# Patient Record
Sex: Female | Born: 1935 | Race: White | Hispanic: No | Smoking: Former smoker
Health system: Southern US, Community
[De-identification: ages and names within clinical notes are randomized; demographics above are authoritative.]

## PROBLEM LIST (undated history)

## (undated) DIAGNOSIS — M810 Age-related osteoporosis without current pathological fracture: Secondary | ICD-10-CM

## (undated) DIAGNOSIS — I10 Essential (primary) hypertension: Secondary | ICD-10-CM

## (undated) DIAGNOSIS — E785 Hyperlipidemia, unspecified: Secondary | ICD-10-CM

## (undated) DIAGNOSIS — F0390 Unspecified dementia without behavioral disturbance: Secondary | ICD-10-CM

## (undated) DIAGNOSIS — W19XXXA Unspecified fall, initial encounter: Secondary | ICD-10-CM

## (undated) DIAGNOSIS — H3530 Unspecified macular degeneration: Secondary | ICD-10-CM

---

## 1997-03-29 ENCOUNTER — Ambulatory Visit (HOSPITAL_COMMUNITY): Admission: RE | Admit: 1997-03-29 | Discharge: 1997-03-29 | Payer: Self-pay | Admitting: *Deleted

## 1997-08-25 ENCOUNTER — Ambulatory Visit (HOSPITAL_COMMUNITY): Admission: RE | Admit: 1997-08-25 | Discharge: 1997-08-25 | Payer: Self-pay | Admitting: Obstetrics & Gynecology

## 1998-08-24 ENCOUNTER — Ambulatory Visit (HOSPITAL_COMMUNITY): Admission: RE | Admit: 1998-08-24 | Discharge: 1998-08-24 | Payer: Self-pay

## 1998-08-24 ENCOUNTER — Ambulatory Visit (HOSPITAL_COMMUNITY): Admission: RE | Admit: 1998-08-24 | Discharge: 1998-08-24 | Payer: Self-pay | Admitting: Obstetrics & Gynecology

## 1999-08-30 ENCOUNTER — Ambulatory Visit (HOSPITAL_COMMUNITY): Admission: RE | Admit: 1999-08-30 | Discharge: 1999-08-30 | Payer: Self-pay | Admitting: Obstetrics & Gynecology

## 2000-09-01 ENCOUNTER — Ambulatory Visit (HOSPITAL_COMMUNITY): Admission: RE | Admit: 2000-09-01 | Discharge: 2000-09-01 | Payer: Self-pay | Admitting: Internal Medicine

## 2000-09-01 ENCOUNTER — Encounter: Payer: Self-pay | Admitting: Internal Medicine

## 2000-09-18 ENCOUNTER — Encounter: Admission: RE | Admit: 2000-09-18 | Discharge: 2000-09-18 | Payer: Self-pay | Admitting: Internal Medicine

## 2000-09-18 ENCOUNTER — Encounter: Payer: Self-pay | Admitting: Internal Medicine

## 2000-09-18 ENCOUNTER — Ambulatory Visit (HOSPITAL_COMMUNITY): Admission: RE | Admit: 2000-09-18 | Discharge: 2000-09-18 | Payer: Self-pay | Admitting: *Deleted

## 2001-10-01 ENCOUNTER — Ambulatory Visit (HOSPITAL_COMMUNITY): Admission: RE | Admit: 2001-10-01 | Discharge: 2001-10-01 | Payer: Self-pay

## 2002-10-07 ENCOUNTER — Ambulatory Visit (HOSPITAL_COMMUNITY): Admission: RE | Admit: 2002-10-07 | Discharge: 2002-10-07 | Payer: Self-pay | Admitting: *Deleted

## 2003-11-27 ENCOUNTER — Ambulatory Visit: Payer: Self-pay | Admitting: Internal Medicine

## 2003-12-08 ENCOUNTER — Ambulatory Visit: Payer: Self-pay

## 2004-05-07 ENCOUNTER — Ambulatory Visit: Payer: Self-pay | Admitting: Internal Medicine

## 2004-12-06 ENCOUNTER — Ambulatory Visit: Payer: Self-pay

## 2005-01-03 ENCOUNTER — Ambulatory Visit: Payer: Self-pay | Admitting: Internal Medicine

## 2005-11-26 ENCOUNTER — Ambulatory Visit: Payer: Self-pay | Admitting: Internal Medicine

## 2006-01-15 ENCOUNTER — Ambulatory Visit: Payer: Self-pay | Admitting: Internal Medicine

## 2006-01-26 ENCOUNTER — Ambulatory Visit: Payer: Self-pay | Admitting: Internal Medicine

## 2006-08-31 ENCOUNTER — Ambulatory Visit: Payer: Self-pay | Admitting: Internal Medicine

## 2007-06-03 ENCOUNTER — Ambulatory Visit: Payer: Self-pay | Admitting: Internal Medicine

## 2008-08-04 ENCOUNTER — Ambulatory Visit: Payer: Self-pay | Admitting: Internal Medicine

## 2010-03-23 ENCOUNTER — Inpatient Hospital Stay: Payer: Self-pay | Admitting: Orthopedic Surgery

## 2014-01-24 ENCOUNTER — Emergency Department: Payer: Self-pay | Admitting: Emergency Medicine

## 2014-01-24 LAB — BASIC METABOLIC PANEL
Anion Gap: 7 (ref 7–16)
BUN: 16 mg/dL (ref 7–18)
Calcium, Total: 7.3 mg/dL — ABNORMAL LOW (ref 8.5–10.1)
Chloride: 111 mmol/L — ABNORMAL HIGH (ref 98–107)
Co2: 25 mmol/L (ref 21–32)
Creatinine: 0.52 mg/dL — ABNORMAL LOW (ref 0.60–1.30)
EGFR (African American): >60
EGFR (Non-African Amer.): >60
Glucose: 117 mg/dL — ABNORMAL HIGH (ref 65–99)
Osmolality: 287 (ref 275–301)
Potassium: 4.5 mmol/L (ref 3.5–5.1)
Sodium: 143 mmol/L (ref 136–145)

## 2014-01-24 LAB — URINALYSIS, COMPLETE
Bilirubin,UR: NEGATIVE
Blood: NEGATIVE
Glucose,UR: NEGATIVE mg/dL (ref 0–75)
Ketone: NEGATIVE
Nitrite: POSITIVE
Ph: 7 (ref 4.5–8.0)
Protein: NEGATIVE
RBC,UR: 1 /HPF (ref 0–5)
Specific Gravity: 1.011 (ref 1.003–1.030)
Squamous Epithelial: <1
WBC UR: 21 /HPF (ref 0–5)

## 2014-01-24 LAB — CBC WITH DIFFERENTIAL/PLATELET
Basophil #: 0 10*3/uL (ref 0.0–0.1)
Basophil %: 0.4 %
Eosinophil #: 0.1 10*3/uL (ref 0.0–0.7)
Eosinophil %: 0.5 %
HCT: 41.5 % (ref 35.0–47.0)
HGB: 13.5 g/dL (ref 12.0–16.0)
Lymphocyte #: 1 10*3/uL (ref 1.0–3.6)
Lymphocyte %: 7.4 %
MCH: 31.2 pg (ref 26.0–34.0)
MCHC: 32.5 g/dL (ref 32.0–36.0)
MCV: 96 fL (ref 80–100)
Monocyte #: 0.7 x10 3/mm (ref 0.2–0.9)
Monocyte %: 5 %
Neutrophil #: 11.7 10*3/uL — ABNORMAL HIGH (ref 1.4–6.5)
Neutrophil %: 86.7 %
Platelet: 245 10*3/uL (ref 150–440)
RBC: 4.33 10*6/uL (ref 3.80–5.20)
RDW: 13.5 % (ref 11.5–14.5)
WBC: 13.5 10*3/uL — ABNORMAL HIGH (ref 3.6–11.0)

## 2014-01-24 LAB — TROPONIN I
Troponin-I: 0.03 ng/mL
Troponin-I: 0.03 ng/mL

## 2014-01-26 LAB — URINE CULTURE

## 2014-02-17 DIAGNOSIS — G933 Postviral fatigue syndrome: Secondary | ICD-10-CM | POA: Diagnosis not present

## 2014-02-18 DIAGNOSIS — G933 Postviral fatigue syndrome: Secondary | ICD-10-CM | POA: Diagnosis not present

## 2014-02-19 DIAGNOSIS — G933 Postviral fatigue syndrome: Secondary | ICD-10-CM | POA: Diagnosis not present

## 2014-02-20 DIAGNOSIS — G933 Postviral fatigue syndrome: Secondary | ICD-10-CM | POA: Diagnosis not present

## 2014-02-21 DIAGNOSIS — G933 Postviral fatigue syndrome: Secondary | ICD-10-CM | POA: Diagnosis not present

## 2014-02-22 DIAGNOSIS — G933 Postviral fatigue syndrome: Secondary | ICD-10-CM | POA: Diagnosis not present

## 2014-02-23 DIAGNOSIS — G933 Postviral fatigue syndrome: Secondary | ICD-10-CM | POA: Diagnosis not present

## 2014-02-24 DIAGNOSIS — S52531D Colles' fracture of right radius, subsequent encounter for closed fracture with routine healing: Secondary | ICD-10-CM | POA: Diagnosis not present

## 2014-02-24 DIAGNOSIS — G933 Postviral fatigue syndrome: Secondary | ICD-10-CM | POA: Diagnosis not present

## 2014-02-25 DIAGNOSIS — G933 Postviral fatigue syndrome: Secondary | ICD-10-CM | POA: Diagnosis not present

## 2014-02-26 DIAGNOSIS — G933 Postviral fatigue syndrome: Secondary | ICD-10-CM | POA: Diagnosis not present

## 2014-02-27 DIAGNOSIS — G933 Postviral fatigue syndrome: Secondary | ICD-10-CM | POA: Diagnosis not present

## 2014-02-28 DIAGNOSIS — G933 Postviral fatigue syndrome: Secondary | ICD-10-CM | POA: Diagnosis not present

## 2014-03-01 DIAGNOSIS — G933 Postviral fatigue syndrome: Secondary | ICD-10-CM | POA: Diagnosis not present

## 2014-03-02 DIAGNOSIS — G933 Postviral fatigue syndrome: Secondary | ICD-10-CM | POA: Diagnosis not present

## 2014-03-03 DIAGNOSIS — G933 Postviral fatigue syndrome: Secondary | ICD-10-CM | POA: Diagnosis not present

## 2014-03-04 DIAGNOSIS — G933 Postviral fatigue syndrome: Secondary | ICD-10-CM | POA: Diagnosis not present

## 2014-03-05 DIAGNOSIS — G933 Postviral fatigue syndrome: Secondary | ICD-10-CM | POA: Diagnosis not present

## 2014-03-06 DIAGNOSIS — G933 Postviral fatigue syndrome: Secondary | ICD-10-CM | POA: Diagnosis not present

## 2014-03-07 DIAGNOSIS — G933 Postviral fatigue syndrome: Secondary | ICD-10-CM | POA: Diagnosis not present

## 2014-03-08 DIAGNOSIS — G933 Postviral fatigue syndrome: Secondary | ICD-10-CM | POA: Diagnosis not present

## 2014-03-09 DIAGNOSIS — G933 Postviral fatigue syndrome: Secondary | ICD-10-CM | POA: Diagnosis not present

## 2014-03-10 DIAGNOSIS — G933 Postviral fatigue syndrome: Secondary | ICD-10-CM | POA: Diagnosis not present

## 2014-03-11 DIAGNOSIS — G933 Postviral fatigue syndrome: Secondary | ICD-10-CM | POA: Diagnosis not present

## 2014-03-12 DIAGNOSIS — G933 Postviral fatigue syndrome: Secondary | ICD-10-CM | POA: Diagnosis not present

## 2014-03-13 ENCOUNTER — Emergency Department: Payer: Self-pay | Admitting: Emergency Medicine

## 2014-03-13 DIAGNOSIS — I10 Essential (primary) hypertension: Secondary | ICD-10-CM | POA: Diagnosis not present

## 2014-03-13 DIAGNOSIS — G933 Postviral fatigue syndrome: Secondary | ICD-10-CM | POA: Diagnosis not present

## 2014-03-13 DIAGNOSIS — S2241XA Multiple fractures of ribs, right side, initial encounter for closed fracture: Secondary | ICD-10-CM | POA: Diagnosis not present

## 2014-03-14 DIAGNOSIS — G933 Postviral fatigue syndrome: Secondary | ICD-10-CM | POA: Diagnosis not present

## 2014-03-15 DIAGNOSIS — G933 Postviral fatigue syndrome: Secondary | ICD-10-CM | POA: Diagnosis not present

## 2014-03-16 DIAGNOSIS — G933 Postviral fatigue syndrome: Secondary | ICD-10-CM | POA: Diagnosis not present

## 2014-03-17 DIAGNOSIS — G933 Postviral fatigue syndrome: Secondary | ICD-10-CM | POA: Diagnosis not present

## 2014-03-18 DIAGNOSIS — G933 Postviral fatigue syndrome: Secondary | ICD-10-CM | POA: Diagnosis not present

## 2014-03-19 DIAGNOSIS — G933 Postviral fatigue syndrome: Secondary | ICD-10-CM | POA: Diagnosis not present

## 2014-03-20 DIAGNOSIS — G933 Postviral fatigue syndrome: Secondary | ICD-10-CM | POA: Diagnosis not present

## 2014-03-21 DIAGNOSIS — G933 Postviral fatigue syndrome: Secondary | ICD-10-CM | POA: Diagnosis not present

## 2014-03-22 DIAGNOSIS — G933 Postviral fatigue syndrome: Secondary | ICD-10-CM | POA: Diagnosis not present

## 2014-03-23 DIAGNOSIS — G933 Postviral fatigue syndrome: Secondary | ICD-10-CM | POA: Diagnosis not present

## 2014-03-24 DIAGNOSIS — G933 Postviral fatigue syndrome: Secondary | ICD-10-CM | POA: Diagnosis not present

## 2014-03-25 DIAGNOSIS — G933 Postviral fatigue syndrome: Secondary | ICD-10-CM | POA: Diagnosis not present

## 2014-03-26 DIAGNOSIS — G933 Postviral fatigue syndrome: Secondary | ICD-10-CM | POA: Diagnosis not present

## 2014-03-27 DIAGNOSIS — G933 Postviral fatigue syndrome: Secondary | ICD-10-CM | POA: Diagnosis not present

## 2014-03-28 DIAGNOSIS — G933 Postviral fatigue syndrome: Secondary | ICD-10-CM | POA: Diagnosis not present

## 2014-03-29 DIAGNOSIS — G933 Postviral fatigue syndrome: Secondary | ICD-10-CM | POA: Diagnosis not present

## 2014-03-30 DIAGNOSIS — G933 Postviral fatigue syndrome: Secondary | ICD-10-CM | POA: Diagnosis not present

## 2014-03-31 DIAGNOSIS — G933 Postviral fatigue syndrome: Secondary | ICD-10-CM | POA: Diagnosis not present

## 2014-04-01 DIAGNOSIS — G933 Postviral fatigue syndrome: Secondary | ICD-10-CM | POA: Diagnosis not present

## 2014-04-02 DIAGNOSIS — G933 Postviral fatigue syndrome: Secondary | ICD-10-CM | POA: Diagnosis not present

## 2014-04-03 DIAGNOSIS — G933 Postviral fatigue syndrome: Secondary | ICD-10-CM | POA: Diagnosis not present

## 2014-04-04 DIAGNOSIS — G933 Postviral fatigue syndrome: Secondary | ICD-10-CM | POA: Diagnosis not present

## 2014-04-05 DIAGNOSIS — G933 Postviral fatigue syndrome: Secondary | ICD-10-CM | POA: Diagnosis not present

## 2014-04-06 DIAGNOSIS — G933 Postviral fatigue syndrome: Secondary | ICD-10-CM | POA: Diagnosis not present

## 2014-04-07 DIAGNOSIS — G933 Postviral fatigue syndrome: Secondary | ICD-10-CM | POA: Diagnosis not present

## 2014-04-08 DIAGNOSIS — G933 Postviral fatigue syndrome: Secondary | ICD-10-CM | POA: Diagnosis not present

## 2014-04-09 DIAGNOSIS — G933 Postviral fatigue syndrome: Secondary | ICD-10-CM | POA: Diagnosis not present

## 2014-04-10 DIAGNOSIS — G933 Postviral fatigue syndrome: Secondary | ICD-10-CM | POA: Diagnosis not present

## 2014-04-11 DIAGNOSIS — G933 Postviral fatigue syndrome: Secondary | ICD-10-CM | POA: Diagnosis not present

## 2014-04-12 DIAGNOSIS — G933 Postviral fatigue syndrome: Secondary | ICD-10-CM | POA: Diagnosis not present

## 2014-04-13 DIAGNOSIS — G933 Postviral fatigue syndrome: Secondary | ICD-10-CM | POA: Diagnosis not present

## 2014-04-14 DIAGNOSIS — S52531D Colles' fracture of right radius, subsequent encounter for closed fracture with routine healing: Secondary | ICD-10-CM | POA: Diagnosis not present

## 2014-04-14 DIAGNOSIS — G933 Postviral fatigue syndrome: Secondary | ICD-10-CM | POA: Diagnosis not present

## 2014-04-15 DIAGNOSIS — G933 Postviral fatigue syndrome: Secondary | ICD-10-CM | POA: Diagnosis not present

## 2014-04-16 DIAGNOSIS — G933 Postviral fatigue syndrome: Secondary | ICD-10-CM | POA: Diagnosis not present

## 2014-04-17 DIAGNOSIS — G933 Postviral fatigue syndrome: Secondary | ICD-10-CM | POA: Diagnosis not present

## 2014-04-18 DIAGNOSIS — G933 Postviral fatigue syndrome: Secondary | ICD-10-CM | POA: Diagnosis not present

## 2014-04-19 DIAGNOSIS — G933 Postviral fatigue syndrome: Secondary | ICD-10-CM | POA: Diagnosis not present

## 2014-04-20 DIAGNOSIS — E039 Hypothyroidism, unspecified: Secondary | ICD-10-CM | POA: Diagnosis not present

## 2014-04-20 DIAGNOSIS — I10 Essential (primary) hypertension: Secondary | ICD-10-CM | POA: Diagnosis not present

## 2014-04-20 DIAGNOSIS — F0390 Unspecified dementia without behavioral disturbance: Secondary | ICD-10-CM | POA: Diagnosis not present

## 2014-04-20 DIAGNOSIS — E782 Mixed hyperlipidemia: Secondary | ICD-10-CM | POA: Diagnosis not present

## 2014-04-20 DIAGNOSIS — M810 Age-related osteoporosis without current pathological fracture: Secondary | ICD-10-CM | POA: Diagnosis not present

## 2014-04-20 DIAGNOSIS — G933 Postviral fatigue syndrome: Secondary | ICD-10-CM | POA: Diagnosis not present

## 2014-04-21 DIAGNOSIS — G933 Postviral fatigue syndrome: Secondary | ICD-10-CM | POA: Diagnosis not present

## 2014-04-22 DIAGNOSIS — G933 Postviral fatigue syndrome: Secondary | ICD-10-CM | POA: Diagnosis not present

## 2014-04-23 DIAGNOSIS — G933 Postviral fatigue syndrome: Secondary | ICD-10-CM | POA: Diagnosis not present

## 2014-04-24 DIAGNOSIS — G933 Postviral fatigue syndrome: Secondary | ICD-10-CM | POA: Diagnosis not present

## 2014-04-25 DIAGNOSIS — G933 Postviral fatigue syndrome: Secondary | ICD-10-CM | POA: Diagnosis not present

## 2014-04-26 DIAGNOSIS — G933 Postviral fatigue syndrome: Secondary | ICD-10-CM | POA: Diagnosis not present

## 2014-04-27 DIAGNOSIS — G933 Postviral fatigue syndrome: Secondary | ICD-10-CM | POA: Diagnosis not present

## 2014-04-27 DIAGNOSIS — H3530 Unspecified macular degeneration: Secondary | ICD-10-CM | POA: Diagnosis not present

## 2014-04-27 DIAGNOSIS — I10 Essential (primary) hypertension: Secondary | ICD-10-CM | POA: Diagnosis not present

## 2014-04-27 DIAGNOSIS — F0390 Unspecified dementia without behavioral disturbance: Secondary | ICD-10-CM | POA: Diagnosis not present

## 2014-04-27 DIAGNOSIS — E039 Hypothyroidism, unspecified: Secondary | ICD-10-CM | POA: Diagnosis not present

## 2014-04-28 DIAGNOSIS — G933 Postviral fatigue syndrome: Secondary | ICD-10-CM | POA: Diagnosis not present

## 2014-04-29 DIAGNOSIS — G933 Postviral fatigue syndrome: Secondary | ICD-10-CM | POA: Diagnosis not present

## 2014-04-30 DIAGNOSIS — G933 Postviral fatigue syndrome: Secondary | ICD-10-CM | POA: Diagnosis not present

## 2014-05-01 DIAGNOSIS — G933 Postviral fatigue syndrome: Secondary | ICD-10-CM | POA: Diagnosis not present

## 2014-05-02 DIAGNOSIS — G933 Postviral fatigue syndrome: Secondary | ICD-10-CM | POA: Diagnosis not present

## 2014-05-03 DIAGNOSIS — G933 Postviral fatigue syndrome: Secondary | ICD-10-CM | POA: Diagnosis not present

## 2014-05-04 DIAGNOSIS — G933 Postviral fatigue syndrome: Secondary | ICD-10-CM | POA: Diagnosis not present

## 2014-05-05 DIAGNOSIS — F0390 Unspecified dementia without behavioral disturbance: Secondary | ICD-10-CM | POA: Diagnosis not present

## 2014-05-05 DIAGNOSIS — G933 Postviral fatigue syndrome: Secondary | ICD-10-CM | POA: Diagnosis not present

## 2014-05-05 DIAGNOSIS — R32 Unspecified urinary incontinence: Secondary | ICD-10-CM | POA: Diagnosis not present

## 2014-05-06 DIAGNOSIS — G933 Postviral fatigue syndrome: Secondary | ICD-10-CM | POA: Diagnosis not present

## 2014-05-07 DIAGNOSIS — G933 Postviral fatigue syndrome: Secondary | ICD-10-CM | POA: Diagnosis not present

## 2014-05-08 DIAGNOSIS — G933 Postviral fatigue syndrome: Secondary | ICD-10-CM | POA: Diagnosis not present

## 2014-05-09 DIAGNOSIS — G933 Postviral fatigue syndrome: Secondary | ICD-10-CM | POA: Diagnosis not present

## 2014-05-10 DIAGNOSIS — G933 Postviral fatigue syndrome: Secondary | ICD-10-CM | POA: Diagnosis not present

## 2014-05-11 DIAGNOSIS — G933 Postviral fatigue syndrome: Secondary | ICD-10-CM | POA: Diagnosis not present

## 2014-05-12 DIAGNOSIS — G933 Postviral fatigue syndrome: Secondary | ICD-10-CM | POA: Diagnosis not present

## 2014-05-13 DIAGNOSIS — G933 Postviral fatigue syndrome: Secondary | ICD-10-CM | POA: Diagnosis not present

## 2014-05-14 DIAGNOSIS — G933 Postviral fatigue syndrome: Secondary | ICD-10-CM | POA: Diagnosis not present

## 2014-05-15 DIAGNOSIS — G933 Postviral fatigue syndrome: Secondary | ICD-10-CM | POA: Diagnosis not present

## 2014-05-16 DIAGNOSIS — G933 Postviral fatigue syndrome: Secondary | ICD-10-CM | POA: Diagnosis not present

## 2014-05-17 DIAGNOSIS — G933 Postviral fatigue syndrome: Secondary | ICD-10-CM | POA: Diagnosis not present

## 2014-05-18 DIAGNOSIS — G933 Postviral fatigue syndrome: Secondary | ICD-10-CM | POA: Diagnosis not present

## 2014-05-19 DIAGNOSIS — G933 Postviral fatigue syndrome: Secondary | ICD-10-CM | POA: Diagnosis not present

## 2014-05-20 DIAGNOSIS — G933 Postviral fatigue syndrome: Secondary | ICD-10-CM | POA: Diagnosis not present

## 2014-05-21 DIAGNOSIS — G933 Postviral fatigue syndrome: Secondary | ICD-10-CM | POA: Diagnosis not present

## 2014-05-22 DIAGNOSIS — G933 Postviral fatigue syndrome: Secondary | ICD-10-CM | POA: Diagnosis not present

## 2014-05-23 DIAGNOSIS — G933 Postviral fatigue syndrome: Secondary | ICD-10-CM | POA: Diagnosis not present

## 2014-05-24 DIAGNOSIS — G933 Postviral fatigue syndrome: Secondary | ICD-10-CM | POA: Diagnosis not present

## 2014-05-25 DIAGNOSIS — G933 Postviral fatigue syndrome: Secondary | ICD-10-CM | POA: Diagnosis not present

## 2014-05-26 DIAGNOSIS — G933 Postviral fatigue syndrome: Secondary | ICD-10-CM | POA: Diagnosis not present

## 2014-05-27 DIAGNOSIS — G933 Postviral fatigue syndrome: Secondary | ICD-10-CM | POA: Diagnosis not present

## 2014-05-28 DIAGNOSIS — G933 Postviral fatigue syndrome: Secondary | ICD-10-CM | POA: Diagnosis not present

## 2014-05-29 DIAGNOSIS — G933 Postviral fatigue syndrome: Secondary | ICD-10-CM | POA: Diagnosis not present

## 2014-05-30 DIAGNOSIS — G933 Postviral fatigue syndrome: Secondary | ICD-10-CM | POA: Diagnosis not present

## 2014-05-31 DIAGNOSIS — G933 Postviral fatigue syndrome: Secondary | ICD-10-CM | POA: Diagnosis not present

## 2014-06-01 DIAGNOSIS — G933 Postviral fatigue syndrome: Secondary | ICD-10-CM | POA: Diagnosis not present

## 2014-06-02 DIAGNOSIS — G933 Postviral fatigue syndrome: Secondary | ICD-10-CM | POA: Diagnosis not present

## 2014-06-03 DIAGNOSIS — G933 Postviral fatigue syndrome: Secondary | ICD-10-CM | POA: Diagnosis not present

## 2014-06-04 DIAGNOSIS — G933 Postviral fatigue syndrome: Secondary | ICD-10-CM | POA: Diagnosis not present

## 2014-06-05 DIAGNOSIS — G933 Postviral fatigue syndrome: Secondary | ICD-10-CM | POA: Diagnosis not present

## 2014-06-06 DIAGNOSIS — G933 Postviral fatigue syndrome: Secondary | ICD-10-CM | POA: Diagnosis not present

## 2014-06-07 DIAGNOSIS — G933 Postviral fatigue syndrome: Secondary | ICD-10-CM | POA: Diagnosis not present

## 2014-06-08 DIAGNOSIS — G933 Postviral fatigue syndrome: Secondary | ICD-10-CM | POA: Diagnosis not present

## 2014-06-09 DIAGNOSIS — G933 Postviral fatigue syndrome: Secondary | ICD-10-CM | POA: Diagnosis not present

## 2014-06-10 DIAGNOSIS — G933 Postviral fatigue syndrome: Secondary | ICD-10-CM | POA: Diagnosis not present

## 2014-06-11 DIAGNOSIS — G933 Postviral fatigue syndrome: Secondary | ICD-10-CM | POA: Diagnosis not present

## 2014-06-12 DIAGNOSIS — G933 Postviral fatigue syndrome: Secondary | ICD-10-CM | POA: Diagnosis not present

## 2014-06-12 DIAGNOSIS — N390 Urinary tract infection, site not specified: Secondary | ICD-10-CM | POA: Diagnosis not present

## 2014-06-12 DIAGNOSIS — R410 Disorientation, unspecified: Secondary | ICD-10-CM | POA: Diagnosis not present

## 2014-06-13 DIAGNOSIS — G933 Postviral fatigue syndrome: Secondary | ICD-10-CM | POA: Diagnosis not present

## 2014-06-14 DIAGNOSIS — G933 Postviral fatigue syndrome: Secondary | ICD-10-CM | POA: Diagnosis not present

## 2014-06-15 DIAGNOSIS — G933 Postviral fatigue syndrome: Secondary | ICD-10-CM | POA: Diagnosis not present

## 2014-06-16 DIAGNOSIS — G933 Postviral fatigue syndrome: Secondary | ICD-10-CM | POA: Diagnosis not present

## 2014-06-17 DIAGNOSIS — G933 Postviral fatigue syndrome: Secondary | ICD-10-CM | POA: Diagnosis not present

## 2014-06-18 DIAGNOSIS — G933 Postviral fatigue syndrome: Secondary | ICD-10-CM | POA: Diagnosis not present

## 2014-06-19 DIAGNOSIS — G933 Postviral fatigue syndrome: Secondary | ICD-10-CM | POA: Diagnosis not present

## 2014-06-20 DIAGNOSIS — G933 Postviral fatigue syndrome: Secondary | ICD-10-CM | POA: Diagnosis not present

## 2014-06-21 DIAGNOSIS — G933 Postviral fatigue syndrome: Secondary | ICD-10-CM | POA: Diagnosis not present

## 2014-06-22 DIAGNOSIS — G933 Postviral fatigue syndrome: Secondary | ICD-10-CM | POA: Diagnosis not present

## 2014-06-23 DIAGNOSIS — G933 Postviral fatigue syndrome: Secondary | ICD-10-CM | POA: Diagnosis not present

## 2014-06-24 DIAGNOSIS — G933 Postviral fatigue syndrome: Secondary | ICD-10-CM | POA: Diagnosis not present

## 2014-06-25 DIAGNOSIS — G933 Postviral fatigue syndrome: Secondary | ICD-10-CM | POA: Diagnosis not present

## 2014-06-26 DIAGNOSIS — G933 Postviral fatigue syndrome: Secondary | ICD-10-CM | POA: Diagnosis not present

## 2014-06-27 DIAGNOSIS — G933 Postviral fatigue syndrome: Secondary | ICD-10-CM | POA: Diagnosis not present

## 2014-06-28 DIAGNOSIS — G933 Postviral fatigue syndrome: Secondary | ICD-10-CM | POA: Diagnosis not present

## 2014-06-29 DIAGNOSIS — G933 Postviral fatigue syndrome: Secondary | ICD-10-CM | POA: Diagnosis not present

## 2014-06-30 DIAGNOSIS — G933 Postviral fatigue syndrome: Secondary | ICD-10-CM | POA: Diagnosis not present

## 2014-06-30 DIAGNOSIS — H3531 Nonexudative age-related macular degeneration: Secondary | ICD-10-CM | POA: Diagnosis not present

## 2014-07-01 DIAGNOSIS — G933 Postviral fatigue syndrome: Secondary | ICD-10-CM | POA: Diagnosis not present

## 2014-07-02 DIAGNOSIS — G933 Postviral fatigue syndrome: Secondary | ICD-10-CM | POA: Diagnosis not present

## 2014-07-03 DIAGNOSIS — G933 Postviral fatigue syndrome: Secondary | ICD-10-CM | POA: Diagnosis not present

## 2014-07-04 DIAGNOSIS — G933 Postviral fatigue syndrome: Secondary | ICD-10-CM | POA: Diagnosis not present

## 2014-07-05 DIAGNOSIS — G933 Postviral fatigue syndrome: Secondary | ICD-10-CM | POA: Diagnosis not present

## 2014-07-06 DIAGNOSIS — G933 Postviral fatigue syndrome: Secondary | ICD-10-CM | POA: Diagnosis not present

## 2014-07-07 DIAGNOSIS — G933 Postviral fatigue syndrome: Secondary | ICD-10-CM | POA: Diagnosis not present

## 2014-07-08 DIAGNOSIS — G933 Postviral fatigue syndrome: Secondary | ICD-10-CM | POA: Diagnosis not present

## 2014-07-09 DIAGNOSIS — G933 Postviral fatigue syndrome: Secondary | ICD-10-CM | POA: Diagnosis not present

## 2014-07-10 DIAGNOSIS — G933 Postviral fatigue syndrome: Secondary | ICD-10-CM | POA: Diagnosis not present

## 2014-07-11 DIAGNOSIS — G933 Postviral fatigue syndrome: Secondary | ICD-10-CM | POA: Diagnosis not present

## 2014-07-12 DIAGNOSIS — G933 Postviral fatigue syndrome: Secondary | ICD-10-CM | POA: Diagnosis not present

## 2014-07-13 DIAGNOSIS — G933 Postviral fatigue syndrome: Secondary | ICD-10-CM | POA: Diagnosis not present

## 2014-07-14 DIAGNOSIS — G933 Postviral fatigue syndrome: Secondary | ICD-10-CM | POA: Diagnosis not present

## 2014-07-15 DIAGNOSIS — G933 Postviral fatigue syndrome: Secondary | ICD-10-CM | POA: Diagnosis not present

## 2014-07-16 DIAGNOSIS — G933 Postviral fatigue syndrome: Secondary | ICD-10-CM | POA: Diagnosis not present

## 2014-07-17 DIAGNOSIS — G933 Postviral fatigue syndrome: Secondary | ICD-10-CM | POA: Diagnosis not present

## 2014-07-18 DIAGNOSIS — G933 Postviral fatigue syndrome: Secondary | ICD-10-CM | POA: Diagnosis not present

## 2014-07-19 DIAGNOSIS — G933 Postviral fatigue syndrome: Secondary | ICD-10-CM | POA: Diagnosis not present

## 2014-07-20 DIAGNOSIS — G933 Postviral fatigue syndrome: Secondary | ICD-10-CM | POA: Diagnosis not present

## 2014-07-21 DIAGNOSIS — G933 Postviral fatigue syndrome: Secondary | ICD-10-CM | POA: Diagnosis not present

## 2014-07-22 DIAGNOSIS — G933 Postviral fatigue syndrome: Secondary | ICD-10-CM | POA: Diagnosis not present

## 2014-07-23 DIAGNOSIS — G933 Postviral fatigue syndrome: Secondary | ICD-10-CM | POA: Diagnosis not present

## 2014-07-24 DIAGNOSIS — G933 Postviral fatigue syndrome: Secondary | ICD-10-CM | POA: Diagnosis not present

## 2014-07-25 DIAGNOSIS — G933 Postviral fatigue syndrome: Secondary | ICD-10-CM | POA: Diagnosis not present

## 2014-07-26 DIAGNOSIS — G933 Postviral fatigue syndrome: Secondary | ICD-10-CM | POA: Diagnosis not present

## 2014-07-27 DIAGNOSIS — G933 Postviral fatigue syndrome: Secondary | ICD-10-CM | POA: Diagnosis not present

## 2014-07-28 DIAGNOSIS — G933 Postviral fatigue syndrome: Secondary | ICD-10-CM | POA: Diagnosis not present

## 2014-07-29 DIAGNOSIS — G933 Postviral fatigue syndrome: Secondary | ICD-10-CM | POA: Diagnosis not present

## 2014-07-30 DIAGNOSIS — G933 Postviral fatigue syndrome: Secondary | ICD-10-CM | POA: Diagnosis not present

## 2014-07-31 DIAGNOSIS — G933 Postviral fatigue syndrome: Secondary | ICD-10-CM | POA: Diagnosis not present

## 2014-08-01 DIAGNOSIS — G933 Postviral fatigue syndrome: Secondary | ICD-10-CM | POA: Diagnosis not present

## 2014-08-02 DIAGNOSIS — G933 Postviral fatigue syndrome: Secondary | ICD-10-CM | POA: Diagnosis not present

## 2014-08-02 DIAGNOSIS — Z209 Contact with and (suspected) exposure to unspecified communicable disease: Secondary | ICD-10-CM | POA: Diagnosis not present

## 2014-08-03 DIAGNOSIS — G933 Postviral fatigue syndrome: Secondary | ICD-10-CM | POA: Diagnosis not present

## 2014-08-04 DIAGNOSIS — G933 Postviral fatigue syndrome: Secondary | ICD-10-CM | POA: Diagnosis not present

## 2014-08-05 DIAGNOSIS — G933 Postviral fatigue syndrome: Secondary | ICD-10-CM | POA: Diagnosis not present

## 2014-08-06 DIAGNOSIS — G933 Postviral fatigue syndrome: Secondary | ICD-10-CM | POA: Diagnosis not present

## 2014-08-07 DIAGNOSIS — G933 Postviral fatigue syndrome: Secondary | ICD-10-CM | POA: Diagnosis not present

## 2014-08-08 DIAGNOSIS — G933 Postviral fatigue syndrome: Secondary | ICD-10-CM | POA: Diagnosis not present

## 2014-08-08 DIAGNOSIS — R32 Unspecified urinary incontinence: Secondary | ICD-10-CM | POA: Diagnosis not present

## 2014-08-08 DIAGNOSIS — F0390 Unspecified dementia without behavioral disturbance: Secondary | ICD-10-CM | POA: Diagnosis not present

## 2014-08-09 DIAGNOSIS — G933 Postviral fatigue syndrome: Secondary | ICD-10-CM | POA: Diagnosis not present

## 2014-08-10 DIAGNOSIS — G933 Postviral fatigue syndrome: Secondary | ICD-10-CM | POA: Diagnosis not present

## 2014-08-11 DIAGNOSIS — G933 Postviral fatigue syndrome: Secondary | ICD-10-CM | POA: Diagnosis not present

## 2014-08-12 DIAGNOSIS — G933 Postviral fatigue syndrome: Secondary | ICD-10-CM | POA: Diagnosis not present

## 2014-08-13 DIAGNOSIS — G933 Postviral fatigue syndrome: Secondary | ICD-10-CM | POA: Diagnosis not present

## 2014-08-14 DIAGNOSIS — G933 Postviral fatigue syndrome: Secondary | ICD-10-CM | POA: Diagnosis not present

## 2014-08-15 DIAGNOSIS — G933 Postviral fatigue syndrome: Secondary | ICD-10-CM | POA: Diagnosis not present

## 2014-08-16 DIAGNOSIS — G933 Postviral fatigue syndrome: Secondary | ICD-10-CM | POA: Diagnosis not present

## 2014-08-17 DIAGNOSIS — G933 Postviral fatigue syndrome: Secondary | ICD-10-CM | POA: Diagnosis not present

## 2014-08-18 DIAGNOSIS — G933 Postviral fatigue syndrome: Secondary | ICD-10-CM | POA: Diagnosis not present

## 2014-08-19 DIAGNOSIS — G933 Postviral fatigue syndrome: Secondary | ICD-10-CM | POA: Diagnosis not present

## 2014-08-20 DIAGNOSIS — G933 Postviral fatigue syndrome: Secondary | ICD-10-CM | POA: Diagnosis not present

## 2014-08-21 DIAGNOSIS — G933 Postviral fatigue syndrome: Secondary | ICD-10-CM | POA: Diagnosis not present

## 2014-08-22 DIAGNOSIS — G933 Postviral fatigue syndrome: Secondary | ICD-10-CM | POA: Diagnosis not present

## 2014-08-23 DIAGNOSIS — G933 Postviral fatigue syndrome: Secondary | ICD-10-CM | POA: Diagnosis not present

## 2014-08-24 DIAGNOSIS — G933 Postviral fatigue syndrome: Secondary | ICD-10-CM | POA: Diagnosis not present

## 2014-08-25 DIAGNOSIS — G933 Postviral fatigue syndrome: Secondary | ICD-10-CM | POA: Diagnosis not present

## 2014-08-26 DIAGNOSIS — G933 Postviral fatigue syndrome: Secondary | ICD-10-CM | POA: Diagnosis not present

## 2014-08-27 DIAGNOSIS — G933 Postviral fatigue syndrome: Secondary | ICD-10-CM | POA: Diagnosis not present

## 2014-08-28 DIAGNOSIS — G933 Postviral fatigue syndrome: Secondary | ICD-10-CM | POA: Diagnosis not present

## 2014-08-29 DIAGNOSIS — G933 Postviral fatigue syndrome: Secondary | ICD-10-CM | POA: Diagnosis not present

## 2014-08-29 DIAGNOSIS — F0390 Unspecified dementia without behavioral disturbance: Secondary | ICD-10-CM | POA: Diagnosis not present

## 2014-08-29 DIAGNOSIS — E782 Mixed hyperlipidemia: Secondary | ICD-10-CM | POA: Diagnosis not present

## 2014-08-29 DIAGNOSIS — R05 Cough: Secondary | ICD-10-CM | POA: Diagnosis not present

## 2014-08-29 DIAGNOSIS — R0689 Other abnormalities of breathing: Secondary | ICD-10-CM | POA: Diagnosis not present

## 2014-08-29 DIAGNOSIS — I10 Essential (primary) hypertension: Secondary | ICD-10-CM | POA: Diagnosis not present

## 2014-08-29 DIAGNOSIS — D649 Anemia, unspecified: Secondary | ICD-10-CM | POA: Diagnosis not present

## 2014-08-30 DIAGNOSIS — G933 Postviral fatigue syndrome: Secondary | ICD-10-CM | POA: Diagnosis not present

## 2014-08-31 DIAGNOSIS — G933 Postviral fatigue syndrome: Secondary | ICD-10-CM | POA: Diagnosis not present

## 2014-09-01 DIAGNOSIS — G933 Postviral fatigue syndrome: Secondary | ICD-10-CM | POA: Diagnosis not present

## 2014-09-02 DIAGNOSIS — G933 Postviral fatigue syndrome: Secondary | ICD-10-CM | POA: Diagnosis not present

## 2014-09-03 DIAGNOSIS — G933 Postviral fatigue syndrome: Secondary | ICD-10-CM | POA: Diagnosis not present

## 2014-09-04 DIAGNOSIS — G933 Postviral fatigue syndrome: Secondary | ICD-10-CM | POA: Diagnosis not present

## 2014-09-05 DIAGNOSIS — G933 Postviral fatigue syndrome: Secondary | ICD-10-CM | POA: Diagnosis not present

## 2014-09-06 DIAGNOSIS — G933 Postviral fatigue syndrome: Secondary | ICD-10-CM | POA: Diagnosis not present

## 2014-09-07 DIAGNOSIS — G933 Postviral fatigue syndrome: Secondary | ICD-10-CM | POA: Diagnosis not present

## 2014-09-08 DIAGNOSIS — F0390 Unspecified dementia without behavioral disturbance: Secondary | ICD-10-CM | POA: Diagnosis not present

## 2014-09-08 DIAGNOSIS — R32 Unspecified urinary incontinence: Secondary | ICD-10-CM | POA: Diagnosis not present

## 2014-09-08 DIAGNOSIS — G933 Postviral fatigue syndrome: Secondary | ICD-10-CM | POA: Diagnosis not present

## 2014-09-09 DIAGNOSIS — G933 Postviral fatigue syndrome: Secondary | ICD-10-CM | POA: Diagnosis not present

## 2014-09-10 DIAGNOSIS — G933 Postviral fatigue syndrome: Secondary | ICD-10-CM | POA: Diagnosis not present

## 2014-09-11 DIAGNOSIS — G933 Postviral fatigue syndrome: Secondary | ICD-10-CM | POA: Diagnosis not present

## 2014-09-12 DIAGNOSIS — G933 Postviral fatigue syndrome: Secondary | ICD-10-CM | POA: Diagnosis not present

## 2014-09-13 DIAGNOSIS — G933 Postviral fatigue syndrome: Secondary | ICD-10-CM | POA: Diagnosis not present

## 2014-09-14 DIAGNOSIS — G933 Postviral fatigue syndrome: Secondary | ICD-10-CM | POA: Diagnosis not present

## 2014-09-15 DIAGNOSIS — G933 Postviral fatigue syndrome: Secondary | ICD-10-CM | POA: Diagnosis not present

## 2014-09-16 DIAGNOSIS — G933 Postviral fatigue syndrome: Secondary | ICD-10-CM | POA: Diagnosis not present

## 2014-09-17 DIAGNOSIS — G933 Postviral fatigue syndrome: Secondary | ICD-10-CM | POA: Diagnosis not present

## 2014-09-18 DIAGNOSIS — G933 Postviral fatigue syndrome: Secondary | ICD-10-CM | POA: Diagnosis not present

## 2014-09-19 DIAGNOSIS — G933 Postviral fatigue syndrome: Secondary | ICD-10-CM | POA: Diagnosis not present

## 2014-09-20 DIAGNOSIS — G933 Postviral fatigue syndrome: Secondary | ICD-10-CM | POA: Diagnosis not present

## 2014-09-21 DIAGNOSIS — G933 Postviral fatigue syndrome: Secondary | ICD-10-CM | POA: Diagnosis not present

## 2014-09-22 ENCOUNTER — Emergency Department: Payer: Commercial Managed Care - HMO

## 2014-09-22 ENCOUNTER — Emergency Department
Admission: EM | Admit: 2014-09-22 | Discharge: 2014-09-22 | Disposition: A | Discharge status: Home / Self Care | Payer: Commercial Managed Care - HMO | Attending: Emergency Medicine | Admitting: Emergency Medicine

## 2014-09-22 ENCOUNTER — Encounter: Payer: Self-pay | Admitting: Emergency Medicine

## 2014-09-22 DIAGNOSIS — Y998 Other external cause status: Secondary | ICD-10-CM | POA: Diagnosis not present

## 2014-09-22 DIAGNOSIS — Y9389 Activity, other specified: Secondary | ICD-10-CM | POA: Diagnosis not present

## 2014-09-22 DIAGNOSIS — S79912A Unspecified injury of left hip, initial encounter: Secondary | ICD-10-CM | POA: Insufficient documentation

## 2014-09-22 DIAGNOSIS — W1839XA Other fall on same level, initial encounter: Secondary | ICD-10-CM | POA: Diagnosis not present

## 2014-09-22 DIAGNOSIS — R102 Pelvic and perineal pain: Secondary | ICD-10-CM | POA: Diagnosis not present

## 2014-09-22 DIAGNOSIS — Y92192 Bathroom in other specified residential institution as the place of occurrence of the external cause: Secondary | ICD-10-CM | POA: Insufficient documentation

## 2014-09-22 DIAGNOSIS — M545 Low back pain: Secondary | ICD-10-CM | POA: Diagnosis not present

## 2014-09-22 DIAGNOSIS — M533 Sacrococcygeal disorders, not elsewhere classified: Secondary | ICD-10-CM | POA: Diagnosis not present

## 2014-09-22 DIAGNOSIS — W19XXXA Unspecified fall, initial encounter: Secondary | ICD-10-CM

## 2014-09-22 DIAGNOSIS — S3992XA Unspecified injury of lower back, initial encounter: Secondary | ICD-10-CM | POA: Diagnosis not present

## 2014-09-22 DIAGNOSIS — G933 Postviral fatigue syndrome: Secondary | ICD-10-CM | POA: Diagnosis not present

## 2014-09-22 HISTORY — DX: Age-related osteoporosis without current pathological fracture: M81.0

## 2014-09-22 HISTORY — DX: Unspecified fall, initial encounter: W19.XXXA

## 2014-09-22 HISTORY — DX: Hyperlipidemia, unspecified: E78.5

## 2014-09-22 HISTORY — DX: Unspecified dementia, unspecified severity, without behavioral disturbance, psychotic disturbance, mood disturbance, and anxiety: F03.90

## 2014-09-22 HISTORY — DX: Unspecified macular degeneration: H35.30

## 2014-09-22 HISTORY — DX: Essential (primary) hypertension: I10

## 2014-09-22 MED ORDER — TRAMADOL HCL 50 MG PO TABS: 50.0000 mg | ORAL_TABLET | Freq: Four times a day (QID) | ORAL | Status: AC | PRN

## 2014-09-22 NOTE — ED Provider Notes (Addendum)
[facility] Emergency Department Provider Note     Time seen: ----------------------------------------- 10:46 AM on 09/22/2014 -----------------------------------------    I have reviewed the triage vital signs and the nursing notes.   HISTORY  Chief Complaint Fall and Pelvic Pain    HPI Sarah [name] is a 81 y.o. female who presents ER for buttock pain. Patient states she fell on Wednesday where she was up in the night trying to get to the bathroom. She states after use the bathroom she fell just before she reached her bed, did not give week and fall, was a mechanical fall. Complains of pain in her sacrum and left hip posteriorly. Patient states it hurts when she sits on her tailbone. Denies any other complaints or injuries from the fall.   No past medical history on file.  There are no active problems to display for this patient.   No past surgical history on file.  Allergies Review of patient's allergies indicates not on file.  Social History History  Substance Use Topics  . Smoking status: Not on file  . Smokeless tobacco: Not on file  . Alcohol Use: Not on file    Review of Systems Constitutional: Negative for fever. Eyes: Negative for visual changes. ENT: Negative for sore throat. Cardiovascular: Negative for chest pain. Respiratory: Negative for shortness of breath. Gastrointestinal: Negative for abdominal pain, vomiting and diarrhea. Genitourinary: Negative for dysuria. Musculoskeletal: Positive for sacral and posterior left hip pain Skin: Negative for rash. Neurological: Negative for headaches, focal weakness or numbness.  10-point ROS otherwise negative.  ____________________________________________   PHYSICAL EXAM:  VITAL SIGNS: ED Triage Vitals  Enc Vitals Group     BP 09/22/14 1045 168/67 mmHg     Pulse Rate 09/22/14 1045 61     Resp 09/22/14 1045 18     Temp 09/22/14 1045 97.6 F (36.4 C)     Temp Source  09/22/14 1045 Oral     SpO2 09/22/14 1045 100 %     Weight 09/22/14 1045 155 lb (70.308 kg)     Height 09/22/14 1045 '5\' 5"'$  (1.651 m)     Head Cir --      Peak Flow --      Pain Score --      Pain Loc --      Pain Edu? --      Excl. in [place]? --     Constitutional: Alert and oriented. Well appearing and in no distress. Eyes: Conjunctivae are normal. PERRL. Normal extraocular movements. ENT   Head: Normocephalic and atraumatic.   Nose: No congestion/rhinnorhea.   Mouth/Throat: Mucous membranes are moist.   Neck: No stridor. Cardiovascular: Normal rate, regular rhythm. Normal and symmetric distal pulses are present in all extremities. No murmurs, rubs, or gallops. Respiratory: Normal respiratory effort without tachypnea nor retractions. Breath sounds are clear and equal bilaterally. No wheezes/rales/rhonchi. Gastrointestinal: Soft and nontender. No distention. No abdominal bruits. Left buttock tenderness, left sacral tenderness. Musculoskeletal: Nontender with normal range of motion in all extremities. No joint effusions.  No lower extremity tenderness nor edema. Neurologic:  Normal speech and language. No gross focal neurologic deficits are appreciated. Speech is normal. No gait instability. Skin:  Skin is warm, dry and intact. No rash noted. Psychiatric: Mood and affect are normal. Speech and behavior are normal. Patient exhibits appropriate insight and judgment.  ____________________________________________  ED COURSE:  Pertinent labs & imaging results that were available during my care of the patient were reviewed by me  and considered in my medical decision making (see chart for details). Patient will need pelvis x-rays and reevaluation. ____________________________________________   RADIOLOGY Images were viewed by me  Left hip and pelvis FINDINGS: Changes of old internal fixation within the left femur. Mild degenerative changes in the hips bilaterally which are  symmetric. SI joints are symmetric and unremarkable. No acute fracture, subluxation or dislocation.  IMPRESSION: No acute bony abnormality. ____________________________________________  FINAL ASSESSMENT AND PLAN  Fall, sacral pain  Plan: Patient with labs and imaging as dictated above. Patient with a bruised sacrum and possibly coccyx as well. X-rays were unremarkable. Patient able to ambulate, will discharge with pain medication as needed   Earleen Newport, MD   Earleen Newport, MD 09/22/14 [place], MD 09/22/14 [phone]

## 2014-09-22 NOTE — ED Notes (Signed)
Patient transported to X-ray 

## 2014-09-22 NOTE — ED Notes (Signed)
MD at bedside. 

## 2014-09-22 NOTE — ED Notes (Signed)
Patient picked up by facility representative rather than EMS transport.

## 2014-09-22 NOTE — Discharge Instructions (Signed)
Fall Prevention and Home Safety Falls cause injuries and can affect all age groups. It is possible to use preventive measures to significantly decrease the likelihood of falls. There are many simple measures which can make your home safer and prevent falls. OUTDOORS  Repair cracks and edges of walkways and driveways.  Remove high doorway thresholds.  Trim shrubbery on the main path into your home.  Have good outside lighting.  Clear walkways of tools, rocks, debris, and clutter.  Check that handrails are not broken and are securely fastened. Both sides of steps should have handrails.  Have leaves, snow, and ice cleared regularly.  Use sand or salt on walkways during winter months.  In the garage, clean up grease or oil spills. BATHROOM  Install night lights.  Install grab bars by the toilet and in the tub and shower.  Use non-skid mats or decals in the tub or shower.  Place a plastic non-slip stool in the shower to sit on, if needed.  Keep floors dry and clean up all water on the floor immediately.  Remove soap buildup in the tub or shower on a regular basis.  Secure bath mats with non-slip, double-sided rug tape.  Remove throw rugs and tripping hazards from the floors. BEDROOMS  Install night lights.  Make sure a bedside light is easy to reach.  Do not use oversized bedding.  Keep a telephone by your bedside.  Have a firm chair with side arms to use for getting dressed.  Remove throw rugs and tripping hazards from the floor. KITCHEN  Keep handles on pots and pans turned toward the center of the stove. Use back burners when possible.  Clean up spills quickly and allow time for drying.  Avoid walking on wet floors.  Avoid hot utensils and knives.  Position shelves so they are not too high or low.  Place commonly used objects within easy reach.  If necessary, use a sturdy step stool with a grab bar when reaching.  Keep electrical cables out of the  way.  Do not use floor polish or wax that makes floors slippery. If you must use wax, use non-skid floor wax.  Remove throw rugs and tripping hazards from the floor. STAIRWAYS  Never leave objects on stairs.  Place handrails on both sides of stairways and use them. Fix any loose handrails. Make sure handrails on both sides of the stairways are as long as the stairs.  Check carpeting to make sure it is firmly attached along stairs. Make repairs to worn or loose carpet promptly.  Avoid placing throw rugs at the top or bottom of stairways, or properly secure the rug with carpet tape to prevent slippage. Get rid of throw rugs, if possible.  Have an electrician put in a light switch at the top and bottom of the stairs. OTHER FALL PREVENTION TIPS  Wear low-heel or rubber-soled shoes that are supportive and fit well. Wear closed toe shoes.  When using a stepladder, make sure it is fully opened and both spreaders are firmly locked. Do not climb a closed stepladder.  Add color or contrast paint or tape to grab bars and handrails in your home. Place contrasting color strips on first and last steps.  Learn and use mobility aids as needed. Install an electrical emergency response system.  Turn on lights to avoid dark areas. Replace light bulbs that burn out immediately. Get light switches that glow.  Arrange furniture to create clear pathways. Keep furniture in the same place.  Firmly attach carpet with non-skid or double-sided tape.  Eliminate uneven floor surfaces.  Select a carpet pattern that does not visually hide the edge of steps.  Be aware of all pets. OTHER HOME SAFETY TIPS  Set the water temperature for 120 F (48.8 C).  Keep emergency numbers on or near the telephone.  Keep smoke detectors on every level of the home and near sleeping areas. Document Released: 01/24/2002 Document Revised: 08/05/2011 Document Reviewed: 04/25/2011 [facility] Patient Information 2015  [place], [place]. This information is not intended to replace advice given to you by your health care provider. Make sure you discuss any questions you have with your health care provider.  Tailbone Injury The tailbone (coccyx) is the small bone at the lower end of the spine. A tailbone injury may involve stretched ligaments, bruising, or a broken bone (fracture). Women are more vulnerable to this injury due to having a wider pelvis. CAUSES  This type of injury typically occurs from falling and landing on the tailbone. Repeated strain or friction from actions such as rowing and bicycling may also injure the area. The tailbone can be injured during childbirth. Infections or tumors may also press on the tailbone and cause pain. Sometimes, the cause of injury is unknown. SYMPTOMS   Bruising.  Pain when sitting.  Painful bowel movements.  In women, pain during intercourse. DIAGNOSIS  Your caregiver can diagnose a tailbone injury based on your symptoms and a physical exam. X-rays may be taken if a fracture is suspected. Your caregiver may also use an MRI scan imaging test to evaluate your symptoms. TREATMENT  Your caregiver may prescribe medicines to help relieve your pain. Most tailbone injuries heal on their own in 4 to 6 weeks. However, if the injury is caused by an infection or tumor, the recovery period may vary. PREVENTION  Wear appropriate padding and sports gear when bicycling and rowing. This can help prevent an injury from repeated strain or friction. HOME CARE INSTRUCTIONS   Put ice on the injured area.  Put ice in a plastic bag.  Place a towel between your skin and the bag.  Leave the ice on for 15-20 minutes, every hour while awake for the first 1 to 2 days.  Sit on a large, rubber or inflated ring or cushion to ease your pain. Lean forward when sitting to help decrease discomfort.  Avoid sitting for long periods of time.  Increase your activity as the pain allows.  Only  take over-the-counter or prescription medicines for pain, discomfort, or fever as directed by your caregiver.  You may use stool softeners if it is painful to have a bowel movement, or as directed by your caregiver.  Eat a diet with plenty of fiber to help prevent constipation.  Keep all follow-up appointments as directed by your caregiver. SEEK MEDICAL CARE IF:   Your pain becomes worse.  Your bowel movements cause a great deal of discomfort.  You are unable to have a bowel movement.  You have a fever. MAKE SURE YOU:  Understand these instructions.  Will watch your condition.  Will get help right away if you are not doing well or get worse. Document Released: 02/01/2000 Document Revised: 04/28/2011 Document Reviewed: 08/29/2010 [facility] Patient Information 2015 [place], [place]. This information is not intended to replace advice given to you by your health care provider. Make sure you discuss any questions you have with your health care provider.

## 2014-09-22 NOTE — ED Notes (Signed)
Pt returned from xray

## 2014-09-22 NOTE — ED Notes (Signed)
Pt comes into the ED via EMS c/o a fall WEdnesday where she fell straight on her tail bone.  Patient expresses pelvic pain and nausea when she attempts to sit up on her tail bone.  VS stable 144/82, 60 HR.  H/o macular degeneration, HTN, hyperlipidemia, osteoporosis, and dementia

## 2014-09-22 NOTE — ED Notes (Signed)
Facility representative verbalized understanding of discharge instructions given.

## 2014-09-23 DIAGNOSIS — G933 Postviral fatigue syndrome: Secondary | ICD-10-CM | POA: Diagnosis not present

## 2014-09-24 DIAGNOSIS — G933 Postviral fatigue syndrome: Secondary | ICD-10-CM | POA: Diagnosis not present

## 2014-09-25 DIAGNOSIS — G933 Postviral fatigue syndrome: Secondary | ICD-10-CM | POA: Diagnosis not present

## 2014-09-26 DIAGNOSIS — G933 Postviral fatigue syndrome: Secondary | ICD-10-CM | POA: Diagnosis not present

## 2014-09-27 DIAGNOSIS — G933 Postviral fatigue syndrome: Secondary | ICD-10-CM | POA: Diagnosis not present

## 2014-09-28 DIAGNOSIS — G933 Postviral fatigue syndrome: Secondary | ICD-10-CM | POA: Diagnosis not present

## 2014-09-29 DIAGNOSIS — G933 Postviral fatigue syndrome: Secondary | ICD-10-CM | POA: Diagnosis not present

## 2014-09-30 DIAGNOSIS — G933 Postviral fatigue syndrome: Secondary | ICD-10-CM | POA: Diagnosis not present

## 2014-10-01 DIAGNOSIS — G933 Postviral fatigue syndrome: Secondary | ICD-10-CM | POA: Diagnosis not present

## 2014-10-02 DIAGNOSIS — G933 Postviral fatigue syndrome: Secondary | ICD-10-CM | POA: Diagnosis not present

## 2014-10-03 DIAGNOSIS — G933 Postviral fatigue syndrome: Secondary | ICD-10-CM | POA: Diagnosis not present

## 2014-10-04 DIAGNOSIS — G933 Postviral fatigue syndrome: Secondary | ICD-10-CM | POA: Diagnosis not present

## 2014-10-05 DIAGNOSIS — G933 Postviral fatigue syndrome: Secondary | ICD-10-CM | POA: Diagnosis not present

## 2014-10-06 DIAGNOSIS — M810 Age-related osteoporosis without current pathological fracture: Secondary | ICD-10-CM | POA: Diagnosis not present

## 2014-10-06 DIAGNOSIS — M6281 Muscle weakness (generalized): Secondary | ICD-10-CM | POA: Diagnosis not present

## 2014-10-06 DIAGNOSIS — Z9181 History of falling: Secondary | ICD-10-CM | POA: Diagnosis not present

## 2014-10-06 DIAGNOSIS — Z87891 Personal history of nicotine dependence: Secondary | ICD-10-CM | POA: Diagnosis not present

## 2014-10-06 DIAGNOSIS — F0390 Unspecified dementia without behavioral disturbance: Secondary | ICD-10-CM | POA: Diagnosis not present

## 2014-10-06 DIAGNOSIS — I10 Essential (primary) hypertension: Secondary | ICD-10-CM | POA: Diagnosis not present

## 2014-10-06 DIAGNOSIS — F419 Anxiety disorder, unspecified: Secondary | ICD-10-CM | POA: Diagnosis not present

## 2014-10-06 DIAGNOSIS — G933 Postviral fatigue syndrome: Secondary | ICD-10-CM | POA: Diagnosis not present

## 2014-10-07 DIAGNOSIS — G933 Postviral fatigue syndrome: Secondary | ICD-10-CM | POA: Diagnosis not present

## 2014-10-08 DIAGNOSIS — G933 Postviral fatigue syndrome: Secondary | ICD-10-CM | POA: Diagnosis not present

## 2014-10-09 DIAGNOSIS — G933 Postviral fatigue syndrome: Secondary | ICD-10-CM | POA: Diagnosis not present

## 2014-10-10 DIAGNOSIS — G933 Postviral fatigue syndrome: Secondary | ICD-10-CM | POA: Diagnosis not present

## 2014-10-11 DIAGNOSIS — F419 Anxiety disorder, unspecified: Secondary | ICD-10-CM | POA: Diagnosis not present

## 2014-10-11 DIAGNOSIS — G933 Postviral fatigue syndrome: Secondary | ICD-10-CM | POA: Diagnosis not present

## 2014-10-11 DIAGNOSIS — Z87891 Personal history of nicotine dependence: Secondary | ICD-10-CM | POA: Diagnosis not present

## 2014-10-11 DIAGNOSIS — I10 Essential (primary) hypertension: Secondary | ICD-10-CM | POA: Diagnosis not present

## 2014-10-11 DIAGNOSIS — F0390 Unspecified dementia without behavioral disturbance: Secondary | ICD-10-CM | POA: Diagnosis not present

## 2014-10-11 DIAGNOSIS — Z9181 History of falling: Secondary | ICD-10-CM | POA: Diagnosis not present

## 2014-10-11 DIAGNOSIS — M6281 Muscle weakness (generalized): Secondary | ICD-10-CM | POA: Diagnosis not present

## 2014-10-11 DIAGNOSIS — M810 Age-related osteoporosis without current pathological fracture: Secondary | ICD-10-CM | POA: Diagnosis not present

## 2014-10-12 DIAGNOSIS — F0390 Unspecified dementia without behavioral disturbance: Secondary | ICD-10-CM | POA: Diagnosis not present

## 2014-10-12 DIAGNOSIS — M810 Age-related osteoporosis without current pathological fracture: Secondary | ICD-10-CM | POA: Diagnosis not present

## 2014-10-12 DIAGNOSIS — G933 Postviral fatigue syndrome: Secondary | ICD-10-CM | POA: Diagnosis not present

## 2014-10-12 DIAGNOSIS — I10 Essential (primary) hypertension: Secondary | ICD-10-CM | POA: Diagnosis not present

## 2014-10-12 DIAGNOSIS — F419 Anxiety disorder, unspecified: Secondary | ICD-10-CM | POA: Diagnosis not present

## 2014-10-12 DIAGNOSIS — M6281 Muscle weakness (generalized): Secondary | ICD-10-CM | POA: Diagnosis not present

## 2014-10-12 DIAGNOSIS — Z87891 Personal history of nicotine dependence: Secondary | ICD-10-CM | POA: Diagnosis not present

## 2014-10-12 DIAGNOSIS — Z9181 History of falling: Secondary | ICD-10-CM | POA: Diagnosis not present

## 2014-10-13 DIAGNOSIS — G933 Postviral fatigue syndrome: Secondary | ICD-10-CM | POA: Diagnosis not present

## 2014-10-14 DIAGNOSIS — G933 Postviral fatigue syndrome: Secondary | ICD-10-CM | POA: Diagnosis not present

## 2014-10-15 DIAGNOSIS — G933 Postviral fatigue syndrome: Secondary | ICD-10-CM | POA: Diagnosis not present

## 2014-10-16 DIAGNOSIS — G933 Postviral fatigue syndrome: Secondary | ICD-10-CM | POA: Diagnosis not present

## 2014-10-17 DIAGNOSIS — G933 Postviral fatigue syndrome: Secondary | ICD-10-CM | POA: Diagnosis not present

## 2014-10-18 DIAGNOSIS — Z87891 Personal history of nicotine dependence: Secondary | ICD-10-CM | POA: Diagnosis not present

## 2014-10-18 DIAGNOSIS — M810 Age-related osteoporosis without current pathological fracture: Secondary | ICD-10-CM | POA: Diagnosis not present

## 2014-10-18 DIAGNOSIS — F0390 Unspecified dementia without behavioral disturbance: Secondary | ICD-10-CM | POA: Diagnosis not present

## 2014-10-18 DIAGNOSIS — F419 Anxiety disorder, unspecified: Secondary | ICD-10-CM | POA: Diagnosis not present

## 2014-10-18 DIAGNOSIS — I10 Essential (primary) hypertension: Secondary | ICD-10-CM | POA: Diagnosis not present

## 2014-10-18 DIAGNOSIS — M6281 Muscle weakness (generalized): Secondary | ICD-10-CM | POA: Diagnosis not present

## 2014-10-18 DIAGNOSIS — G933 Postviral fatigue syndrome: Secondary | ICD-10-CM | POA: Diagnosis not present

## 2014-10-18 DIAGNOSIS — Z9181 History of falling: Secondary | ICD-10-CM | POA: Diagnosis not present

## 2014-10-19 DIAGNOSIS — G933 Postviral fatigue syndrome: Secondary | ICD-10-CM | POA: Diagnosis not present

## 2014-10-20 DIAGNOSIS — M810 Age-related osteoporosis without current pathological fracture: Secondary | ICD-10-CM | POA: Diagnosis not present

## 2014-10-20 DIAGNOSIS — Z87891 Personal history of nicotine dependence: Secondary | ICD-10-CM | POA: Diagnosis not present

## 2014-10-20 DIAGNOSIS — Z9181 History of falling: Secondary | ICD-10-CM | POA: Diagnosis not present

## 2014-10-20 DIAGNOSIS — G933 Postviral fatigue syndrome: Secondary | ICD-10-CM | POA: Diagnosis not present

## 2014-10-20 DIAGNOSIS — I10 Essential (primary) hypertension: Secondary | ICD-10-CM | POA: Diagnosis not present

## 2014-10-20 DIAGNOSIS — F0390 Unspecified dementia without behavioral disturbance: Secondary | ICD-10-CM | POA: Diagnosis not present

## 2014-10-20 DIAGNOSIS — F419 Anxiety disorder, unspecified: Secondary | ICD-10-CM | POA: Diagnosis not present

## 2014-10-20 DIAGNOSIS — M6281 Muscle weakness (generalized): Secondary | ICD-10-CM | POA: Diagnosis not present

## 2014-10-21 DIAGNOSIS — G933 Postviral fatigue syndrome: Secondary | ICD-10-CM | POA: Diagnosis not present

## 2014-10-22 DIAGNOSIS — G933 Postviral fatigue syndrome: Secondary | ICD-10-CM | POA: Diagnosis not present

## 2014-10-23 DIAGNOSIS — G933 Postviral fatigue syndrome: Secondary | ICD-10-CM | POA: Diagnosis not present

## 2014-10-24 DIAGNOSIS — Z87891 Personal history of nicotine dependence: Secondary | ICD-10-CM | POA: Diagnosis not present

## 2014-10-24 DIAGNOSIS — Z9181 History of falling: Secondary | ICD-10-CM | POA: Diagnosis not present

## 2014-10-24 DIAGNOSIS — M6281 Muscle weakness (generalized): Secondary | ICD-10-CM | POA: Diagnosis not present

## 2014-10-24 DIAGNOSIS — G933 Postviral fatigue syndrome: Secondary | ICD-10-CM | POA: Diagnosis not present

## 2014-10-24 DIAGNOSIS — F0390 Unspecified dementia without behavioral disturbance: Secondary | ICD-10-CM | POA: Diagnosis not present

## 2014-10-24 DIAGNOSIS — M810 Age-related osteoporosis without current pathological fracture: Secondary | ICD-10-CM | POA: Diagnosis not present

## 2014-10-24 DIAGNOSIS — I10 Essential (primary) hypertension: Secondary | ICD-10-CM | POA: Diagnosis not present

## 2014-10-24 DIAGNOSIS — F419 Anxiety disorder, unspecified: Secondary | ICD-10-CM | POA: Diagnosis not present

## 2014-10-25 DIAGNOSIS — G933 Postviral fatigue syndrome: Secondary | ICD-10-CM | POA: Diagnosis not present

## 2014-10-26 DIAGNOSIS — G933 Postviral fatigue syndrome: Secondary | ICD-10-CM | POA: Diagnosis not present

## 2014-10-27 DIAGNOSIS — G933 Postviral fatigue syndrome: Secondary | ICD-10-CM | POA: Diagnosis not present

## 2014-10-28 DIAGNOSIS — G933 Postviral fatigue syndrome: Secondary | ICD-10-CM | POA: Diagnosis not present

## 2014-10-29 DIAGNOSIS — G933 Postviral fatigue syndrome: Secondary | ICD-10-CM | POA: Diagnosis not present

## 2014-10-30 DIAGNOSIS — G933 Postviral fatigue syndrome: Secondary | ICD-10-CM | POA: Diagnosis not present

## 2014-10-31 DIAGNOSIS — G933 Postviral fatigue syndrome: Secondary | ICD-10-CM | POA: Diagnosis not present

## 2014-11-01 DIAGNOSIS — G933 Postviral fatigue syndrome: Secondary | ICD-10-CM | POA: Diagnosis not present

## 2014-11-02 DIAGNOSIS — F419 Anxiety disorder, unspecified: Secondary | ICD-10-CM | POA: Diagnosis not present

## 2014-11-02 DIAGNOSIS — M6281 Muscle weakness (generalized): Secondary | ICD-10-CM | POA: Diagnosis not present

## 2014-11-02 DIAGNOSIS — Z87891 Personal history of nicotine dependence: Secondary | ICD-10-CM | POA: Diagnosis not present

## 2014-11-02 DIAGNOSIS — I10 Essential (primary) hypertension: Secondary | ICD-10-CM | POA: Diagnosis not present

## 2014-11-02 DIAGNOSIS — M810 Age-related osteoporosis without current pathological fracture: Secondary | ICD-10-CM | POA: Diagnosis not present

## 2014-11-02 DIAGNOSIS — Z9181 History of falling: Secondary | ICD-10-CM | POA: Diagnosis not present

## 2014-11-02 DIAGNOSIS — F0390 Unspecified dementia without behavioral disturbance: Secondary | ICD-10-CM | POA: Diagnosis not present

## 2014-11-02 DIAGNOSIS — G933 Postviral fatigue syndrome: Secondary | ICD-10-CM | POA: Diagnosis not present

## 2014-11-03 DIAGNOSIS — G933 Postviral fatigue syndrome: Secondary | ICD-10-CM | POA: Diagnosis not present

## 2014-11-04 DIAGNOSIS — G933 Postviral fatigue syndrome: Secondary | ICD-10-CM | POA: Diagnosis not present

## 2014-11-05 DIAGNOSIS — G933 Postviral fatigue syndrome: Secondary | ICD-10-CM | POA: Diagnosis not present

## 2014-11-06 DIAGNOSIS — G933 Postviral fatigue syndrome: Secondary | ICD-10-CM | POA: Diagnosis not present

## 2014-11-07 DIAGNOSIS — G933 Postviral fatigue syndrome: Secondary | ICD-10-CM | POA: Diagnosis not present

## 2014-11-08 DIAGNOSIS — G933 Postviral fatigue syndrome: Secondary | ICD-10-CM | POA: Diagnosis not present

## 2014-11-09 DIAGNOSIS — G933 Postviral fatigue syndrome: Secondary | ICD-10-CM | POA: Diagnosis not present

## 2014-11-10 DIAGNOSIS — G933 Postviral fatigue syndrome: Secondary | ICD-10-CM | POA: Diagnosis not present

## 2014-11-11 DIAGNOSIS — G933 Postviral fatigue syndrome: Secondary | ICD-10-CM | POA: Diagnosis not present

## 2014-11-12 DIAGNOSIS — G933 Postviral fatigue syndrome: Secondary | ICD-10-CM | POA: Diagnosis not present

## 2014-11-13 DIAGNOSIS — G933 Postviral fatigue syndrome: Secondary | ICD-10-CM | POA: Diagnosis not present

## 2014-11-14 DIAGNOSIS — G933 Postviral fatigue syndrome: Secondary | ICD-10-CM | POA: Diagnosis not present

## 2014-11-15 DIAGNOSIS — G933 Postviral fatigue syndrome: Secondary | ICD-10-CM | POA: Diagnosis not present

## 2014-11-16 DIAGNOSIS — G933 Postviral fatigue syndrome: Secondary | ICD-10-CM | POA: Diagnosis not present

## 2014-11-17 DIAGNOSIS — G933 Postviral fatigue syndrome: Secondary | ICD-10-CM | POA: Diagnosis not present

## 2014-11-18 DIAGNOSIS — G933 Postviral fatigue syndrome: Secondary | ICD-10-CM | POA: Diagnosis not present

## 2014-11-19 DIAGNOSIS — G933 Postviral fatigue syndrome: Secondary | ICD-10-CM | POA: Diagnosis not present

## 2014-11-20 DIAGNOSIS — G933 Postviral fatigue syndrome: Secondary | ICD-10-CM | POA: Diagnosis not present

## 2014-11-21 DIAGNOSIS — G933 Postviral fatigue syndrome: Secondary | ICD-10-CM | POA: Diagnosis not present

## 2014-11-22 DIAGNOSIS — G933 Postviral fatigue syndrome: Secondary | ICD-10-CM | POA: Diagnosis not present

## 2014-11-23 DIAGNOSIS — G933 Postviral fatigue syndrome: Secondary | ICD-10-CM | POA: Diagnosis not present

## 2014-11-24 DIAGNOSIS — G933 Postviral fatigue syndrome: Secondary | ICD-10-CM | POA: Diagnosis not present

## 2014-11-25 DIAGNOSIS — G933 Postviral fatigue syndrome: Secondary | ICD-10-CM | POA: Diagnosis not present

## 2014-11-26 DIAGNOSIS — G933 Postviral fatigue syndrome: Secondary | ICD-10-CM | POA: Diagnosis not present

## 2014-11-27 DIAGNOSIS — G933 Postviral fatigue syndrome: Secondary | ICD-10-CM | POA: Diagnosis not present

## 2014-11-28 DIAGNOSIS — G933 Postviral fatigue syndrome: Secondary | ICD-10-CM | POA: Diagnosis not present

## 2014-11-29 DIAGNOSIS — G933 Postviral fatigue syndrome: Secondary | ICD-10-CM | POA: Diagnosis not present

## 2014-11-30 DIAGNOSIS — G933 Postviral fatigue syndrome: Secondary | ICD-10-CM | POA: Diagnosis not present

## 2014-12-01 DIAGNOSIS — G933 Postviral fatigue syndrome: Secondary | ICD-10-CM | POA: Diagnosis not present

## 2014-12-02 DIAGNOSIS — G933 Postviral fatigue syndrome: Secondary | ICD-10-CM | POA: Diagnosis not present

## 2014-12-03 DIAGNOSIS — G933 Postviral fatigue syndrome: Secondary | ICD-10-CM | POA: Diagnosis not present

## 2014-12-04 DIAGNOSIS — G933 Postviral fatigue syndrome: Secondary | ICD-10-CM | POA: Diagnosis not present

## 2014-12-05 DIAGNOSIS — G933 Postviral fatigue syndrome: Secondary | ICD-10-CM | POA: Diagnosis not present

## 2014-12-06 DIAGNOSIS — G933 Postviral fatigue syndrome: Secondary | ICD-10-CM | POA: Diagnosis not present

## 2014-12-07 DIAGNOSIS — G933 Postviral fatigue syndrome: Secondary | ICD-10-CM | POA: Diagnosis not present

## 2014-12-07 DIAGNOSIS — F0390 Unspecified dementia without behavioral disturbance: Secondary | ICD-10-CM | POA: Diagnosis not present

## 2014-12-07 DIAGNOSIS — R32 Unspecified urinary incontinence: Secondary | ICD-10-CM | POA: Diagnosis not present

## 2014-12-08 DIAGNOSIS — G933 Postviral fatigue syndrome: Secondary | ICD-10-CM | POA: Diagnosis not present

## 2014-12-09 DIAGNOSIS — G933 Postviral fatigue syndrome: Secondary | ICD-10-CM | POA: Diagnosis not present

## 2014-12-10 DIAGNOSIS — G933 Postviral fatigue syndrome: Secondary | ICD-10-CM | POA: Diagnosis not present

## 2014-12-11 DIAGNOSIS — G933 Postviral fatigue syndrome: Secondary | ICD-10-CM | POA: Diagnosis not present

## 2014-12-12 DIAGNOSIS — G933 Postviral fatigue syndrome: Secondary | ICD-10-CM | POA: Diagnosis not present

## 2014-12-13 DIAGNOSIS — G933 Postviral fatigue syndrome: Secondary | ICD-10-CM | POA: Diagnosis not present

## 2014-12-14 DIAGNOSIS — G933 Postviral fatigue syndrome: Secondary | ICD-10-CM | POA: Diagnosis not present

## 2014-12-15 DIAGNOSIS — G933 Postviral fatigue syndrome: Secondary | ICD-10-CM | POA: Diagnosis not present

## 2014-12-16 DIAGNOSIS — G933 Postviral fatigue syndrome: Secondary | ICD-10-CM | POA: Diagnosis not present

## 2014-12-17 DIAGNOSIS — G933 Postviral fatigue syndrome: Secondary | ICD-10-CM | POA: Diagnosis not present

## 2014-12-18 DIAGNOSIS — G933 Postviral fatigue syndrome: Secondary | ICD-10-CM | POA: Diagnosis not present

## 2014-12-19 DIAGNOSIS — G933 Postviral fatigue syndrome: Secondary | ICD-10-CM | POA: Diagnosis not present

## 2014-12-20 DIAGNOSIS — G933 Postviral fatigue syndrome: Secondary | ICD-10-CM | POA: Diagnosis not present

## 2014-12-21 DIAGNOSIS — G933 Postviral fatigue syndrome: Secondary | ICD-10-CM | POA: Diagnosis not present

## 2014-12-22 DIAGNOSIS — G933 Postviral fatigue syndrome: Secondary | ICD-10-CM | POA: Diagnosis not present

## 2014-12-23 DIAGNOSIS — G933 Postviral fatigue syndrome: Secondary | ICD-10-CM | POA: Diagnosis not present

## 2014-12-24 DIAGNOSIS — G933 Postviral fatigue syndrome: Secondary | ICD-10-CM | POA: Diagnosis not present

## 2014-12-25 DIAGNOSIS — G933 Postviral fatigue syndrome: Secondary | ICD-10-CM | POA: Diagnosis not present

## 2014-12-26 DIAGNOSIS — G933 Postviral fatigue syndrome: Secondary | ICD-10-CM | POA: Diagnosis not present

## 2014-12-27 DIAGNOSIS — G933 Postviral fatigue syndrome: Secondary | ICD-10-CM | POA: Diagnosis not present

## 2014-12-28 DIAGNOSIS — G933 Postviral fatigue syndrome: Secondary | ICD-10-CM | POA: Diagnosis not present

## 2014-12-29 DIAGNOSIS — G933 Postviral fatigue syndrome: Secondary | ICD-10-CM | POA: Diagnosis not present

## 2014-12-30 DIAGNOSIS — G933 Postviral fatigue syndrome: Secondary | ICD-10-CM | POA: Diagnosis not present

## 2014-12-31 DIAGNOSIS — G933 Postviral fatigue syndrome: Secondary | ICD-10-CM | POA: Diagnosis not present

## 2015-01-01 DIAGNOSIS — G933 Postviral fatigue syndrome: Secondary | ICD-10-CM | POA: Diagnosis not present

## 2015-01-02 DIAGNOSIS — G933 Postviral fatigue syndrome: Secondary | ICD-10-CM | POA: Diagnosis not present

## 2015-01-03 DIAGNOSIS — G933 Postviral fatigue syndrome: Secondary | ICD-10-CM | POA: Diagnosis not present

## 2015-01-04 DIAGNOSIS — G933 Postviral fatigue syndrome: Secondary | ICD-10-CM | POA: Diagnosis not present

## 2015-01-05 DIAGNOSIS — R32 Unspecified urinary incontinence: Secondary | ICD-10-CM | POA: Diagnosis not present

## 2015-01-05 DIAGNOSIS — G933 Postviral fatigue syndrome: Secondary | ICD-10-CM | POA: Diagnosis not present

## 2015-01-05 DIAGNOSIS — F0390 Unspecified dementia without behavioral disturbance: Secondary | ICD-10-CM | POA: Diagnosis not present

## 2015-01-06 DIAGNOSIS — G933 Postviral fatigue syndrome: Secondary | ICD-10-CM | POA: Diagnosis not present

## 2015-01-07 DIAGNOSIS — G933 Postviral fatigue syndrome: Secondary | ICD-10-CM | POA: Diagnosis not present

## 2015-01-08 DIAGNOSIS — G933 Postviral fatigue syndrome: Secondary | ICD-10-CM | POA: Diagnosis not present

## 2015-01-09 DIAGNOSIS — G933 Postviral fatigue syndrome: Secondary | ICD-10-CM | POA: Diagnosis not present

## 2015-01-10 DIAGNOSIS — G933 Postviral fatigue syndrome: Secondary | ICD-10-CM | POA: Diagnosis not present

## 2015-01-11 DIAGNOSIS — G933 Postviral fatigue syndrome: Secondary | ICD-10-CM | POA: Diagnosis not present

## 2015-01-12 DIAGNOSIS — G933 Postviral fatigue syndrome: Secondary | ICD-10-CM | POA: Diagnosis not present

## 2015-01-13 DIAGNOSIS — G933 Postviral fatigue syndrome: Secondary | ICD-10-CM | POA: Diagnosis not present

## 2015-01-14 DIAGNOSIS — G933 Postviral fatigue syndrome: Secondary | ICD-10-CM | POA: Diagnosis not present

## 2015-01-15 DIAGNOSIS — G933 Postviral fatigue syndrome: Secondary | ICD-10-CM | POA: Diagnosis not present

## 2015-01-16 DIAGNOSIS — E559 Vitamin D deficiency, unspecified: Secondary | ICD-10-CM | POA: Diagnosis not present

## 2015-01-16 DIAGNOSIS — G933 Postviral fatigue syndrome: Secondary | ICD-10-CM | POA: Diagnosis not present

## 2015-01-16 DIAGNOSIS — Z0000 Encounter for general adult medical examination without abnormal findings: Secondary | ICD-10-CM | POA: Diagnosis not present

## 2015-01-16 DIAGNOSIS — E039 Hypothyroidism, unspecified: Secondary | ICD-10-CM | POA: Diagnosis not present

## 2015-01-16 DIAGNOSIS — I10 Essential (primary) hypertension: Secondary | ICD-10-CM | POA: Diagnosis not present

## 2015-01-17 DIAGNOSIS — G933 Postviral fatigue syndrome: Secondary | ICD-10-CM | POA: Diagnosis not present

## 2015-01-18 DIAGNOSIS — G933 Postviral fatigue syndrome: Secondary | ICD-10-CM | POA: Diagnosis not present

## 2015-01-19 DIAGNOSIS — G933 Postviral fatigue syndrome: Secondary | ICD-10-CM | POA: Diagnosis not present

## 2015-01-20 DIAGNOSIS — G933 Postviral fatigue syndrome: Secondary | ICD-10-CM | POA: Diagnosis not present

## 2015-01-21 DIAGNOSIS — G933 Postviral fatigue syndrome: Secondary | ICD-10-CM | POA: Diagnosis not present

## 2015-01-22 DIAGNOSIS — G933 Postviral fatigue syndrome: Secondary | ICD-10-CM | POA: Diagnosis not present

## 2015-01-23 DIAGNOSIS — G933 Postviral fatigue syndrome: Secondary | ICD-10-CM | POA: Diagnosis not present

## 2015-01-24 DIAGNOSIS — G933 Postviral fatigue syndrome: Secondary | ICD-10-CM | POA: Diagnosis not present

## 2015-01-25 DIAGNOSIS — M79675 Pain in left toe(s): Secondary | ICD-10-CM | POA: Diagnosis not present

## 2015-01-25 DIAGNOSIS — M79674 Pain in right toe(s): Secondary | ICD-10-CM | POA: Diagnosis not present

## 2015-01-25 DIAGNOSIS — G933 Postviral fatigue syndrome: Secondary | ICD-10-CM | POA: Diagnosis not present

## 2015-01-25 DIAGNOSIS — B351 Tinea unguium: Secondary | ICD-10-CM | POA: Diagnosis not present

## 2015-01-26 DIAGNOSIS — G933 Postviral fatigue syndrome: Secondary | ICD-10-CM | POA: Diagnosis not present

## 2015-01-27 DIAGNOSIS — G933 Postviral fatigue syndrome: Secondary | ICD-10-CM | POA: Diagnosis not present

## 2015-01-28 DIAGNOSIS — G933 Postviral fatigue syndrome: Secondary | ICD-10-CM | POA: Diagnosis not present

## 2015-01-29 DIAGNOSIS — G933 Postviral fatigue syndrome: Secondary | ICD-10-CM | POA: Diagnosis not present

## 2015-01-30 DIAGNOSIS — G933 Postviral fatigue syndrome: Secondary | ICD-10-CM | POA: Diagnosis not present

## 2015-01-31 DIAGNOSIS — G933 Postviral fatigue syndrome: Secondary | ICD-10-CM | POA: Diagnosis not present

## 2015-02-01 DIAGNOSIS — G933 Postviral fatigue syndrome: Secondary | ICD-10-CM | POA: Diagnosis not present

## 2015-02-02 DIAGNOSIS — G933 Postviral fatigue syndrome: Secondary | ICD-10-CM | POA: Diagnosis not present

## 2015-02-03 DIAGNOSIS — G933 Postviral fatigue syndrome: Secondary | ICD-10-CM | POA: Diagnosis not present

## 2015-02-04 DIAGNOSIS — G933 Postviral fatigue syndrome: Secondary | ICD-10-CM | POA: Diagnosis not present

## 2015-02-05 DIAGNOSIS — R32 Unspecified urinary incontinence: Secondary | ICD-10-CM | POA: Diagnosis not present

## 2015-02-05 DIAGNOSIS — F0390 Unspecified dementia without behavioral disturbance: Secondary | ICD-10-CM | POA: Diagnosis not present

## 2015-02-05 DIAGNOSIS — G933 Postviral fatigue syndrome: Secondary | ICD-10-CM | POA: Diagnosis not present

## 2015-02-06 DIAGNOSIS — G933 Postviral fatigue syndrome: Secondary | ICD-10-CM | POA: Diagnosis not present

## 2015-02-07 DIAGNOSIS — G933 Postviral fatigue syndrome: Secondary | ICD-10-CM | POA: Diagnosis not present

## 2015-02-08 DIAGNOSIS — G933 Postviral fatigue syndrome: Secondary | ICD-10-CM | POA: Diagnosis not present

## 2015-02-09 DIAGNOSIS — G933 Postviral fatigue syndrome: Secondary | ICD-10-CM | POA: Diagnosis not present

## 2015-02-10 DIAGNOSIS — G933 Postviral fatigue syndrome: Secondary | ICD-10-CM | POA: Diagnosis not present

## 2015-02-11 DIAGNOSIS — G933 Postviral fatigue syndrome: Secondary | ICD-10-CM | POA: Diagnosis not present

## 2015-02-12 DIAGNOSIS — G933 Postviral fatigue syndrome: Secondary | ICD-10-CM | POA: Diagnosis not present

## 2015-02-13 DIAGNOSIS — G933 Postviral fatigue syndrome: Secondary | ICD-10-CM | POA: Diagnosis not present

## 2015-02-14 DIAGNOSIS — G933 Postviral fatigue syndrome: Secondary | ICD-10-CM | POA: Diagnosis not present

## 2015-02-15 DIAGNOSIS — G933 Postviral fatigue syndrome: Secondary | ICD-10-CM | POA: Diagnosis not present

## 2015-02-16 DIAGNOSIS — G933 Postviral fatigue syndrome: Secondary | ICD-10-CM | POA: Diagnosis not present

## 2015-02-17 DIAGNOSIS — G933 Postviral fatigue syndrome: Secondary | ICD-10-CM | POA: Diagnosis not present

## 2015-02-18 DIAGNOSIS — G933 Postviral fatigue syndrome: Secondary | ICD-10-CM | POA: Diagnosis not present

## 2015-02-19 ENCOUNTER — Emergency Department: Payer: Commercial Managed Care - HMO

## 2015-02-19 ENCOUNTER — Encounter: Payer: Self-pay | Admitting: Emergency Medicine

## 2015-02-19 ENCOUNTER — Emergency Department
Admission: EM | Admit: 2015-02-19 | Discharge: 2015-02-19 | Disposition: A | Discharge status: Home / Self Care | Payer: Commercial Managed Care - HMO | Attending: Emergency Medicine | Admitting: Emergency Medicine

## 2015-02-19 DIAGNOSIS — Z87891 Personal history of nicotine dependence: Secondary | ICD-10-CM | POA: Diagnosis not present

## 2015-02-19 DIAGNOSIS — Z043 Encounter for examination and observation following other accident: Secondary | ICD-10-CM | POA: Insufficient documentation

## 2015-02-19 DIAGNOSIS — Y92128 Other place in nursing home as the place of occurrence of the external cause: Secondary | ICD-10-CM | POA: Insufficient documentation

## 2015-02-19 DIAGNOSIS — Y998 Other external cause status: Secondary | ICD-10-CM | POA: Diagnosis not present

## 2015-02-19 DIAGNOSIS — Y9389 Activity, other specified: Secondary | ICD-10-CM | POA: Insufficient documentation

## 2015-02-19 DIAGNOSIS — W1839XA Other fall on same level, initial encounter: Secondary | ICD-10-CM | POA: Insufficient documentation

## 2015-02-19 DIAGNOSIS — R4182 Altered mental status, unspecified: Secondary | ICD-10-CM | POA: Diagnosis not present

## 2015-02-19 DIAGNOSIS — F0390 Unspecified dementia without behavioral disturbance: Secondary | ICD-10-CM | POA: Diagnosis not present

## 2015-02-19 DIAGNOSIS — Z79899 Other long term (current) drug therapy: Secondary | ICD-10-CM | POA: Diagnosis not present

## 2015-02-19 DIAGNOSIS — N390 Urinary tract infection, site not specified: Secondary | ICD-10-CM | POA: Insufficient documentation

## 2015-02-19 DIAGNOSIS — W19XXXA Unspecified fall, initial encounter: Secondary | ICD-10-CM

## 2015-02-19 DIAGNOSIS — I10 Essential (primary) hypertension: Secondary | ICD-10-CM | POA: Insufficient documentation

## 2015-02-19 DIAGNOSIS — S299XXA Unspecified injury of thorax, initial encounter: Secondary | ICD-10-CM | POA: Diagnosis not present

## 2015-02-19 DIAGNOSIS — G933 Postviral fatigue syndrome: Secondary | ICD-10-CM | POA: Diagnosis not present

## 2015-02-19 DIAGNOSIS — Y92129 Unspecified place in nursing home as the place of occurrence of the external cause: Secondary | ICD-10-CM

## 2015-02-19 LAB — COMPREHENSIVE METABOLIC PANEL
ALBUMIN: 4 g/dL (ref 3.5–5.0)
ALT: 16 U/L (ref 14–54)
AST: 25 U/L (ref 15–41)
Alkaline Phosphatase: 120 U/L (ref 38–126)
Anion gap: 8 (ref 5–15)
BILIRUBIN TOTAL: 0.4 mg/dL (ref 0.3–1.2)
BUN: 10 mg/dL (ref 6–20)
CHLORIDE: 106 mmol/L (ref 101–111)
CO2: 28 mmol/L (ref 22–32)
Calcium: 9 mg/dL (ref 8.9–10.3)
Creatinine, Ser: 0.64 mg/dL (ref 0.44–1.00)
GFR calc Af Amer: >60 mL/min (ref >60)
GFR calc non Af Amer: >60 mL/min (ref >60)
GLUCOSE: 96 mg/dL (ref 65–99)
POTASSIUM: 3.6 mmol/L (ref 3.5–5.1)
SODIUM: 142 mmol/L (ref 135–145)
Total Protein: 7.6 g/dL (ref 6.5–8.1)

## 2015-02-19 LAB — CBC
HCT: 37.6 % (ref 35.0–47.0)
Hemoglobin: 12.7 g/dL (ref 12.0–16.0)
MCH: 30.5 pg (ref 26.0–34.0)
MCHC: 33.8 g/dL (ref 32.0–36.0)
MCV: 90.3 fL (ref 80.0–100.0)
PLATELETS: 279 10*3/uL (ref 150–440)
RBC: 4.16 MIL/uL (ref 3.80–5.20)
RDW: 15.5 % — AB (ref 11.5–14.5)
WBC: 9.8 10*3/uL (ref 3.6–11.0)

## 2015-02-19 LAB — URINALYSIS COMPLETE WITH MICROSCOPIC (ARMC ONLY)
Bilirubin Urine: NEGATIVE
Glucose, UA: NEGATIVE mg/dL
HGB URINE DIPSTICK: NEGATIVE
Nitrite: POSITIVE — AB
PH: 7 (ref 5.0–8.0)
Protein, ur: NEGATIVE mg/dL
Specific Gravity, Urine: 1.01 (ref 1.005–1.030)

## 2015-02-19 LAB — TROPONIN I: Troponin I: <0.03 ng/mL (ref <0.031)

## 2015-02-19 MED ORDER — NITROFURANTOIN MONOHYD MACRO 100 MG PO CAPS: 100.0000 mg | ORAL_CAPSULE | Freq: Two times a day (BID) | ORAL | Status: AC

## 2015-02-19 NOTE — ED Notes (Signed)
Patient states that she was going to use the restroom, she reached to turn on the light and lost her balance and fell. Patient states that she falls almost every day. Patient denies LOC and any pain at this time. Patient very tearful.

## 2015-02-19 NOTE — ED Notes (Signed)
Called [facility] and spoke with Langley Gauss to see if they would be able to transport patient back to [facility] said they do have transportation who can come get patient.  Denise asked if UA was done on patient, I informed her that we had attempted several times to get a urine sample from patient but we were not able to. Langley Gauss stated that her supervisor was concerned that patient may have a UTI because she was more confused than normal and was up wondering around last night.

## 2015-02-19 NOTE — ED Notes (Signed)
Called [facility] and spoke with Langley Gauss, informed her that patient was diagnosed with a UTI and is being sent back with a RX for Macrobid and she will need to follow-up with her PCP for an ER follow-up. Langley Gauss verbalized understanding and will her supervisor over to pick patient up.  Patient is unable to sign for her discharge due to her hx/o Dementia.

## 2015-02-19 NOTE — Discharge Instructions (Signed)
Fall Prevention in Hospitals, Adult As a hospital patient, your condition and the treatments you receive can increase your risk for falls. Some additional risk factors for falls in a hospital include:  Being in an unfamiliar environment.  Being on bed rest.  Your surgery.  Taking certain medicines.  Your tubing requirements, such as intravenous (IV) therapy or catheters. It is important that you learn how to decrease fall risks while at the hospital. Below are important tips that can help prevent falls. SAFETY TIPS FOR PREVENTING FALLS Talk about your risk of falling.  Ask your health care provider why you are at risk for falling. Is it your medicine, illness, tubing placement, or something else?  Make a plan with your health care provider to keep you safe from falls.  Ask your health care provider or pharmacist about side effects of your medicines. Some medicines can make you dizzy or affect your coordination. Ask for help.  Ask for help before getting out of bed. You may need to press your call button.  Ask for assistance in getting safely to the toilet.  Ask for a walker or cane to be put at your bedside. Ask that most of the side rails on your bed be placed up before your health care provider leaves the room.  Ask family or friends to sit with you.  Ask for things that are out of your reach, such as your glasses, hearing aids, telephone, bedside table, or call button. Follow these tips to avoid falling:  Stay lying or seated, rather than standing, while waiting for help.  Wear rubber-soled slippers or shoes whenever you walk in the hospital.  Avoid quick, sudden movements.  Change positions slowly.  Sit on the side of your bed before standing.  Stand up slowly and wait before you start to walk.  Let your health care provider know if there is a spill on the floor.  Pay careful attention to the medical equipment, electrical cords, and tubes around you.  When you  need help, use your call button by your bed or in the bathroom. Wait for one of your health care providers to help you.  If you feel dizzy or unsure of your footing, return to bed and wait for assistance.  Avoid being distracted by the TV, telephone, or another person in your room.  Do not lean or support yourself on rolling objects, such as IV poles or bedside tables.   This information is not intended to replace advice given to you by your health care provider. Make sure you discuss any questions you have with your health care provider.   Document Released: 02/01/2000 Document Revised: 02/24/2014 Document Reviewed: 10/12/2011 Elsevier Interactive Patient Education [employer].

## 2015-02-19 NOTE — ED Notes (Signed)
Patient able to ambulate per her baseline. Dr. Corky Downs informed.   Attempted to get urine sample from patient but patient unable to give sample.

## 2015-02-19 NOTE — ED Notes (Signed)
EMS called to [facility] ([address]) for fall. Pt was on floor in bathroom when ems arrived. Pt denies injury. Pt was dizzy per ems before fall. Staff at facility report to ems pt was calling out a lot last night and seemed a little confused; not at her baseline.

## 2015-02-19 NOTE — ED Provider Notes (Signed)
[facility] Emergency Department Provider Note  ____________________________________________  Time seen: On arrival  I have reviewed the triage vital signs and the nursing notes.   HISTORY  Chief Complaint Fall  Patient is unable to provide accurate history due to dementia  HPI Sarah [name] is a 82 y.o. female who presents from [facility] after a fall. Patient with a history of dementiaor poorly found down by staff. Patient denies any injury or pain. She denies chest pain, she denies short of breath. Staff reports that she has been "more confused than normal"     Past Medical History  Diagnosis Date  . Dementia   . Macular degeneration   . Osteoporosis   . Fall   . Hypertension   . Hyperlipidemia     There are no active problems to display for this patient.   History reviewed. No pertinent past surgical history.  Current Outpatient Rx  Name  Route  Sig  Dispense  Refill  . acetaminophen (TYLENOL) 325 MG tablet   Oral   Take 650 mg by mouth every 4 (four) hours as needed.         Marland Kitchen acetaminophen (TYLENOL) 500 MG tablet   Oral   Take 500 mg by mouth 2 (two) times daily.         Marland Kitchen ALPRAZolam (XANAX) 0.25 MG tablet   Oral   Take 0.25 mg by mouth 3 (three) times daily as needed for anxiety.         Marland Kitchen amLODipine (NORVASC) 5 MG tablet   Oral   Take 5 mg by mouth daily.         Marland Kitchen aspirin 81 MG chewable tablet   Oral   Chew 81 mg by mouth daily.         . Cholecalciferol (VITAMIN D-1000 MAX ST) 1000 units tablet   Oral   Take 1 tablet by mouth daily.         Marland Kitchen donepezil (ARICEPT) 5 MG tablet   Oral   Take 5 mg by mouth at bedtime.         Marland Kitchen escitalopram (LEXAPRO) 10 MG tablet   Oral   Take 10 mg by mouth at bedtime.         . Multiple Vitamins-Minerals (PRESERVISION AREDS 2) CAPS   Oral   Take 1 capsule by mouth 2 (two) times daily.         . traMADol (ULTRAM) 50 MG tablet   Oral   Take 1 tablet (50 mg  total) by mouth every 6 (six) hours as needed.   20 tablet   0   . ergocalciferol (VITAMIN D2) 50000 UNITS capsule   Oral   Take 50,000 Units by mouth once a week.         Marland Kitchen oxyCODONE (OXY IR/ROXICODONE) 5 MG immediate release tablet   Oral   Take 5 mg by mouth every 6 (six) hours as needed for severe pain.           Allergies Review of patient's allergies indicates no known allergies.  History reviewed. No pertinent family history.  Social History Social History  Substance Use Topics  . Smoking status: Former Research scientist (life sciences)  . Smokeless tobacco: None  . Alcohol Use: No    Review of Systems Limited by dementia  Constitutional: Denies fever Eyes: Denies visual changes ENT: Denies sore throat Cardiovascular: Denies chest pain Respiratory: Denies cough Gastrointestinal: Negative for abdominal pain, vomiting and diarrhea. Genitourinary: Denies  dysuria Musculoskeletal: Negative for back pain. Negative for neck pain Skin: Negative for rash. Neurological: Negative for headaches or focal weakness     ____________________________________________   PHYSICAL EXAM:  VITAL SIGNS: ED Triage Vitals  Enc Vitals Group     BP 02/19/15 1056 188/62 mmHg     Pulse Rate 02/19/15 1056 76     Resp 02/19/15 1056 18     Temp 02/19/15 1056 97.9 F (36.6 C)     Temp Source 02/19/15 1056 Oral     SpO2 02/19/15 1056 100 %     Weight 02/19/15 1056 158 lb 12.8 oz (72.031 kg)     Height 02/19/15 1056 '5\' 1"'$  (1.549 m)     Head Cir --      Peak Flow --      Pain Score --      Pain Loc --      Pain Edu? --      Excl. in [place]? --      Constitutional: Alert. Well appearing and in no distress. Eyes: Conjunctivae are normal.  ENT   Head: Normocephalic and atraumatic.   Mouth/Throat: Mucous membranes are moist. Cardiovascular: Normal rate, regular rhythm. Normal and symmetric distal pulses are present in all extremities. No murmurs, rubs, or gallops. Respiratory: Normal respiratory  effort without tachypnea nor retractions. Breath sounds are clear and equal bilaterally.  Gastrointestinal: Soft and non-tender in all quadrants. No distention. There is no CVA tenderness. Genitourinary: deferred Musculoskeletal: Nontender with normal range of motion in all extremities. No lower extremity tenderness nor edema. Neurologic:  Normal speech and language. No gross focal neurologic deficits are appreciated. Skin:  Skin is warm, dry and intact. No rash noted. Psychiatric: Mood and affect are normal. Patient exhibits appropriate insight and judgment.  ____________________________________________    LABS (pertinent positives/negatives)  Labs Reviewed  CBC - Abnormal; Notable for the following:    RDW 15.5 (*)    All other components within normal limits  COMPREHENSIVE METABOLIC PANEL  TROPONIN I  URINALYSIS COMPLETEWITH MICROSCOPIC ([facility] ONLY)    ____________________________________________   EKG  ED ECG REPORT I, Lavonia Drafts, the attending physician, personally viewed and interpreted this ECG.  Date: 02/19/2015 EKG Time: 11:21 AM Rate: 70 Rhythm: normal sinus rhythm QRS Axis: normal Intervals: normal ST/T Wave abnormalities: normal Conduction Disutrbances: none Narrative Interpretation: unremarkable   ____________________________________________    RADIOLOGY I have personally reviewed any xrays that were ordered on this patient: No acute distress  ____________________________________________   PROCEDURES  Procedure(s) performed: none  Critical Care performed: none  ____________________________________________   INITIAL IMPRESSION / ASSESSMENT AND PLAN / ED COURSE  Pertinent labs & imaging results that were available during my care of the patient were reviewed by me and considered in my medical decision making (see chart for details).  Patient overall well-appearing benign exam. Vital signs are significant for chronic hypertension. We will  check labs and urinalysis and chest x-ray given description of possible increased confusion  Patient does have urinary tract infection but labs are otherwise unremarkable. We will treat with by mouth antibiotics and have the patient follow-up with her PCP. This was communicated to patient's facility  ____________________________________________   FINAL CLINICAL IMPRESSION(S) / ED DIAGNOSES  Final diagnoses:  Fall at nursing home, initial encounter   urinary tract infection   Lavonia Drafts, MD 02/19/15 [phone]

## 2015-02-20 DIAGNOSIS — G933 Postviral fatigue syndrome: Secondary | ICD-10-CM | POA: Diagnosis not present

## 2015-02-21 DIAGNOSIS — G933 Postviral fatigue syndrome: Secondary | ICD-10-CM | POA: Diagnosis not present

## 2015-02-22 DIAGNOSIS — G933 Postviral fatigue syndrome: Secondary | ICD-10-CM | POA: Diagnosis not present

## 2015-02-23 DIAGNOSIS — G933 Postviral fatigue syndrome: Secondary | ICD-10-CM | POA: Diagnosis not present

## 2015-02-24 DIAGNOSIS — G933 Postviral fatigue syndrome: Secondary | ICD-10-CM | POA: Diagnosis not present

## 2015-02-25 DIAGNOSIS — G933 Postviral fatigue syndrome: Secondary | ICD-10-CM | POA: Diagnosis not present

## 2015-02-26 DIAGNOSIS — G301 Alzheimer's disease with late onset: Secondary | ICD-10-CM | POA: Diagnosis not present

## 2015-02-26 DIAGNOSIS — G933 Postviral fatigue syndrome: Secondary | ICD-10-CM | POA: Diagnosis not present

## 2015-02-27 DIAGNOSIS — G933 Postviral fatigue syndrome: Secondary | ICD-10-CM | POA: Diagnosis not present

## 2015-02-28 DIAGNOSIS — G933 Postviral fatigue syndrome: Secondary | ICD-10-CM | POA: Diagnosis not present

## 2015-03-01 DIAGNOSIS — G933 Postviral fatigue syndrome: Secondary | ICD-10-CM | POA: Diagnosis not present

## 2015-03-02 DIAGNOSIS — M150 Primary generalized (osteo)arthritis: Secondary | ICD-10-CM | POA: Diagnosis not present

## 2015-03-02 DIAGNOSIS — E782 Mixed hyperlipidemia: Secondary | ICD-10-CM | POA: Diagnosis not present

## 2015-03-02 DIAGNOSIS — G301 Alzheimer's disease with late onset: Secondary | ICD-10-CM | POA: Diagnosis not present

## 2015-03-02 DIAGNOSIS — Z7982 Long term (current) use of aspirin: Secondary | ICD-10-CM | POA: Diagnosis not present

## 2015-03-02 DIAGNOSIS — R531 Weakness: Secondary | ICD-10-CM | POA: Diagnosis not present

## 2015-03-02 DIAGNOSIS — I10 Essential (primary) hypertension: Secondary | ICD-10-CM | POA: Diagnosis not present

## 2015-03-02 DIAGNOSIS — Z87440 Personal history of urinary (tract) infections: Secondary | ICD-10-CM | POA: Diagnosis not present

## 2015-03-02 DIAGNOSIS — G933 Postviral fatigue syndrome: Secondary | ICD-10-CM | POA: Diagnosis not present

## 2015-03-02 DIAGNOSIS — H3530 Unspecified macular degeneration: Secondary | ICD-10-CM | POA: Diagnosis not present

## 2015-03-02 DIAGNOSIS — E559 Vitamin D deficiency, unspecified: Secondary | ICD-10-CM | POA: Diagnosis not present

## 2015-03-03 DIAGNOSIS — G933 Postviral fatigue syndrome: Secondary | ICD-10-CM | POA: Diagnosis not present

## 2015-03-04 DIAGNOSIS — G933 Postviral fatigue syndrome: Secondary | ICD-10-CM | POA: Diagnosis not present

## 2015-03-05 DIAGNOSIS — G933 Postviral fatigue syndrome: Secondary | ICD-10-CM | POA: Diagnosis not present

## 2015-03-06 DIAGNOSIS — G933 Postviral fatigue syndrome: Secondary | ICD-10-CM | POA: Diagnosis not present

## 2015-03-06 DIAGNOSIS — H353290 Exudative age-related macular degeneration, unspecified eye, stage unspecified: Secondary | ICD-10-CM | POA: Diagnosis not present

## 2015-03-07 DIAGNOSIS — G933 Postviral fatigue syndrome: Secondary | ICD-10-CM | POA: Diagnosis not present

## 2015-03-08 DIAGNOSIS — G933 Postviral fatigue syndrome: Secondary | ICD-10-CM | POA: Diagnosis not present

## 2015-03-08 DIAGNOSIS — R32 Unspecified urinary incontinence: Secondary | ICD-10-CM | POA: Diagnosis not present

## 2015-03-08 DIAGNOSIS — F0390 Unspecified dementia without behavioral disturbance: Secondary | ICD-10-CM | POA: Diagnosis not present

## 2015-03-09 DIAGNOSIS — G933 Postviral fatigue syndrome: Secondary | ICD-10-CM | POA: Diagnosis not present

## 2015-03-10 DIAGNOSIS — G933 Postviral fatigue syndrome: Secondary | ICD-10-CM | POA: Diagnosis not present

## 2015-03-11 DIAGNOSIS — G933 Postviral fatigue syndrome: Secondary | ICD-10-CM | POA: Diagnosis not present

## 2015-03-12 DIAGNOSIS — G933 Postviral fatigue syndrome: Secondary | ICD-10-CM | POA: Diagnosis not present

## 2015-03-13 DIAGNOSIS — G933 Postviral fatigue syndrome: Secondary | ICD-10-CM | POA: Diagnosis not present

## 2015-03-14 DIAGNOSIS — G933 Postviral fatigue syndrome: Secondary | ICD-10-CM | POA: Diagnosis not present

## 2015-03-15 DIAGNOSIS — G933 Postviral fatigue syndrome: Secondary | ICD-10-CM | POA: Diagnosis not present

## 2015-03-16 DIAGNOSIS — G933 Postviral fatigue syndrome: Secondary | ICD-10-CM | POA: Diagnosis not present

## 2015-03-17 DIAGNOSIS — G933 Postviral fatigue syndrome: Secondary | ICD-10-CM | POA: Diagnosis not present

## 2015-03-18 DIAGNOSIS — G933 Postviral fatigue syndrome: Secondary | ICD-10-CM | POA: Diagnosis not present

## 2015-03-19 DIAGNOSIS — G933 Postviral fatigue syndrome: Secondary | ICD-10-CM | POA: Diagnosis not present

## 2015-03-20 DIAGNOSIS — G933 Postviral fatigue syndrome: Secondary | ICD-10-CM | POA: Diagnosis not present

## 2015-03-21 DIAGNOSIS — G933 Postviral fatigue syndrome: Secondary | ICD-10-CM | POA: Diagnosis not present

## 2015-03-22 DIAGNOSIS — G933 Postviral fatigue syndrome: Secondary | ICD-10-CM | POA: Diagnosis not present

## 2015-03-23 DIAGNOSIS — G933 Postviral fatigue syndrome: Secondary | ICD-10-CM | POA: Diagnosis not present

## 2015-03-24 DIAGNOSIS — G933 Postviral fatigue syndrome: Secondary | ICD-10-CM | POA: Diagnosis not present

## 2015-03-25 DIAGNOSIS — G933 Postviral fatigue syndrome: Secondary | ICD-10-CM | POA: Diagnosis not present

## 2015-03-26 DIAGNOSIS — G933 Postviral fatigue syndrome: Secondary | ICD-10-CM | POA: Diagnosis not present

## 2015-03-27 DIAGNOSIS — G933 Postviral fatigue syndrome: Secondary | ICD-10-CM | POA: Diagnosis not present

## 2015-03-28 DIAGNOSIS — F064 Anxiety disorder due to known physiological condition: Secondary | ICD-10-CM | POA: Diagnosis not present

## 2015-03-28 DIAGNOSIS — G301 Alzheimer's disease with late onset: Secondary | ICD-10-CM | POA: Diagnosis not present

## 2015-03-28 DIAGNOSIS — G933 Postviral fatigue syndrome: Secondary | ICD-10-CM | POA: Diagnosis not present

## 2015-03-29 DIAGNOSIS — G933 Postviral fatigue syndrome: Secondary | ICD-10-CM | POA: Diagnosis not present

## 2015-03-30 DIAGNOSIS — G933 Postviral fatigue syndrome: Secondary | ICD-10-CM | POA: Diagnosis not present

## 2015-03-31 DIAGNOSIS — G933 Postviral fatigue syndrome: Secondary | ICD-10-CM | POA: Diagnosis not present

## 2015-04-01 DIAGNOSIS — G933 Postviral fatigue syndrome: Secondary | ICD-10-CM | POA: Diagnosis not present

## 2015-04-02 DIAGNOSIS — G933 Postviral fatigue syndrome: Secondary | ICD-10-CM | POA: Diagnosis not present

## 2015-04-03 DIAGNOSIS — G933 Postviral fatigue syndrome: Secondary | ICD-10-CM | POA: Diagnosis not present

## 2015-04-04 DIAGNOSIS — G933 Postviral fatigue syndrome: Secondary | ICD-10-CM | POA: Diagnosis not present

## 2015-04-05 DIAGNOSIS — G933 Postviral fatigue syndrome: Secondary | ICD-10-CM | POA: Diagnosis not present

## 2015-04-06 DIAGNOSIS — H3530 Unspecified macular degeneration: Secondary | ICD-10-CM | POA: Diagnosis not present

## 2015-04-06 DIAGNOSIS — G301 Alzheimer's disease with late onset: Secondary | ICD-10-CM | POA: Diagnosis not present

## 2015-04-06 DIAGNOSIS — Z7982 Long term (current) use of aspirin: Secondary | ICD-10-CM | POA: Diagnosis not present

## 2015-04-06 DIAGNOSIS — Z87440 Personal history of urinary (tract) infections: Secondary | ICD-10-CM | POA: Diagnosis not present

## 2015-04-06 DIAGNOSIS — M150 Primary generalized (osteo)arthritis: Secondary | ICD-10-CM | POA: Diagnosis not present

## 2015-04-06 DIAGNOSIS — G933 Postviral fatigue syndrome: Secondary | ICD-10-CM | POA: Diagnosis not present

## 2015-04-06 DIAGNOSIS — R531 Weakness: Secondary | ICD-10-CM | POA: Diagnosis not present

## 2015-04-06 DIAGNOSIS — I10 Essential (primary) hypertension: Secondary | ICD-10-CM | POA: Diagnosis not present

## 2015-04-06 DIAGNOSIS — E559 Vitamin D deficiency, unspecified: Secondary | ICD-10-CM | POA: Diagnosis not present

## 2015-04-06 DIAGNOSIS — E782 Mixed hyperlipidemia: Secondary | ICD-10-CM | POA: Diagnosis not present

## 2015-04-07 DIAGNOSIS — G933 Postviral fatigue syndrome: Secondary | ICD-10-CM | POA: Diagnosis not present

## 2015-04-08 DIAGNOSIS — G933 Postviral fatigue syndrome: Secondary | ICD-10-CM | POA: Diagnosis not present

## 2015-04-09 DIAGNOSIS — G933 Postviral fatigue syndrome: Secondary | ICD-10-CM | POA: Diagnosis not present

## 2015-04-10 DIAGNOSIS — G933 Postviral fatigue syndrome: Secondary | ICD-10-CM | POA: Diagnosis not present

## 2015-04-11 DIAGNOSIS — G933 Postviral fatigue syndrome: Secondary | ICD-10-CM | POA: Diagnosis not present

## 2015-04-12 DIAGNOSIS — G933 Postviral fatigue syndrome: Secondary | ICD-10-CM | POA: Diagnosis not present

## 2015-04-13 DIAGNOSIS — G933 Postviral fatigue syndrome: Secondary | ICD-10-CM | POA: Diagnosis not present

## 2015-04-14 DIAGNOSIS — G933 Postviral fatigue syndrome: Secondary | ICD-10-CM | POA: Diagnosis not present

## 2015-04-15 DIAGNOSIS — G933 Postviral fatigue syndrome: Secondary | ICD-10-CM | POA: Diagnosis not present

## 2015-04-16 DIAGNOSIS — G301 Alzheimer's disease with late onset: Secondary | ICD-10-CM | POA: Diagnosis not present

## 2015-04-16 DIAGNOSIS — F064 Anxiety disorder due to known physiological condition: Secondary | ICD-10-CM | POA: Diagnosis not present

## 2015-04-16 DIAGNOSIS — G933 Postviral fatigue syndrome: Secondary | ICD-10-CM | POA: Diagnosis not present

## 2015-04-17 DIAGNOSIS — G933 Postviral fatigue syndrome: Secondary | ICD-10-CM | POA: Diagnosis not present

## 2015-04-18 DIAGNOSIS — G933 Postviral fatigue syndrome: Secondary | ICD-10-CM | POA: Diagnosis not present

## 2015-04-26 DIAGNOSIS — G933 Postviral fatigue syndrome: Secondary | ICD-10-CM | POA: Diagnosis not present

## 2015-04-27 DIAGNOSIS — G933 Postviral fatigue syndrome: Secondary | ICD-10-CM | POA: Diagnosis not present

## 2015-04-28 DIAGNOSIS — G933 Postviral fatigue syndrome: Secondary | ICD-10-CM | POA: Diagnosis not present

## 2015-04-29 DIAGNOSIS — G933 Postviral fatigue syndrome: Secondary | ICD-10-CM | POA: Diagnosis not present

## 2015-04-30 DIAGNOSIS — G933 Postviral fatigue syndrome: Secondary | ICD-10-CM | POA: Diagnosis not present

## 2015-05-01 DIAGNOSIS — G933 Postviral fatigue syndrome: Secondary | ICD-10-CM | POA: Diagnosis not present

## 2015-05-02 DIAGNOSIS — G933 Postviral fatigue syndrome: Secondary | ICD-10-CM | POA: Diagnosis not present

## 2015-05-03 DIAGNOSIS — G933 Postviral fatigue syndrome: Secondary | ICD-10-CM | POA: Diagnosis not present

## 2015-05-04 DIAGNOSIS — I10 Essential (primary) hypertension: Secondary | ICD-10-CM | POA: Diagnosis not present

## 2015-05-04 DIAGNOSIS — E782 Mixed hyperlipidemia: Secondary | ICD-10-CM | POA: Diagnosis not present

## 2015-05-04 DIAGNOSIS — G933 Postviral fatigue syndrome: Secondary | ICD-10-CM | POA: Diagnosis not present

## 2015-05-04 DIAGNOSIS — E039 Hypothyroidism, unspecified: Secondary | ICD-10-CM | POA: Diagnosis not present

## 2015-05-04 DIAGNOSIS — E119 Type 2 diabetes mellitus without complications: Secondary | ICD-10-CM | POA: Diagnosis not present

## 2015-05-05 DIAGNOSIS — G933 Postviral fatigue syndrome: Secondary | ICD-10-CM | POA: Diagnosis not present

## 2015-05-06 DIAGNOSIS — G933 Postviral fatigue syndrome: Secondary | ICD-10-CM | POA: Diagnosis not present

## 2015-05-07 DIAGNOSIS — G933 Postviral fatigue syndrome: Secondary | ICD-10-CM | POA: Diagnosis not present

## 2015-05-08 DIAGNOSIS — G933 Postviral fatigue syndrome: Secondary | ICD-10-CM | POA: Diagnosis not present

## 2015-05-09 DIAGNOSIS — G933 Postviral fatigue syndrome: Secondary | ICD-10-CM | POA: Diagnosis not present

## 2015-05-10 DIAGNOSIS — R32 Unspecified urinary incontinence: Secondary | ICD-10-CM | POA: Diagnosis not present

## 2015-05-10 DIAGNOSIS — F0390 Unspecified dementia without behavioral disturbance: Secondary | ICD-10-CM | POA: Diagnosis not present

## 2015-05-10 DIAGNOSIS — G933 Postviral fatigue syndrome: Secondary | ICD-10-CM | POA: Diagnosis not present

## 2015-05-11 DIAGNOSIS — G301 Alzheimer's disease with late onset: Secondary | ICD-10-CM | POA: Diagnosis not present

## 2015-05-11 DIAGNOSIS — R946 Abnormal results of thyroid function studies: Secondary | ICD-10-CM | POA: Diagnosis not present

## 2015-05-11 DIAGNOSIS — E782 Mixed hyperlipidemia: Secondary | ICD-10-CM | POA: Diagnosis not present

## 2015-05-11 DIAGNOSIS — R531 Weakness: Secondary | ICD-10-CM | POA: Diagnosis not present

## 2015-05-11 DIAGNOSIS — I10 Essential (primary) hypertension: Secondary | ICD-10-CM | POA: Diagnosis not present

## 2015-05-11 DIAGNOSIS — E559 Vitamin D deficiency, unspecified: Secondary | ICD-10-CM | POA: Diagnosis not present

## 2015-05-11 DIAGNOSIS — G933 Postviral fatigue syndrome: Secondary | ICD-10-CM | POA: Diagnosis not present

## 2015-05-11 DIAGNOSIS — M150 Primary generalized (osteo)arthritis: Secondary | ICD-10-CM | POA: Diagnosis not present

## 2015-05-11 DIAGNOSIS — Z7982 Long term (current) use of aspirin: Secondary | ICD-10-CM | POA: Diagnosis not present

## 2015-05-11 DIAGNOSIS — H3530 Unspecified macular degeneration: Secondary | ICD-10-CM | POA: Diagnosis not present

## 2015-05-12 DIAGNOSIS — G933 Postviral fatigue syndrome: Secondary | ICD-10-CM | POA: Diagnosis not present

## 2015-05-13 DIAGNOSIS — G933 Postviral fatigue syndrome: Secondary | ICD-10-CM | POA: Diagnosis not present

## 2015-05-14 DIAGNOSIS — G933 Postviral fatigue syndrome: Secondary | ICD-10-CM | POA: Diagnosis not present

## 2015-05-15 DIAGNOSIS — G933 Postviral fatigue syndrome: Secondary | ICD-10-CM | POA: Diagnosis not present

## 2015-05-16 DIAGNOSIS — G933 Postviral fatigue syndrome: Secondary | ICD-10-CM | POA: Diagnosis not present

## 2015-05-17 DIAGNOSIS — G933 Postviral fatigue syndrome: Secondary | ICD-10-CM | POA: Diagnosis not present

## 2015-05-18 DIAGNOSIS — G933 Postviral fatigue syndrome: Secondary | ICD-10-CM | POA: Diagnosis not present

## 2015-05-18 DIAGNOSIS — F064 Anxiety disorder due to known physiological condition: Secondary | ICD-10-CM | POA: Diagnosis not present

## 2015-05-18 DIAGNOSIS — G301 Alzheimer's disease with late onset: Secondary | ICD-10-CM | POA: Diagnosis not present

## 2015-05-19 DIAGNOSIS — G933 Postviral fatigue syndrome: Secondary | ICD-10-CM | POA: Diagnosis not present

## 2015-05-20 DIAGNOSIS — G933 Postviral fatigue syndrome: Secondary | ICD-10-CM | POA: Diagnosis not present

## 2015-05-21 DIAGNOSIS — G933 Postviral fatigue syndrome: Secondary | ICD-10-CM | POA: Diagnosis not present

## 2015-05-22 DIAGNOSIS — G933 Postviral fatigue syndrome: Secondary | ICD-10-CM | POA: Diagnosis not present

## 2015-05-23 DIAGNOSIS — G933 Postviral fatigue syndrome: Secondary | ICD-10-CM | POA: Diagnosis not present

## 2015-05-24 DIAGNOSIS — G933 Postviral fatigue syndrome: Secondary | ICD-10-CM | POA: Diagnosis not present

## 2015-05-25 DIAGNOSIS — G933 Postviral fatigue syndrome: Secondary | ICD-10-CM | POA: Diagnosis not present

## 2015-05-26 DIAGNOSIS — G933 Postviral fatigue syndrome: Secondary | ICD-10-CM | POA: Diagnosis not present

## 2015-05-27 DIAGNOSIS — G933 Postviral fatigue syndrome: Secondary | ICD-10-CM | POA: Diagnosis not present

## 2015-05-28 DIAGNOSIS — G933 Postviral fatigue syndrome: Secondary | ICD-10-CM | POA: Diagnosis not present

## 2015-05-29 DIAGNOSIS — G933 Postviral fatigue syndrome: Secondary | ICD-10-CM | POA: Diagnosis not present

## 2015-05-30 DIAGNOSIS — G933 Postviral fatigue syndrome: Secondary | ICD-10-CM | POA: Diagnosis not present

## 2015-05-31 DIAGNOSIS — G933 Postviral fatigue syndrome: Secondary | ICD-10-CM | POA: Diagnosis not present

## 2015-06-01 DIAGNOSIS — E559 Vitamin D deficiency, unspecified: Secondary | ICD-10-CM | POA: Diagnosis not present

## 2015-06-01 DIAGNOSIS — G301 Alzheimer's disease with late onset: Secondary | ICD-10-CM | POA: Diagnosis not present

## 2015-06-01 DIAGNOSIS — R946 Abnormal results of thyroid function studies: Secondary | ICD-10-CM | POA: Diagnosis not present

## 2015-06-01 DIAGNOSIS — R531 Weakness: Secondary | ICD-10-CM | POA: Diagnosis not present

## 2015-06-01 DIAGNOSIS — H3530 Unspecified macular degeneration: Secondary | ICD-10-CM | POA: Diagnosis not present

## 2015-06-01 DIAGNOSIS — G933 Postviral fatigue syndrome: Secondary | ICD-10-CM | POA: Diagnosis not present

## 2015-06-01 DIAGNOSIS — E039 Hypothyroidism, unspecified: Secondary | ICD-10-CM | POA: Diagnosis not present

## 2015-06-01 DIAGNOSIS — M150 Primary generalized (osteo)arthritis: Secondary | ICD-10-CM | POA: Diagnosis not present

## 2015-06-01 DIAGNOSIS — I10 Essential (primary) hypertension: Secondary | ICD-10-CM | POA: Diagnosis not present

## 2015-06-01 DIAGNOSIS — E782 Mixed hyperlipidemia: Secondary | ICD-10-CM | POA: Diagnosis not present

## 2015-06-02 DIAGNOSIS — G933 Postviral fatigue syndrome: Secondary | ICD-10-CM | POA: Diagnosis not present

## 2015-06-03 DIAGNOSIS — G933 Postviral fatigue syndrome: Secondary | ICD-10-CM | POA: Diagnosis not present

## 2015-06-04 DIAGNOSIS — G933 Postviral fatigue syndrome: Secondary | ICD-10-CM | POA: Diagnosis not present

## 2015-06-05 DIAGNOSIS — G933 Postviral fatigue syndrome: Secondary | ICD-10-CM | POA: Diagnosis not present

## 2015-06-06 DIAGNOSIS — G933 Postviral fatigue syndrome: Secondary | ICD-10-CM | POA: Diagnosis not present

## 2015-06-07 DIAGNOSIS — G933 Postviral fatigue syndrome: Secondary | ICD-10-CM | POA: Diagnosis not present

## 2015-06-08 DIAGNOSIS — G933 Postviral fatigue syndrome: Secondary | ICD-10-CM | POA: Diagnosis not present

## 2015-06-08 DIAGNOSIS — I10 Essential (primary) hypertension: Secondary | ICD-10-CM | POA: Diagnosis not present

## 2015-06-09 DIAGNOSIS — G933 Postviral fatigue syndrome: Secondary | ICD-10-CM | POA: Diagnosis not present

## 2015-06-10 DIAGNOSIS — G933 Postviral fatigue syndrome: Secondary | ICD-10-CM | POA: Diagnosis not present

## 2015-06-11 DIAGNOSIS — G933 Postviral fatigue syndrome: Secondary | ICD-10-CM | POA: Diagnosis not present

## 2015-06-12 DIAGNOSIS — F064 Anxiety disorder due to known physiological condition: Secondary | ICD-10-CM | POA: Diagnosis not present

## 2015-06-12 DIAGNOSIS — G301 Alzheimer's disease with late onset: Secondary | ICD-10-CM | POA: Diagnosis not present

## 2015-06-12 DIAGNOSIS — R32 Unspecified urinary incontinence: Secondary | ICD-10-CM | POA: Diagnosis not present

## 2015-06-12 DIAGNOSIS — F0390 Unspecified dementia without behavioral disturbance: Secondary | ICD-10-CM | POA: Diagnosis not present

## 2015-06-12 DIAGNOSIS — G933 Postviral fatigue syndrome: Secondary | ICD-10-CM | POA: Diagnosis not present

## 2015-06-13 DIAGNOSIS — G933 Postviral fatigue syndrome: Secondary | ICD-10-CM | POA: Diagnosis not present

## 2015-06-14 DIAGNOSIS — G933 Postviral fatigue syndrome: Secondary | ICD-10-CM | POA: Diagnosis not present

## 2015-06-15 DIAGNOSIS — G933 Postviral fatigue syndrome: Secondary | ICD-10-CM | POA: Diagnosis not present

## 2015-06-16 DIAGNOSIS — G933 Postviral fatigue syndrome: Secondary | ICD-10-CM | POA: Diagnosis not present

## 2015-06-16 DIAGNOSIS — F064 Anxiety disorder due to known physiological condition: Secondary | ICD-10-CM | POA: Diagnosis not present

## 2015-06-16 DIAGNOSIS — G301 Alzheimer's disease with late onset: Secondary | ICD-10-CM | POA: Diagnosis not present

## 2015-06-17 DIAGNOSIS — G933 Postviral fatigue syndrome: Secondary | ICD-10-CM | POA: Diagnosis not present

## 2015-06-18 DIAGNOSIS — G933 Postviral fatigue syndrome: Secondary | ICD-10-CM | POA: Diagnosis not present

## 2015-06-19 DIAGNOSIS — G933 Postviral fatigue syndrome: Secondary | ICD-10-CM | POA: Diagnosis not present

## 2015-06-20 DIAGNOSIS — I10 Essential (primary) hypertension: Secondary | ICD-10-CM | POA: Diagnosis not present

## 2015-06-20 DIAGNOSIS — G301 Alzheimer's disease with late onset: Secondary | ICD-10-CM | POA: Diagnosis not present

## 2015-06-20 DIAGNOSIS — R531 Weakness: Secondary | ICD-10-CM | POA: Diagnosis not present

## 2015-06-20 DIAGNOSIS — G933 Postviral fatigue syndrome: Secondary | ICD-10-CM | POA: Diagnosis not present

## 2015-06-20 DIAGNOSIS — L853 Xerosis cutis: Secondary | ICD-10-CM | POA: Diagnosis not present

## 2015-06-21 DIAGNOSIS — G933 Postviral fatigue syndrome: Secondary | ICD-10-CM | POA: Diagnosis not present

## 2015-06-22 DIAGNOSIS — E782 Mixed hyperlipidemia: Secondary | ICD-10-CM | POA: Diagnosis not present

## 2015-06-22 DIAGNOSIS — E039 Hypothyroidism, unspecified: Secondary | ICD-10-CM | POA: Diagnosis not present

## 2015-06-22 DIAGNOSIS — I10 Essential (primary) hypertension: Secondary | ICD-10-CM | POA: Diagnosis not present

## 2015-06-22 DIAGNOSIS — G933 Postviral fatigue syndrome: Secondary | ICD-10-CM | POA: Diagnosis not present

## 2015-06-23 DIAGNOSIS — G933 Postviral fatigue syndrome: Secondary | ICD-10-CM | POA: Diagnosis not present

## 2015-06-24 DIAGNOSIS — G933 Postviral fatigue syndrome: Secondary | ICD-10-CM | POA: Diagnosis not present

## 2015-06-25 DIAGNOSIS — G933 Postviral fatigue syndrome: Secondary | ICD-10-CM | POA: Diagnosis not present

## 2015-06-26 DIAGNOSIS — G933 Postviral fatigue syndrome: Secondary | ICD-10-CM | POA: Diagnosis not present

## 2015-06-27 DIAGNOSIS — G933 Postviral fatigue syndrome: Secondary | ICD-10-CM | POA: Diagnosis not present

## 2015-06-28 DIAGNOSIS — G933 Postviral fatigue syndrome: Secondary | ICD-10-CM | POA: Diagnosis not present

## 2015-06-29 DIAGNOSIS — E782 Mixed hyperlipidemia: Secondary | ICD-10-CM | POA: Diagnosis not present

## 2015-06-29 DIAGNOSIS — Z7982 Long term (current) use of aspirin: Secondary | ICD-10-CM | POA: Diagnosis not present

## 2015-06-29 DIAGNOSIS — R531 Weakness: Secondary | ICD-10-CM | POA: Diagnosis not present

## 2015-06-29 DIAGNOSIS — R946 Abnormal results of thyroid function studies: Secondary | ICD-10-CM | POA: Diagnosis not present

## 2015-06-29 DIAGNOSIS — R600 Localized edema: Secondary | ICD-10-CM | POA: Diagnosis not present

## 2015-06-29 DIAGNOSIS — M150 Primary generalized (osteo)arthritis: Secondary | ICD-10-CM | POA: Diagnosis not present

## 2015-06-29 DIAGNOSIS — H3530 Unspecified macular degeneration: Secondary | ICD-10-CM | POA: Diagnosis not present

## 2015-06-29 DIAGNOSIS — G933 Postviral fatigue syndrome: Secondary | ICD-10-CM | POA: Diagnosis not present

## 2015-06-29 DIAGNOSIS — E559 Vitamin D deficiency, unspecified: Secondary | ICD-10-CM | POA: Diagnosis not present

## 2015-06-29 DIAGNOSIS — I10 Essential (primary) hypertension: Secondary | ICD-10-CM | POA: Diagnosis not present

## 2015-06-30 DIAGNOSIS — G933 Postviral fatigue syndrome: Secondary | ICD-10-CM | POA: Diagnosis not present

## 2015-07-01 DIAGNOSIS — G933 Postviral fatigue syndrome: Secondary | ICD-10-CM | POA: Diagnosis not present

## 2015-07-02 DIAGNOSIS — G933 Postviral fatigue syndrome: Secondary | ICD-10-CM | POA: Diagnosis not present

## 2015-07-03 DIAGNOSIS — G933 Postviral fatigue syndrome: Secondary | ICD-10-CM | POA: Diagnosis not present

## 2015-07-04 DIAGNOSIS — G933 Postviral fatigue syndrome: Secondary | ICD-10-CM | POA: Diagnosis not present

## 2015-07-05 DIAGNOSIS — G933 Postviral fatigue syndrome: Secondary | ICD-10-CM | POA: Diagnosis not present

## 2015-07-06 DIAGNOSIS — G933 Postviral fatigue syndrome: Secondary | ICD-10-CM | POA: Diagnosis not present

## 2015-07-07 DIAGNOSIS — G933 Postviral fatigue syndrome: Secondary | ICD-10-CM | POA: Diagnosis not present

## 2015-07-08 DIAGNOSIS — G933 Postviral fatigue syndrome: Secondary | ICD-10-CM | POA: Diagnosis not present

## 2015-07-09 DIAGNOSIS — G933 Postviral fatigue syndrome: Secondary | ICD-10-CM | POA: Diagnosis not present

## 2015-07-10 DIAGNOSIS — F0390 Unspecified dementia without behavioral disturbance: Secondary | ICD-10-CM | POA: Diagnosis not present

## 2015-07-10 DIAGNOSIS — G933 Postviral fatigue syndrome: Secondary | ICD-10-CM | POA: Diagnosis not present

## 2015-07-10 DIAGNOSIS — R32 Unspecified urinary incontinence: Secondary | ICD-10-CM | POA: Diagnosis not present

## 2015-07-11 DIAGNOSIS — G933 Postviral fatigue syndrome: Secondary | ICD-10-CM | POA: Diagnosis not present

## 2015-07-12 DIAGNOSIS — G933 Postviral fatigue syndrome: Secondary | ICD-10-CM | POA: Diagnosis not present

## 2015-07-13 DIAGNOSIS — G933 Postviral fatigue syndrome: Secondary | ICD-10-CM | POA: Diagnosis not present

## 2015-07-14 DIAGNOSIS — G933 Postviral fatigue syndrome: Secondary | ICD-10-CM | POA: Diagnosis not present

## 2015-07-15 DIAGNOSIS — G933 Postviral fatigue syndrome: Secondary | ICD-10-CM | POA: Diagnosis not present

## 2015-07-15 DIAGNOSIS — F064 Anxiety disorder due to known physiological condition: Secondary | ICD-10-CM | POA: Diagnosis not present

## 2015-07-15 DIAGNOSIS — G301 Alzheimer's disease with late onset: Secondary | ICD-10-CM | POA: Diagnosis not present

## 2015-07-16 DIAGNOSIS — G933 Postviral fatigue syndrome: Secondary | ICD-10-CM | POA: Diagnosis not present

## 2015-07-17 DIAGNOSIS — G933 Postviral fatigue syndrome: Secondary | ICD-10-CM | POA: Diagnosis not present

## 2015-07-18 DIAGNOSIS — G933 Postviral fatigue syndrome: Secondary | ICD-10-CM | POA: Diagnosis not present

## 2015-07-19 DIAGNOSIS — G933 Postviral fatigue syndrome: Secondary | ICD-10-CM | POA: Diagnosis not present

## 2015-07-20 DIAGNOSIS — G933 Postviral fatigue syndrome: Secondary | ICD-10-CM | POA: Diagnosis not present

## 2015-07-21 DIAGNOSIS — G933 Postviral fatigue syndrome: Secondary | ICD-10-CM | POA: Diagnosis not present

## 2015-07-22 DIAGNOSIS — G933 Postviral fatigue syndrome: Secondary | ICD-10-CM | POA: Diagnosis not present

## 2015-07-23 DIAGNOSIS — G933 Postviral fatigue syndrome: Secondary | ICD-10-CM | POA: Diagnosis not present

## 2015-07-24 DIAGNOSIS — G933 Postviral fatigue syndrome: Secondary | ICD-10-CM | POA: Diagnosis not present

## 2015-07-25 DIAGNOSIS — G933 Postviral fatigue syndrome: Secondary | ICD-10-CM | POA: Diagnosis not present

## 2015-07-26 DIAGNOSIS — G933 Postviral fatigue syndrome: Secondary | ICD-10-CM | POA: Diagnosis not present

## 2015-07-27 DIAGNOSIS — I10 Essential (primary) hypertension: Secondary | ICD-10-CM | POA: Diagnosis not present

## 2015-07-27 DIAGNOSIS — E782 Mixed hyperlipidemia: Secondary | ICD-10-CM | POA: Diagnosis not present

## 2015-07-27 DIAGNOSIS — E559 Vitamin D deficiency, unspecified: Secondary | ICD-10-CM | POA: Diagnosis not present

## 2015-07-27 DIAGNOSIS — W19XXXA Unspecified fall, initial encounter: Secondary | ICD-10-CM | POA: Diagnosis not present

## 2015-07-27 DIAGNOSIS — M25562 Pain in left knee: Secondary | ICD-10-CM | POA: Diagnosis not present

## 2015-07-27 DIAGNOSIS — M150 Primary generalized (osteo)arthritis: Secondary | ICD-10-CM | POA: Diagnosis not present

## 2015-07-27 DIAGNOSIS — M25531 Pain in right wrist: Secondary | ICD-10-CM | POA: Diagnosis not present

## 2015-07-27 DIAGNOSIS — H3530 Unspecified macular degeneration: Secondary | ICD-10-CM | POA: Diagnosis not present

## 2015-07-27 DIAGNOSIS — R946 Abnormal results of thyroid function studies: Secondary | ICD-10-CM | POA: Diagnosis not present

## 2015-07-27 DIAGNOSIS — R600 Localized edema: Secondary | ICD-10-CM | POA: Diagnosis not present

## 2015-07-27 DIAGNOSIS — G933 Postviral fatigue syndrome: Secondary | ICD-10-CM | POA: Diagnosis not present

## 2015-07-27 DIAGNOSIS — R531 Weakness: Secondary | ICD-10-CM | POA: Diagnosis not present

## 2015-07-28 DIAGNOSIS — G933 Postviral fatigue syndrome: Secondary | ICD-10-CM | POA: Diagnosis not present

## 2015-07-29 DIAGNOSIS — G933 Postviral fatigue syndrome: Secondary | ICD-10-CM | POA: Diagnosis not present

## 2015-07-30 DIAGNOSIS — G933 Postviral fatigue syndrome: Secondary | ICD-10-CM | POA: Diagnosis not present

## 2015-07-30 DIAGNOSIS — G301 Alzheimer's disease with late onset: Secondary | ICD-10-CM | POA: Diagnosis not present

## 2015-07-30 DIAGNOSIS — F064 Anxiety disorder due to known physiological condition: Secondary | ICD-10-CM | POA: Diagnosis not present

## 2015-07-31 DIAGNOSIS — G933 Postviral fatigue syndrome: Secondary | ICD-10-CM | POA: Diagnosis not present

## 2015-08-01 DIAGNOSIS — G933 Postviral fatigue syndrome: Secondary | ICD-10-CM | POA: Diagnosis not present

## 2015-08-02 DIAGNOSIS — G933 Postviral fatigue syndrome: Secondary | ICD-10-CM | POA: Diagnosis not present

## 2015-08-03 DIAGNOSIS — G933 Postviral fatigue syndrome: Secondary | ICD-10-CM | POA: Diagnosis not present

## 2015-08-04 DIAGNOSIS — G933 Postviral fatigue syndrome: Secondary | ICD-10-CM | POA: Diagnosis not present

## 2015-08-05 DIAGNOSIS — G933 Postviral fatigue syndrome: Secondary | ICD-10-CM | POA: Diagnosis not present

## 2015-08-06 DIAGNOSIS — G933 Postviral fatigue syndrome: Secondary | ICD-10-CM | POA: Diagnosis not present

## 2015-08-07 DIAGNOSIS — F0390 Unspecified dementia without behavioral disturbance: Secondary | ICD-10-CM | POA: Diagnosis not present

## 2015-08-07 DIAGNOSIS — R32 Unspecified urinary incontinence: Secondary | ICD-10-CM | POA: Diagnosis not present

## 2015-08-07 DIAGNOSIS — G933 Postviral fatigue syndrome: Secondary | ICD-10-CM | POA: Diagnosis not present

## 2015-08-08 DIAGNOSIS — G933 Postviral fatigue syndrome: Secondary | ICD-10-CM | POA: Diagnosis not present

## 2015-08-09 DIAGNOSIS — G933 Postviral fatigue syndrome: Secondary | ICD-10-CM | POA: Diagnosis not present

## 2015-08-10 DIAGNOSIS — G933 Postviral fatigue syndrome: Secondary | ICD-10-CM | POA: Diagnosis not present

## 2015-08-10 DIAGNOSIS — R946 Abnormal results of thyroid function studies: Secondary | ICD-10-CM | POA: Diagnosis not present

## 2015-08-11 DIAGNOSIS — G933 Postviral fatigue syndrome: Secondary | ICD-10-CM | POA: Diagnosis not present

## 2015-08-12 DIAGNOSIS — G933 Postviral fatigue syndrome: Secondary | ICD-10-CM | POA: Diagnosis not present

## 2015-08-13 DIAGNOSIS — G933 Postviral fatigue syndrome: Secondary | ICD-10-CM | POA: Diagnosis not present

## 2015-08-14 DIAGNOSIS — F064 Anxiety disorder due to known physiological condition: Secondary | ICD-10-CM | POA: Diagnosis not present

## 2015-08-14 DIAGNOSIS — G933 Postviral fatigue syndrome: Secondary | ICD-10-CM | POA: Diagnosis not present

## 2015-08-14 DIAGNOSIS — G301 Alzheimer's disease with late onset: Secondary | ICD-10-CM | POA: Diagnosis not present

## 2015-08-15 DIAGNOSIS — G933 Postviral fatigue syndrome: Secondary | ICD-10-CM | POA: Diagnosis not present

## 2015-08-16 DIAGNOSIS — G933 Postviral fatigue syndrome: Secondary | ICD-10-CM | POA: Diagnosis not present

## 2015-08-17 DIAGNOSIS — G933 Postviral fatigue syndrome: Secondary | ICD-10-CM | POA: Diagnosis not present

## 2015-08-18 DIAGNOSIS — G933 Postviral fatigue syndrome: Secondary | ICD-10-CM | POA: Diagnosis not present

## 2015-08-18 DIAGNOSIS — F064 Anxiety disorder due to known physiological condition: Secondary | ICD-10-CM | POA: Diagnosis not present

## 2015-08-18 DIAGNOSIS — G301 Alzheimer's disease with late onset: Secondary | ICD-10-CM | POA: Diagnosis not present

## 2015-08-19 DIAGNOSIS — G933 Postviral fatigue syndrome: Secondary | ICD-10-CM | POA: Diagnosis not present

## 2015-08-20 DIAGNOSIS — G933 Postviral fatigue syndrome: Secondary | ICD-10-CM | POA: Diagnosis not present

## 2015-08-21 DIAGNOSIS — G933 Postviral fatigue syndrome: Secondary | ICD-10-CM | POA: Diagnosis not present

## 2015-08-22 DIAGNOSIS — G933 Postviral fatigue syndrome: Secondary | ICD-10-CM | POA: Diagnosis not present

## 2015-08-23 DIAGNOSIS — G933 Postviral fatigue syndrome: Secondary | ICD-10-CM | POA: Diagnosis not present

## 2015-08-24 DIAGNOSIS — G933 Postviral fatigue syndrome: Secondary | ICD-10-CM | POA: Diagnosis not present

## 2015-08-24 DIAGNOSIS — R197 Diarrhea, unspecified: Secondary | ICD-10-CM | POA: Diagnosis not present

## 2015-08-24 DIAGNOSIS — R531 Weakness: Secondary | ICD-10-CM | POA: Diagnosis not present

## 2015-08-24 DIAGNOSIS — E782 Mixed hyperlipidemia: Secondary | ICD-10-CM | POA: Diagnosis not present

## 2015-08-24 DIAGNOSIS — H3530 Unspecified macular degeneration: Secondary | ICD-10-CM | POA: Diagnosis not present

## 2015-08-24 DIAGNOSIS — E559 Vitamin D deficiency, unspecified: Secondary | ICD-10-CM | POA: Diagnosis not present

## 2015-08-24 DIAGNOSIS — R946 Abnormal results of thyroid function studies: Secondary | ICD-10-CM | POA: Diagnosis not present

## 2015-08-24 DIAGNOSIS — I10 Essential (primary) hypertension: Secondary | ICD-10-CM | POA: Diagnosis not present

## 2015-08-24 DIAGNOSIS — E0580 Other thyrotoxicosis without thyrotoxic crisis or storm: Secondary | ICD-10-CM | POA: Diagnosis not present

## 2015-08-24 DIAGNOSIS — M150 Primary generalized (osteo)arthritis: Secondary | ICD-10-CM | POA: Diagnosis not present

## 2015-08-25 DIAGNOSIS — G933 Postviral fatigue syndrome: Secondary | ICD-10-CM | POA: Diagnosis not present

## 2015-08-26 DIAGNOSIS — G933 Postviral fatigue syndrome: Secondary | ICD-10-CM | POA: Diagnosis not present

## 2015-08-27 DIAGNOSIS — F064 Anxiety disorder due to known physiological condition: Secondary | ICD-10-CM | POA: Diagnosis not present

## 2015-08-27 DIAGNOSIS — G933 Postviral fatigue syndrome: Secondary | ICD-10-CM | POA: Diagnosis not present

## 2015-08-27 DIAGNOSIS — G301 Alzheimer's disease with late onset: Secondary | ICD-10-CM | POA: Diagnosis not present

## 2015-08-28 DIAGNOSIS — G933 Postviral fatigue syndrome: Secondary | ICD-10-CM | POA: Diagnosis not present

## 2015-08-29 DIAGNOSIS — G933 Postviral fatigue syndrome: Secondary | ICD-10-CM | POA: Diagnosis not present

## 2015-08-30 DIAGNOSIS — G933 Postviral fatigue syndrome: Secondary | ICD-10-CM | POA: Diagnosis not present

## 2015-08-31 DIAGNOSIS — R946 Abnormal results of thyroid function studies: Secondary | ICD-10-CM | POA: Diagnosis not present

## 2015-08-31 DIAGNOSIS — G933 Postviral fatigue syndrome: Secondary | ICD-10-CM | POA: Diagnosis not present

## 2015-09-01 DIAGNOSIS — G933 Postviral fatigue syndrome: Secondary | ICD-10-CM | POA: Diagnosis not present

## 2015-09-02 DIAGNOSIS — G933 Postviral fatigue syndrome: Secondary | ICD-10-CM | POA: Diagnosis not present

## 2015-09-03 DIAGNOSIS — G933 Postviral fatigue syndrome: Secondary | ICD-10-CM | POA: Diagnosis not present

## 2015-09-04 DIAGNOSIS — G933 Postviral fatigue syndrome: Secondary | ICD-10-CM | POA: Diagnosis not present

## 2015-09-05 DIAGNOSIS — G933 Postviral fatigue syndrome: Secondary | ICD-10-CM | POA: Diagnosis not present

## 2015-09-06 DIAGNOSIS — G933 Postviral fatigue syndrome: Secondary | ICD-10-CM | POA: Diagnosis not present

## 2015-09-07 DIAGNOSIS — G933 Postviral fatigue syndrome: Secondary | ICD-10-CM | POA: Diagnosis not present

## 2015-09-08 DIAGNOSIS — G933 Postviral fatigue syndrome: Secondary | ICD-10-CM | POA: Diagnosis not present

## 2015-09-09 DIAGNOSIS — G933 Postviral fatigue syndrome: Secondary | ICD-10-CM | POA: Diagnosis not present

## 2015-09-10 DIAGNOSIS — G933 Postviral fatigue syndrome: Secondary | ICD-10-CM | POA: Diagnosis not present

## 2015-09-10 DIAGNOSIS — G301 Alzheimer's disease with late onset: Secondary | ICD-10-CM | POA: Diagnosis not present

## 2015-09-10 DIAGNOSIS — M2010 Hallux valgus (acquired), unspecified foot: Secondary | ICD-10-CM | POA: Diagnosis not present

## 2015-09-10 DIAGNOSIS — L853 Xerosis cutis: Secondary | ICD-10-CM | POA: Diagnosis not present

## 2015-09-11 DIAGNOSIS — F0390 Unspecified dementia without behavioral disturbance: Secondary | ICD-10-CM | POA: Diagnosis not present

## 2015-09-11 DIAGNOSIS — G933 Postviral fatigue syndrome: Secondary | ICD-10-CM | POA: Diagnosis not present

## 2015-09-11 DIAGNOSIS — R32 Unspecified urinary incontinence: Secondary | ICD-10-CM | POA: Diagnosis not present

## 2015-09-12 DIAGNOSIS — G933 Postviral fatigue syndrome: Secondary | ICD-10-CM | POA: Diagnosis not present

## 2015-09-13 DIAGNOSIS — G933 Postviral fatigue syndrome: Secondary | ICD-10-CM | POA: Diagnosis not present

## 2015-09-14 DIAGNOSIS — Z9181 History of falling: Secondary | ICD-10-CM | POA: Diagnosis not present

## 2015-09-14 DIAGNOSIS — G933 Postviral fatigue syndrome: Secondary | ICD-10-CM | POA: Diagnosis not present

## 2015-09-14 DIAGNOSIS — F0280 Dementia in other diseases classified elsewhere without behavioral disturbance: Secondary | ICD-10-CM | POA: Diagnosis not present

## 2015-09-14 DIAGNOSIS — F064 Anxiety disorder due to known physiological condition: Secondary | ICD-10-CM | POA: Diagnosis not present

## 2015-09-14 DIAGNOSIS — E785 Hyperlipidemia, unspecified: Secondary | ICD-10-CM | POA: Diagnosis not present

## 2015-09-14 DIAGNOSIS — G301 Alzheimer's disease with late onset: Secondary | ICD-10-CM | POA: Diagnosis not present

## 2015-09-14 DIAGNOSIS — M810 Age-related osteoporosis without current pathological fracture: Secondary | ICD-10-CM | POA: Diagnosis not present

## 2015-09-14 DIAGNOSIS — I10 Essential (primary) hypertension: Secondary | ICD-10-CM | POA: Diagnosis not present

## 2015-09-15 DIAGNOSIS — G933 Postviral fatigue syndrome: Secondary | ICD-10-CM | POA: Diagnosis not present

## 2015-09-16 DIAGNOSIS — G933 Postviral fatigue syndrome: Secondary | ICD-10-CM | POA: Diagnosis not present

## 2015-09-17 DIAGNOSIS — G933 Postviral fatigue syndrome: Secondary | ICD-10-CM | POA: Diagnosis not present

## 2015-09-18 DIAGNOSIS — M810 Age-related osteoporosis without current pathological fracture: Secondary | ICD-10-CM | POA: Diagnosis not present

## 2015-09-18 DIAGNOSIS — F064 Anxiety disorder due to known physiological condition: Secondary | ICD-10-CM | POA: Diagnosis not present

## 2015-09-18 DIAGNOSIS — Z9181 History of falling: Secondary | ICD-10-CM | POA: Diagnosis not present

## 2015-09-18 DIAGNOSIS — E785 Hyperlipidemia, unspecified: Secondary | ICD-10-CM | POA: Diagnosis not present

## 2015-09-18 DIAGNOSIS — G933 Postviral fatigue syndrome: Secondary | ICD-10-CM | POA: Diagnosis not present

## 2015-09-18 DIAGNOSIS — I10 Essential (primary) hypertension: Secondary | ICD-10-CM | POA: Diagnosis not present

## 2015-09-18 DIAGNOSIS — F0280 Dementia in other diseases classified elsewhere without behavioral disturbance: Secondary | ICD-10-CM | POA: Diagnosis not present

## 2015-09-18 DIAGNOSIS — G301 Alzheimer's disease with late onset: Secondary | ICD-10-CM | POA: Diagnosis not present

## 2015-09-19 DIAGNOSIS — G933 Postviral fatigue syndrome: Secondary | ICD-10-CM | POA: Diagnosis not present

## 2015-09-20 DIAGNOSIS — F064 Anxiety disorder due to known physiological condition: Secondary | ICD-10-CM | POA: Diagnosis not present

## 2015-09-20 DIAGNOSIS — E785 Hyperlipidemia, unspecified: Secondary | ICD-10-CM | POA: Diagnosis not present

## 2015-09-20 DIAGNOSIS — Z9181 History of falling: Secondary | ICD-10-CM | POA: Diagnosis not present

## 2015-09-20 DIAGNOSIS — G933 Postviral fatigue syndrome: Secondary | ICD-10-CM | POA: Diagnosis not present

## 2015-09-20 DIAGNOSIS — M810 Age-related osteoporosis without current pathological fracture: Secondary | ICD-10-CM | POA: Diagnosis not present

## 2015-09-20 DIAGNOSIS — G301 Alzheimer's disease with late onset: Secondary | ICD-10-CM | POA: Diagnosis not present

## 2015-09-20 DIAGNOSIS — I10 Essential (primary) hypertension: Secondary | ICD-10-CM | POA: Diagnosis not present

## 2015-09-20 DIAGNOSIS — F0280 Dementia in other diseases classified elsewhere without behavioral disturbance: Secondary | ICD-10-CM | POA: Diagnosis not present

## 2015-09-21 DIAGNOSIS — Z0001 Encounter for general adult medical examination with abnormal findings: Secondary | ICD-10-CM | POA: Diagnosis not present

## 2015-09-21 DIAGNOSIS — E0580 Other thyrotoxicosis without thyrotoxic crisis or storm: Secondary | ICD-10-CM | POA: Diagnosis not present

## 2015-09-21 DIAGNOSIS — I10 Essential (primary) hypertension: Secondary | ICD-10-CM | POA: Diagnosis not present

## 2015-09-21 DIAGNOSIS — H3530 Unspecified macular degeneration: Secondary | ICD-10-CM | POA: Diagnosis not present

## 2015-09-21 DIAGNOSIS — G933 Postviral fatigue syndrome: Secondary | ICD-10-CM | POA: Diagnosis not present

## 2015-09-21 DIAGNOSIS — R531 Weakness: Secondary | ICD-10-CM | POA: Diagnosis not present

## 2015-09-21 DIAGNOSIS — E782 Mixed hyperlipidemia: Secondary | ICD-10-CM | POA: Diagnosis not present

## 2015-09-21 DIAGNOSIS — M150 Primary generalized (osteo)arthritis: Secondary | ICD-10-CM | POA: Diagnosis not present

## 2015-09-21 DIAGNOSIS — R946 Abnormal results of thyroid function studies: Secondary | ICD-10-CM | POA: Diagnosis not present

## 2015-09-21 DIAGNOSIS — E559 Vitamin D deficiency, unspecified: Secondary | ICD-10-CM | POA: Diagnosis not present

## 2015-09-21 DIAGNOSIS — R197 Diarrhea, unspecified: Secondary | ICD-10-CM | POA: Diagnosis not present

## 2015-09-22 DIAGNOSIS — G933 Postviral fatigue syndrome: Secondary | ICD-10-CM | POA: Diagnosis not present

## 2015-09-23 DIAGNOSIS — G933 Postviral fatigue syndrome: Secondary | ICD-10-CM | POA: Diagnosis not present

## 2015-09-24 DIAGNOSIS — G933 Postviral fatigue syndrome: Secondary | ICD-10-CM | POA: Diagnosis not present

## 2015-09-25 DIAGNOSIS — M810 Age-related osteoporosis without current pathological fracture: Secondary | ICD-10-CM | POA: Diagnosis not present

## 2015-09-25 DIAGNOSIS — E785 Hyperlipidemia, unspecified: Secondary | ICD-10-CM | POA: Diagnosis not present

## 2015-09-25 DIAGNOSIS — G933 Postviral fatigue syndrome: Secondary | ICD-10-CM | POA: Diagnosis not present

## 2015-09-25 DIAGNOSIS — Z9181 History of falling: Secondary | ICD-10-CM | POA: Diagnosis not present

## 2015-09-25 DIAGNOSIS — F064 Anxiety disorder due to known physiological condition: Secondary | ICD-10-CM | POA: Diagnosis not present

## 2015-09-25 DIAGNOSIS — F0280 Dementia in other diseases classified elsewhere without behavioral disturbance: Secondary | ICD-10-CM | POA: Diagnosis not present

## 2015-09-25 DIAGNOSIS — G301 Alzheimer's disease with late onset: Secondary | ICD-10-CM | POA: Diagnosis not present

## 2015-09-25 DIAGNOSIS — I10 Essential (primary) hypertension: Secondary | ICD-10-CM | POA: Diagnosis not present

## 2015-09-26 DIAGNOSIS — G933 Postviral fatigue syndrome: Secondary | ICD-10-CM | POA: Diagnosis not present

## 2015-09-27 DIAGNOSIS — M810 Age-related osteoporosis without current pathological fracture: Secondary | ICD-10-CM | POA: Diagnosis not present

## 2015-09-27 DIAGNOSIS — F064 Anxiety disorder due to known physiological condition: Secondary | ICD-10-CM | POA: Diagnosis not present

## 2015-09-27 DIAGNOSIS — Z9181 History of falling: Secondary | ICD-10-CM | POA: Diagnosis not present

## 2015-09-27 DIAGNOSIS — G301 Alzheimer's disease with late onset: Secondary | ICD-10-CM | POA: Diagnosis not present

## 2015-09-27 DIAGNOSIS — F0280 Dementia in other diseases classified elsewhere without behavioral disturbance: Secondary | ICD-10-CM | POA: Diagnosis not present

## 2015-09-27 DIAGNOSIS — I10 Essential (primary) hypertension: Secondary | ICD-10-CM | POA: Diagnosis not present

## 2015-09-27 DIAGNOSIS — G933 Postviral fatigue syndrome: Secondary | ICD-10-CM | POA: Diagnosis not present

## 2015-09-27 DIAGNOSIS — E785 Hyperlipidemia, unspecified: Secondary | ICD-10-CM | POA: Diagnosis not present

## 2015-09-28 DIAGNOSIS — I10 Essential (primary) hypertension: Secondary | ICD-10-CM | POA: Diagnosis not present

## 2015-09-28 DIAGNOSIS — E782 Mixed hyperlipidemia: Secondary | ICD-10-CM | POA: Diagnosis not present

## 2015-09-28 DIAGNOSIS — E0580 Other thyrotoxicosis without thyrotoxic crisis or storm: Secondary | ICD-10-CM | POA: Diagnosis not present

## 2015-09-28 DIAGNOSIS — G933 Postviral fatigue syndrome: Secondary | ICD-10-CM | POA: Diagnosis not present

## 2015-09-28 DIAGNOSIS — Z7982 Long term (current) use of aspirin: Secondary | ICD-10-CM | POA: Diagnosis not present

## 2015-09-28 DIAGNOSIS — E559 Vitamin D deficiency, unspecified: Secondary | ICD-10-CM | POA: Diagnosis not present

## 2015-09-29 DIAGNOSIS — G933 Postviral fatigue syndrome: Secondary | ICD-10-CM | POA: Diagnosis not present

## 2015-09-30 DIAGNOSIS — G933 Postviral fatigue syndrome: Secondary | ICD-10-CM | POA: Diagnosis not present

## 2015-10-01 DIAGNOSIS — F064 Anxiety disorder due to known physiological condition: Secondary | ICD-10-CM | POA: Diagnosis not present

## 2015-10-01 DIAGNOSIS — G301 Alzheimer's disease with late onset: Secondary | ICD-10-CM | POA: Diagnosis not present

## 2015-10-01 DIAGNOSIS — G933 Postviral fatigue syndrome: Secondary | ICD-10-CM | POA: Diagnosis not present

## 2015-10-02 DIAGNOSIS — F064 Anxiety disorder due to known physiological condition: Secondary | ICD-10-CM | POA: Diagnosis not present

## 2015-10-02 DIAGNOSIS — G301 Alzheimer's disease with late onset: Secondary | ICD-10-CM | POA: Diagnosis not present

## 2015-10-02 DIAGNOSIS — I10 Essential (primary) hypertension: Secondary | ICD-10-CM | POA: Diagnosis not present

## 2015-10-02 DIAGNOSIS — G933 Postviral fatigue syndrome: Secondary | ICD-10-CM | POA: Diagnosis not present

## 2015-10-02 DIAGNOSIS — F0280 Dementia in other diseases classified elsewhere without behavioral disturbance: Secondary | ICD-10-CM | POA: Diagnosis not present

## 2015-10-02 DIAGNOSIS — E785 Hyperlipidemia, unspecified: Secondary | ICD-10-CM | POA: Diagnosis not present

## 2015-10-02 DIAGNOSIS — Z9181 History of falling: Secondary | ICD-10-CM | POA: Diagnosis not present

## 2015-10-02 DIAGNOSIS — M810 Age-related osteoporosis without current pathological fracture: Secondary | ICD-10-CM | POA: Diagnosis not present

## 2015-10-03 DIAGNOSIS — G933 Postviral fatigue syndrome: Secondary | ICD-10-CM | POA: Diagnosis not present

## 2015-10-04 DIAGNOSIS — G301 Alzheimer's disease with late onset: Secondary | ICD-10-CM | POA: Diagnosis not present

## 2015-10-04 DIAGNOSIS — Z9181 History of falling: Secondary | ICD-10-CM | POA: Diagnosis not present

## 2015-10-04 DIAGNOSIS — M810 Age-related osteoporosis without current pathological fracture: Secondary | ICD-10-CM | POA: Diagnosis not present

## 2015-10-04 DIAGNOSIS — F064 Anxiety disorder due to known physiological condition: Secondary | ICD-10-CM | POA: Diagnosis not present

## 2015-10-04 DIAGNOSIS — G933 Postviral fatigue syndrome: Secondary | ICD-10-CM | POA: Diagnosis not present

## 2015-10-04 DIAGNOSIS — F0280 Dementia in other diseases classified elsewhere without behavioral disturbance: Secondary | ICD-10-CM | POA: Diagnosis not present

## 2015-10-04 DIAGNOSIS — I10 Essential (primary) hypertension: Secondary | ICD-10-CM | POA: Diagnosis not present

## 2015-10-04 DIAGNOSIS — E785 Hyperlipidemia, unspecified: Secondary | ICD-10-CM | POA: Diagnosis not present

## 2015-10-05 DIAGNOSIS — G933 Postviral fatigue syndrome: Secondary | ICD-10-CM | POA: Diagnosis not present

## 2015-10-06 DIAGNOSIS — G933 Postviral fatigue syndrome: Secondary | ICD-10-CM | POA: Diagnosis not present

## 2015-10-07 DIAGNOSIS — G933 Postviral fatigue syndrome: Secondary | ICD-10-CM | POA: Diagnosis not present

## 2015-10-08 DIAGNOSIS — G933 Postviral fatigue syndrome: Secondary | ICD-10-CM | POA: Diagnosis not present

## 2015-10-09 DIAGNOSIS — I10 Essential (primary) hypertension: Secondary | ICD-10-CM | POA: Diagnosis not present

## 2015-10-09 DIAGNOSIS — E785 Hyperlipidemia, unspecified: Secondary | ICD-10-CM | POA: Diagnosis not present

## 2015-10-09 DIAGNOSIS — F064 Anxiety disorder due to known physiological condition: Secondary | ICD-10-CM | POA: Diagnosis not present

## 2015-10-09 DIAGNOSIS — M810 Age-related osteoporosis without current pathological fracture: Secondary | ICD-10-CM | POA: Diagnosis not present

## 2015-10-09 DIAGNOSIS — G933 Postviral fatigue syndrome: Secondary | ICD-10-CM | POA: Diagnosis not present

## 2015-10-09 DIAGNOSIS — G301 Alzheimer's disease with late onset: Secondary | ICD-10-CM | POA: Diagnosis not present

## 2015-10-09 DIAGNOSIS — Z9181 History of falling: Secondary | ICD-10-CM | POA: Diagnosis not present

## 2015-10-09 DIAGNOSIS — F0280 Dementia in other diseases classified elsewhere without behavioral disturbance: Secondary | ICD-10-CM | POA: Diagnosis not present

## 2015-10-10 DIAGNOSIS — G933 Postviral fatigue syndrome: Secondary | ICD-10-CM | POA: Diagnosis not present

## 2015-10-11 DIAGNOSIS — G933 Postviral fatigue syndrome: Secondary | ICD-10-CM | POA: Diagnosis not present

## 2015-10-12 DIAGNOSIS — G933 Postviral fatigue syndrome: Secondary | ICD-10-CM | POA: Diagnosis not present

## 2015-10-12 DIAGNOSIS — F0280 Dementia in other diseases classified elsewhere without behavioral disturbance: Secondary | ICD-10-CM | POA: Diagnosis not present

## 2015-10-12 DIAGNOSIS — F064 Anxiety disorder due to known physiological condition: Secondary | ICD-10-CM | POA: Diagnosis not present

## 2015-10-12 DIAGNOSIS — M810 Age-related osteoporosis without current pathological fracture: Secondary | ICD-10-CM | POA: Diagnosis not present

## 2015-10-12 DIAGNOSIS — E785 Hyperlipidemia, unspecified: Secondary | ICD-10-CM | POA: Diagnosis not present

## 2015-10-12 DIAGNOSIS — G301 Alzheimer's disease with late onset: Secondary | ICD-10-CM | POA: Diagnosis not present

## 2015-10-12 DIAGNOSIS — I10 Essential (primary) hypertension: Secondary | ICD-10-CM | POA: Diagnosis not present

## 2015-10-12 DIAGNOSIS — Z9181 History of falling: Secondary | ICD-10-CM | POA: Diagnosis not present

## 2015-10-13 DIAGNOSIS — G933 Postviral fatigue syndrome: Secondary | ICD-10-CM | POA: Diagnosis not present

## 2015-10-13 DIAGNOSIS — R32 Unspecified urinary incontinence: Secondary | ICD-10-CM | POA: Diagnosis not present

## 2015-10-13 DIAGNOSIS — F0390 Unspecified dementia without behavioral disturbance: Secondary | ICD-10-CM | POA: Diagnosis not present

## 2015-10-14 DIAGNOSIS — G933 Postviral fatigue syndrome: Secondary | ICD-10-CM | POA: Diagnosis not present

## 2015-10-15 DIAGNOSIS — G933 Postviral fatigue syndrome: Secondary | ICD-10-CM | POA: Diagnosis not present

## 2015-10-16 DIAGNOSIS — I10 Essential (primary) hypertension: Secondary | ICD-10-CM | POA: Diagnosis not present

## 2015-10-16 DIAGNOSIS — G301 Alzheimer's disease with late onset: Secondary | ICD-10-CM | POA: Diagnosis not present

## 2015-10-16 DIAGNOSIS — G933 Postviral fatigue syndrome: Secondary | ICD-10-CM | POA: Diagnosis not present

## 2015-10-16 DIAGNOSIS — Z9181 History of falling: Secondary | ICD-10-CM | POA: Diagnosis not present

## 2015-10-16 DIAGNOSIS — E785 Hyperlipidemia, unspecified: Secondary | ICD-10-CM | POA: Diagnosis not present

## 2015-10-16 DIAGNOSIS — M810 Age-related osteoporosis without current pathological fracture: Secondary | ICD-10-CM | POA: Diagnosis not present

## 2015-10-16 DIAGNOSIS — F064 Anxiety disorder due to known physiological condition: Secondary | ICD-10-CM | POA: Diagnosis not present

## 2015-10-16 DIAGNOSIS — F0280 Dementia in other diseases classified elsewhere without behavioral disturbance: Secondary | ICD-10-CM | POA: Diagnosis not present

## 2015-10-17 DIAGNOSIS — G933 Postviral fatigue syndrome: Secondary | ICD-10-CM | POA: Diagnosis not present

## 2015-10-17 DIAGNOSIS — G301 Alzheimer's disease with late onset: Secondary | ICD-10-CM | POA: Diagnosis not present

## 2015-10-17 DIAGNOSIS — F064 Anxiety disorder due to known physiological condition: Secondary | ICD-10-CM | POA: Diagnosis not present

## 2015-10-18 DIAGNOSIS — I10 Essential (primary) hypertension: Secondary | ICD-10-CM | POA: Diagnosis not present

## 2015-10-18 DIAGNOSIS — G301 Alzheimer's disease with late onset: Secondary | ICD-10-CM | POA: Diagnosis not present

## 2015-10-18 DIAGNOSIS — M810 Age-related osteoporosis without current pathological fracture: Secondary | ICD-10-CM | POA: Diagnosis not present

## 2015-10-18 DIAGNOSIS — F0280 Dementia in other diseases classified elsewhere without behavioral disturbance: Secondary | ICD-10-CM | POA: Diagnosis not present

## 2015-10-18 DIAGNOSIS — F064 Anxiety disorder due to known physiological condition: Secondary | ICD-10-CM | POA: Diagnosis not present

## 2015-10-18 DIAGNOSIS — Z9181 History of falling: Secondary | ICD-10-CM | POA: Diagnosis not present

## 2015-10-18 DIAGNOSIS — E785 Hyperlipidemia, unspecified: Secondary | ICD-10-CM | POA: Diagnosis not present

## 2015-10-18 DIAGNOSIS — G933 Postviral fatigue syndrome: Secondary | ICD-10-CM | POA: Diagnosis not present

## 2015-10-19 DIAGNOSIS — R946 Abnormal results of thyroid function studies: Secondary | ICD-10-CM | POA: Diagnosis not present

## 2015-10-19 DIAGNOSIS — R600 Localized edema: Secondary | ICD-10-CM | POA: Diagnosis not present

## 2015-10-19 DIAGNOSIS — I10 Essential (primary) hypertension: Secondary | ICD-10-CM | POA: Diagnosis not present

## 2015-10-19 DIAGNOSIS — H3530 Unspecified macular degeneration: Secondary | ICD-10-CM | POA: Diagnosis not present

## 2015-10-19 DIAGNOSIS — G933 Postviral fatigue syndrome: Secondary | ICD-10-CM | POA: Diagnosis not present

## 2015-10-19 DIAGNOSIS — R197 Diarrhea, unspecified: Secondary | ICD-10-CM | POA: Diagnosis not present

## 2015-10-19 DIAGNOSIS — M150 Primary generalized (osteo)arthritis: Secondary | ICD-10-CM | POA: Diagnosis not present

## 2015-10-19 DIAGNOSIS — R531 Weakness: Secondary | ICD-10-CM | POA: Diagnosis not present

## 2015-10-19 DIAGNOSIS — E0580 Other thyrotoxicosis without thyrotoxic crisis or storm: Secondary | ICD-10-CM | POA: Diagnosis not present

## 2015-10-19 DIAGNOSIS — E782 Mixed hyperlipidemia: Secondary | ICD-10-CM | POA: Diagnosis not present

## 2015-10-20 DIAGNOSIS — G933 Postviral fatigue syndrome: Secondary | ICD-10-CM | POA: Diagnosis not present

## 2015-10-21 DIAGNOSIS — G933 Postviral fatigue syndrome: Secondary | ICD-10-CM | POA: Diagnosis not present

## 2015-10-22 DIAGNOSIS — G933 Postviral fatigue syndrome: Secondary | ICD-10-CM | POA: Diagnosis not present

## 2015-10-23 DIAGNOSIS — I10 Essential (primary) hypertension: Secondary | ICD-10-CM | POA: Diagnosis not present

## 2015-10-23 DIAGNOSIS — G301 Alzheimer's disease with late onset: Secondary | ICD-10-CM | POA: Diagnosis not present

## 2015-10-23 DIAGNOSIS — E785 Hyperlipidemia, unspecified: Secondary | ICD-10-CM | POA: Diagnosis not present

## 2015-10-23 DIAGNOSIS — M810 Age-related osteoporosis without current pathological fracture: Secondary | ICD-10-CM | POA: Diagnosis not present

## 2015-10-23 DIAGNOSIS — F064 Anxiety disorder due to known physiological condition: Secondary | ICD-10-CM | POA: Diagnosis not present

## 2015-10-23 DIAGNOSIS — Z9181 History of falling: Secondary | ICD-10-CM | POA: Diagnosis not present

## 2015-10-23 DIAGNOSIS — F0280 Dementia in other diseases classified elsewhere without behavioral disturbance: Secondary | ICD-10-CM | POA: Diagnosis not present

## 2015-10-23 DIAGNOSIS — G933 Postviral fatigue syndrome: Secondary | ICD-10-CM | POA: Diagnosis not present

## 2015-10-24 DIAGNOSIS — G933 Postviral fatigue syndrome: Secondary | ICD-10-CM | POA: Diagnosis not present

## 2015-10-25 DIAGNOSIS — Z9181 History of falling: Secondary | ICD-10-CM | POA: Diagnosis not present

## 2015-10-25 DIAGNOSIS — G933 Postviral fatigue syndrome: Secondary | ICD-10-CM | POA: Diagnosis not present

## 2015-10-25 DIAGNOSIS — I10 Essential (primary) hypertension: Secondary | ICD-10-CM | POA: Diagnosis not present

## 2015-10-25 DIAGNOSIS — F064 Anxiety disorder due to known physiological condition: Secondary | ICD-10-CM | POA: Diagnosis not present

## 2015-10-25 DIAGNOSIS — E785 Hyperlipidemia, unspecified: Secondary | ICD-10-CM | POA: Diagnosis not present

## 2015-10-25 DIAGNOSIS — M810 Age-related osteoporosis without current pathological fracture: Secondary | ICD-10-CM | POA: Diagnosis not present

## 2015-10-25 DIAGNOSIS — G301 Alzheimer's disease with late onset: Secondary | ICD-10-CM | POA: Diagnosis not present

## 2015-10-25 DIAGNOSIS — F0280 Dementia in other diseases classified elsewhere without behavioral disturbance: Secondary | ICD-10-CM | POA: Diagnosis not present

## 2015-10-26 DIAGNOSIS — G933 Postviral fatigue syndrome: Secondary | ICD-10-CM | POA: Diagnosis not present

## 2015-10-27 DIAGNOSIS — G933 Postviral fatigue syndrome: Secondary | ICD-10-CM | POA: Diagnosis not present

## 2015-10-28 DIAGNOSIS — G933 Postviral fatigue syndrome: Secondary | ICD-10-CM | POA: Diagnosis not present

## 2015-10-29 DIAGNOSIS — F064 Anxiety disorder due to known physiological condition: Secondary | ICD-10-CM | POA: Diagnosis not present

## 2015-10-29 DIAGNOSIS — G933 Postviral fatigue syndrome: Secondary | ICD-10-CM | POA: Diagnosis not present

## 2015-10-29 DIAGNOSIS — G301 Alzheimer's disease with late onset: Secondary | ICD-10-CM | POA: Diagnosis not present

## 2015-10-30 DIAGNOSIS — I10 Essential (primary) hypertension: Secondary | ICD-10-CM | POA: Diagnosis not present

## 2015-10-30 DIAGNOSIS — G933 Postviral fatigue syndrome: Secondary | ICD-10-CM | POA: Diagnosis not present

## 2015-10-30 DIAGNOSIS — F064 Anxiety disorder due to known physiological condition: Secondary | ICD-10-CM | POA: Diagnosis not present

## 2015-10-30 DIAGNOSIS — Z9181 History of falling: Secondary | ICD-10-CM | POA: Diagnosis not present

## 2015-10-30 DIAGNOSIS — M810 Age-related osteoporosis without current pathological fracture: Secondary | ICD-10-CM | POA: Diagnosis not present

## 2015-10-30 DIAGNOSIS — G301 Alzheimer's disease with late onset: Secondary | ICD-10-CM | POA: Diagnosis not present

## 2015-10-30 DIAGNOSIS — F0280 Dementia in other diseases classified elsewhere without behavioral disturbance: Secondary | ICD-10-CM | POA: Diagnosis not present

## 2015-10-30 DIAGNOSIS — E785 Hyperlipidemia, unspecified: Secondary | ICD-10-CM | POA: Diagnosis not present

## 2015-10-31 DIAGNOSIS — G933 Postviral fatigue syndrome: Secondary | ICD-10-CM | POA: Diagnosis not present

## 2015-11-01 DIAGNOSIS — I10 Essential (primary) hypertension: Secondary | ICD-10-CM | POA: Diagnosis not present

## 2015-11-01 DIAGNOSIS — G933 Postviral fatigue syndrome: Secondary | ICD-10-CM | POA: Diagnosis not present

## 2015-11-01 DIAGNOSIS — F0280 Dementia in other diseases classified elsewhere without behavioral disturbance: Secondary | ICD-10-CM | POA: Diagnosis not present

## 2015-11-01 DIAGNOSIS — E785 Hyperlipidemia, unspecified: Secondary | ICD-10-CM | POA: Diagnosis not present

## 2015-11-01 DIAGNOSIS — G301 Alzheimer's disease with late onset: Secondary | ICD-10-CM | POA: Diagnosis not present

## 2015-11-01 DIAGNOSIS — M810 Age-related osteoporosis without current pathological fracture: Secondary | ICD-10-CM | POA: Diagnosis not present

## 2015-11-01 DIAGNOSIS — Z9181 History of falling: Secondary | ICD-10-CM | POA: Diagnosis not present

## 2015-11-01 DIAGNOSIS — F064 Anxiety disorder due to known physiological condition: Secondary | ICD-10-CM | POA: Diagnosis not present

## 2015-11-02 DIAGNOSIS — G933 Postviral fatigue syndrome: Secondary | ICD-10-CM | POA: Diagnosis not present

## 2015-11-03 DIAGNOSIS — G933 Postviral fatigue syndrome: Secondary | ICD-10-CM | POA: Diagnosis not present

## 2015-11-04 DIAGNOSIS — G933 Postviral fatigue syndrome: Secondary | ICD-10-CM | POA: Diagnosis not present

## 2015-11-05 DIAGNOSIS — G933 Postviral fatigue syndrome: Secondary | ICD-10-CM | POA: Diagnosis not present

## 2015-11-06 DIAGNOSIS — G933 Postviral fatigue syndrome: Secondary | ICD-10-CM | POA: Diagnosis not present

## 2015-11-07 DIAGNOSIS — G933 Postviral fatigue syndrome: Secondary | ICD-10-CM | POA: Diagnosis not present

## 2015-11-08 DIAGNOSIS — G933 Postviral fatigue syndrome: Secondary | ICD-10-CM | POA: Diagnosis not present

## 2015-11-09 DIAGNOSIS — G933 Postviral fatigue syndrome: Secondary | ICD-10-CM | POA: Diagnosis not present

## 2015-11-09 DIAGNOSIS — I10 Essential (primary) hypertension: Secondary | ICD-10-CM | POA: Diagnosis not present

## 2015-11-10 DIAGNOSIS — G933 Postviral fatigue syndrome: Secondary | ICD-10-CM | POA: Diagnosis not present

## 2015-11-11 ENCOUNTER — Encounter: Payer: Self-pay | Admitting: Emergency Medicine

## 2015-11-11 ENCOUNTER — Emergency Department: Payer: Commercial Managed Care - HMO

## 2015-11-11 ENCOUNTER — Emergency Department
Admission: EM | Admit: 2015-11-11 | Discharge: 2015-11-11 | Disposition: A | Discharge status: Home / Self Care | Payer: Commercial Managed Care - HMO | Attending: Emergency Medicine | Admitting: Emergency Medicine

## 2015-11-11 DIAGNOSIS — S0990XA Unspecified injury of head, initial encounter: Secondary | ICD-10-CM | POA: Diagnosis not present

## 2015-11-11 DIAGNOSIS — R6889 Other general symptoms and signs: Secondary | ICD-10-CM | POA: Diagnosis not present

## 2015-11-11 DIAGNOSIS — Z87891 Personal history of nicotine dependence: Secondary | ICD-10-CM | POA: Insufficient documentation

## 2015-11-11 DIAGNOSIS — Z791 Long term (current) use of non-steroidal anti-inflammatories (NSAID): Secondary | ICD-10-CM | POA: Diagnosis not present

## 2015-11-11 DIAGNOSIS — Z7982 Long term (current) use of aspirin: Secondary | ICD-10-CM | POA: Diagnosis not present

## 2015-11-11 DIAGNOSIS — Z79899 Other long term (current) drug therapy: Secondary | ICD-10-CM | POA: Diagnosis not present

## 2015-11-11 DIAGNOSIS — G933 Postviral fatigue syndrome: Secondary | ICD-10-CM | POA: Diagnosis not present

## 2015-11-11 DIAGNOSIS — I10 Essential (primary) hypertension: Secondary | ICD-10-CM | POA: Diagnosis not present

## 2015-11-11 DIAGNOSIS — N390 Urinary tract infection, site not specified: Secondary | ICD-10-CM | POA: Diagnosis not present

## 2015-11-11 DIAGNOSIS — R531 Weakness: Secondary | ICD-10-CM

## 2015-11-11 DIAGNOSIS — R410 Disorientation, unspecified: Secondary | ICD-10-CM | POA: Diagnosis not present

## 2015-11-11 DIAGNOSIS — F0390 Unspecified dementia without behavioral disturbance: Secondary | ICD-10-CM | POA: Insufficient documentation

## 2015-11-11 LAB — URINALYSIS COMPLETE WITH MICROSCOPIC (ARMC ONLY)
Bilirubin Urine: NEGATIVE
GLUCOSE, UA: NEGATIVE mg/dL
Hgb urine dipstick: NEGATIVE
Ketones, ur: NEGATIVE mg/dL
Nitrite: POSITIVE — AB
PROTEIN: 30 mg/dL — AB
Specific Gravity, Urine: 1.013 (ref 1.005–1.030)
pH: 6 (ref 5.0–8.0)

## 2015-11-11 LAB — CBC WITH DIFFERENTIAL/PLATELET
Basophils Absolute: 0.1 10*3/uL (ref 0–0.1)
Basophils Relative: 0 %
EOS PCT: 1 %
Eosinophils Absolute: 0.1 10*3/uL (ref 0–0.7)
HEMATOCRIT: 40.5 % (ref 35.0–47.0)
Hemoglobin: 14 g/dL (ref 12.0–16.0)
LYMPHS ABS: 1.1 10*3/uL (ref 1.0–3.6)
LYMPHS PCT: 9 %
MCH: 31.5 pg (ref 26.0–34.0)
MCHC: 34.6 g/dL (ref 32.0–36.0)
MCV: 91 fL (ref 80.0–100.0)
MONO ABS: 0.9 10*3/uL (ref 0.2–0.9)
Monocytes Relative: 8 %
NEUTROS ABS: 9.8 10*3/uL — AB (ref 1.4–6.5)
Neutrophils Relative %: 82 %
PLATELETS: 271 10*3/uL (ref 150–440)
RBC: 4.46 MIL/uL (ref 3.80–5.20)
RDW: 14.3 % (ref 11.5–14.5)
WBC: 12 10*3/uL — ABNORMAL HIGH (ref 3.6–11.0)

## 2015-11-11 LAB — COMPREHENSIVE METABOLIC PANEL
ALT: 18 U/L (ref 14–54)
AST: 28 U/L (ref 15–41)
Albumin: 3.8 g/dL (ref 3.5–5.0)
Alkaline Phosphatase: 97 U/L (ref 38–126)
Anion gap: 10 (ref 5–15)
BILIRUBIN TOTAL: 0.6 mg/dL (ref 0.3–1.2)
BUN: 17 mg/dL (ref 6–20)
CHLORIDE: 98 mmol/L — AB (ref 101–111)
CO2: 30 mmol/L (ref 22–32)
CREATININE: 1.01 mg/dL — AB (ref 0.44–1.00)
Calcium: 9.3 mg/dL (ref 8.9–10.3)
GFR, EST AFRICAN AMERICAN: 55 mL/min — AB (ref >60)
GFR, EST NON AFRICAN AMERICAN: 48 mL/min — AB (ref >60)
Glucose, Bld: 97 mg/dL (ref 65–99)
Potassium: 3.9 mmol/L (ref 3.5–5.1)
Sodium: 138 mmol/L (ref 135–145)
TOTAL PROTEIN: 7.9 g/dL (ref 6.5–8.1)

## 2015-11-11 LAB — TROPONIN I

## 2015-11-11 MED ORDER — CEPHALEXIN 500 MG PO CAPS: 500.0000 mg | ORAL_CAPSULE | Freq: Two times a day (BID) | ORAL | 0 refills | Status: AC

## 2015-11-11 MED ORDER — DEXTROSE 5 % IV SOLN
1.0000 g | Freq: Once | INTRAVENOUS | Status: AC
  Administered 2015-11-11: 1 g via INTRAVENOUS
  Filled 2015-11-11: qty 10

## 2015-11-11 MED ORDER — SODIUM CHLORIDE 0.9 % IV BOLUS (SEPSIS)
500.0000 mL | Freq: Once | INTRAVENOUS | Status: AC
Start: 2015-11-11 — End: 2015-11-11
  Administered 2015-11-11: 500 mL via INTRAVENOUS

## 2015-11-11 NOTE — ED Provider Notes (Signed)
[facility] Emergency Department Provider Note   ____________________________________________   First MD Initiated Contact with Patient 11/11/15 1113     (approximate)  I have reviewed the triage vital signs and the nursing notes.   HISTORY  Chief Complaint Weakness  Caveat-history of present illness and review of systems is limited due to the patient's dementia. Information is obtained partly from the patient as well as staff at [facility].  HPI Sarah [name] is a 82 y.o. female with history of dementia, hypertension who presents from her assisted living facility due to generalized weakness today. According to staff, she was unable to stand and help to dress herself as she normally does. When she tried to stand up and get off the toilet, she was unable to do so. Staff reports that she has otherwise been in her usual state of health without illness. No vomiting, diarrhea, fevers or chills. The patient denies any pain complaints, she reports that she does not feel ill, she is not sure why she was brought to the emergency department.   Past Medical History:  Diagnosis Date  . Dementia   . Fall   . Hyperlipidemia   . Hypertension   . Macular degeneration   . Osteoporosis     There are no active problems to display for this patient.   History reviewed. No pertinent surgical history.  Prior to Admission medications   Medication Sig Start Date End Date Taking? Authorizing Provider  acetaminophen (TYLENOL) 325 MG tablet Take 650 mg by mouth every 4 (four) hours as needed.    Historical Provider, MD  acetaminophen (TYLENOL) 500 MG tablet Take 500 mg by mouth 2 (two) times daily.    Historical Provider, MD  ALPRAZolam Duanne Moron) 0.25 MG tablet Take 0.25 mg by mouth 3 (three) times daily as needed for anxiety.    Historical Provider, MD  amLODipine (NORVASC) 5 MG tablet Take 5 mg by mouth daily.    Historical Provider, MD  aspirin 81 MG  chewable tablet Chew 81 mg by mouth daily.    Historical Provider, MD  Cholecalciferol (VITAMIN D-1000 MAX ST) 1000 units tablet Take 1 tablet by mouth daily.    Historical Provider, MD  donepezil (ARICEPT) 5 MG tablet Take 5 mg by mouth at bedtime.    Historical Provider, MD  ergocalciferol (VITAMIN D2) 50000 UNITS capsule Take 50,000 Units by mouth once a week.    Historical Provider, MD  escitalopram (LEXAPRO) 10 MG tablet Take 10 mg by mouth at bedtime.    Historical Provider, MD  Multiple Vitamins-Minerals (PRESERVISION AREDS 2) CAPS Take 1 capsule by mouth 2 (two) times daily.    Historical Provider, MD  oxyCODONE (OXY IR/ROXICODONE) 5 MG immediate release tablet Take 5 mg by mouth every 6 (six) hours as needed for severe pain.    Historical Provider, MD    Allergies Review of patient's allergies indicates no known allergies.  No family history on file.  Social History Social History  Substance Use Topics  . Smoking status: Former Research scientist (life sciences)  . Smokeless tobacco: Not on file  . Alcohol use No    Review of Systems   Caveat-history of present illness and review of systems is limited due to the patient's dementia. Information is obtained partly from the patient as well as staff at [facility]. ____________________________________________   PHYSICAL EXAM:  Vitals:   11/11/15 1200 11/11/15 1230 11/11/15 1300 11/11/15 1330  BP: (!) 170/85 (!) 166/129 Marland Kitchen)  170/146 (!) 160/53  Pulse: (!) 57 60 (!) 59 (!) 54  Resp: '12 13 16 16  '$ Temp:    97.5 F (36.4 C)  TempSrc:    Oral  SpO2: 98% 96% 100% 100%  Weight:      Height:        VITAL SIGNS: ED Triage Vitals  Enc Vitals Group     BP 11/11/15 1042 (!) 162/57     Pulse Rate 11/11/15 1042 63     Resp 11/11/15 1042 20     Temp 11/11/15 1042 97.6 F (36.4 C)     Temp Source 11/11/15 1042 Oral     SpO2 11/11/15 1042 94 %     Weight 11/11/15 1044 158 lb (71.7 kg)     Height 11/11/15 1044 '5\' 4"'$  (1.626 m)     Head  Circumference --      Peak Flow --      Pain Score --      Pain Loc --      Pain Edu? --      Excl. in [place]? --     Constitutional: Alert and orientedTo self and place but not to year, she does not know why she was brought to the emergency department. Well appearing and in no acute distress. Eyes: Conjunctivae are normal. PERRL. EOMI. Head: Atraumatic. Nose: No congestion/rhinnorhea. Mouth/Throat: Mucous membranes are moist.  Oropharynx non-erythematous. Neck: No stridor. Supple without meningismus. No midline tenderness to palpation. Cardiovascular: Normal rate, regular rhythm. Grossly normal heart sounds.  Good peripheral circulation. Respiratory: Normal respiratory effort.  No retractions. Lungs CTAB. Gastrointestinal: Soft and nontender. No distention. No CVA tenderness. Genitourinary: Deferred Musculoskeletal: No lower extremity tenderness nor edema.  No joint effusions. Neurologic:  Normal speech and language. No gross focal neurologic deficits are appreciated. No gait instability. Skin:  Skin is warm, dry and intact. No rash noted. Psychiatric: Mood and affect are normal. Speech and behavior are normal.  ____________________________________________   LABS (all labs ordered are listed, but only abnormal results are displayed)  Labs Reviewed  CBC WITH DIFFERENTIAL/PLATELET - Abnormal; Notable for the following:       Result Value   WBC 12.0 (*)    Neutro Abs 9.8 (*)    All other components within normal limits  COMPREHENSIVE METABOLIC PANEL - Abnormal; Notable for the following:    Chloride 98 (*)    Creatinine, Ser 1.01 (*)    GFR calc non Af Amer 48 (*)    GFR calc Af Amer 55 (*)    All other components within normal limits  URINALYSIS COMPLETEWITH MICROSCOPIC ([facility] ONLY) - Abnormal; Notable for the following:    Color, Urine YELLOW (*)    APPearance CLOUDY (*)    Protein, ur 30 (*)    Nitrite POSITIVE (*)    Leukocytes, UA 3+ (*)    Bacteria, UA MANY (*)     Squamous Epithelial / LPF 0-5 (*)    All other components within normal limits  TROPONIN I   ____________________________________________  EKG  ED ECG REPORT I, Joanne Gavel, the attending physician, personally viewed and interpreted this ECG.   Date: 11/11/2015  EKG Time: 10:48  Rate: 55  Rhythm: sinus bradycardia  Axis: normal  Intervals:none  ST&T Change: No acute ST elevation or acute ST depression.  ____________________________________________  RADIOLOGY  CXR IMPRESSION:  No acute abnormality. Aortic atherosclerosis.       CT head IMPRESSION:  1. No skull fracture or intracranial  hemorrhage.  2. Stable diffuse cerebral and cerebellar atrophy and chronic small  vessel white matter ischemic changes in both cerebral hemispheres.    ____________________________________________   PROCEDURES  Procedure(s) performed: None  Procedures  Critical Care performed: No  ____________________________________________   INITIAL IMPRESSION / ASSESSMENT AND PLAN / ED COURSE  Pertinent labs & imaging results that were available during my care of the patient were reviewed by me and considered in my medical decision making (see chart for details).  Sarah [name] is a 82 y.o. female with history of dementia, hypertension who presents from her assisted living facility due to generalized weakness today. On exam, she is very well-appearing and in no acute distress. Her vital signs are stable, she is afebrile. She is at her baseline in terms of mental status and has no acute complaints that she is confused as I would expect her to be in the setting of her dementia. We'll obtain screening labs, CT head, chest x-ray, urinalysis, give light IV fluids and reassess for disposition.  ----------------------------------------- 1:41 PM on 11/11/2015 -----------------------------------------  Patient continues to appear well. She has no complaints. CBC with mild leukocytosis, white  blood cell count is 12. CMP with very mild creatinine elevation of 1.01, she received IV fluids. Negative troponin. Chest X ray shows no acute cardio pulmonary abnormality. CT head shows no acute intracranial abnormality. Urinalysis is concerning for a nitrite positive urinary tract infection which I suspect is the cause of the patient's symptoms today. She received IV ceftriaxone here in the emergency department will be discharged with Keflex. She ambulates well with her walker and is only requiring light assistance and she is suitable for discharge back to her living facility. DC home with return precautions.  Clinical Course     ____________________________________________   FINAL CLINICAL IMPRESSION(S) / ED DIAGNOSES  Final diagnoses:  Weakness  UTI (lower urinary tract infection)      NEW MEDICATIONS STARTED DURING THIS VISIT:  New Prescriptions   No medications on file     Note:  This document was prepared using Dragon voice recognition software and may include unintentional dictation errors.    Joanne Gavel, MD 11/11/15 [phone]

## 2015-11-11 NOTE — ED Notes (Signed)
Pt transported to xray 

## 2015-11-11 NOTE — ED Notes (Signed)
Patient ambulated to restroom, one person assist. Pt at baseline

## 2015-11-11 NOTE — ED Triage Notes (Signed)
presents via ems from [place] s/p fall  Fell from commode  Increased weakness for about 1 week  Per staff uses a walker to get around but unable to get up by herself this am  More confused

## 2015-11-12 DIAGNOSIS — G933 Postviral fatigue syndrome: Secondary | ICD-10-CM | POA: Diagnosis not present

## 2015-11-13 DIAGNOSIS — G933 Postviral fatigue syndrome: Secondary | ICD-10-CM | POA: Diagnosis not present

## 2015-11-13 DIAGNOSIS — R32 Unspecified urinary incontinence: Secondary | ICD-10-CM | POA: Diagnosis not present

## 2015-11-13 DIAGNOSIS — F0390 Unspecified dementia without behavioral disturbance: Secondary | ICD-10-CM | POA: Diagnosis not present

## 2015-11-14 DIAGNOSIS — G933 Postviral fatigue syndrome: Secondary | ICD-10-CM | POA: Diagnosis not present

## 2015-11-15 DIAGNOSIS — G933 Postviral fatigue syndrome: Secondary | ICD-10-CM | POA: Diagnosis not present

## 2015-11-16 DIAGNOSIS — G933 Postviral fatigue syndrome: Secondary | ICD-10-CM | POA: Diagnosis not present

## 2015-11-17 DIAGNOSIS — G933 Postviral fatigue syndrome: Secondary | ICD-10-CM | POA: Diagnosis not present

## 2015-11-18 DIAGNOSIS — G933 Postviral fatigue syndrome: Secondary | ICD-10-CM | POA: Diagnosis not present

## 2015-11-19 DIAGNOSIS — G933 Postviral fatigue syndrome: Secondary | ICD-10-CM | POA: Diagnosis not present

## 2015-11-20 DIAGNOSIS — G933 Postviral fatigue syndrome: Secondary | ICD-10-CM | POA: Diagnosis not present

## 2015-11-21 DIAGNOSIS — G933 Postviral fatigue syndrome: Secondary | ICD-10-CM | POA: Diagnosis not present

## 2015-11-22 DIAGNOSIS — G933 Postviral fatigue syndrome: Secondary | ICD-10-CM | POA: Diagnosis not present

## 2015-11-23 DIAGNOSIS — G933 Postviral fatigue syndrome: Secondary | ICD-10-CM | POA: Diagnosis not present

## 2015-11-24 DIAGNOSIS — G933 Postviral fatigue syndrome: Secondary | ICD-10-CM | POA: Diagnosis not present

## 2015-11-25 DIAGNOSIS — G933 Postviral fatigue syndrome: Secondary | ICD-10-CM | POA: Diagnosis not present

## 2015-11-26 DIAGNOSIS — F064 Anxiety disorder due to known physiological condition: Secondary | ICD-10-CM | POA: Diagnosis not present

## 2015-11-26 DIAGNOSIS — G933 Postviral fatigue syndrome: Secondary | ICD-10-CM | POA: Diagnosis not present

## 2015-11-26 DIAGNOSIS — G301 Alzheimer's disease with late onset: Secondary | ICD-10-CM | POA: Diagnosis not present

## 2015-11-27 DIAGNOSIS — G933 Postviral fatigue syndrome: Secondary | ICD-10-CM | POA: Diagnosis not present

## 2015-11-28 DIAGNOSIS — G933 Postviral fatigue syndrome: Secondary | ICD-10-CM | POA: Diagnosis not present

## 2015-11-29 DIAGNOSIS — G933 Postviral fatigue syndrome: Secondary | ICD-10-CM | POA: Diagnosis not present

## 2015-11-30 DIAGNOSIS — G933 Postviral fatigue syndrome: Secondary | ICD-10-CM | POA: Diagnosis not present

## 2015-12-01 DIAGNOSIS — G933 Postviral fatigue syndrome: Secondary | ICD-10-CM | POA: Diagnosis not present

## 2015-12-02 DIAGNOSIS — G933 Postviral fatigue syndrome: Secondary | ICD-10-CM | POA: Diagnosis not present

## 2015-12-03 DIAGNOSIS — G933 Postviral fatigue syndrome: Secondary | ICD-10-CM | POA: Diagnosis not present

## 2015-12-04 DIAGNOSIS — G933 Postviral fatigue syndrome: Secondary | ICD-10-CM | POA: Diagnosis not present

## 2015-12-05 DIAGNOSIS — G933 Postviral fatigue syndrome: Secondary | ICD-10-CM | POA: Diagnosis not present

## 2015-12-06 DIAGNOSIS — G933 Postviral fatigue syndrome: Secondary | ICD-10-CM | POA: Diagnosis not present

## 2015-12-07 DIAGNOSIS — G933 Postviral fatigue syndrome: Secondary | ICD-10-CM | POA: Diagnosis not present

## 2015-12-08 DIAGNOSIS — G933 Postviral fatigue syndrome: Secondary | ICD-10-CM | POA: Diagnosis not present

## 2015-12-09 DIAGNOSIS — G933 Postviral fatigue syndrome: Secondary | ICD-10-CM | POA: Diagnosis not present

## 2015-12-10 DIAGNOSIS — G933 Postviral fatigue syndrome: Secondary | ICD-10-CM | POA: Diagnosis not present

## 2015-12-11 DIAGNOSIS — G933 Postviral fatigue syndrome: Secondary | ICD-10-CM | POA: Diagnosis not present

## 2015-12-12 DIAGNOSIS — G933 Postviral fatigue syndrome: Secondary | ICD-10-CM | POA: Diagnosis not present

## 2015-12-13 DIAGNOSIS — F0390 Unspecified dementia without behavioral disturbance: Secondary | ICD-10-CM | POA: Diagnosis not present

## 2015-12-13 DIAGNOSIS — R32 Unspecified urinary incontinence: Secondary | ICD-10-CM | POA: Diagnosis not present

## 2015-12-13 DIAGNOSIS — G933 Postviral fatigue syndrome: Secondary | ICD-10-CM | POA: Diagnosis not present

## 2015-12-14 DIAGNOSIS — G933 Postviral fatigue syndrome: Secondary | ICD-10-CM | POA: Diagnosis not present

## 2015-12-15 DIAGNOSIS — G933 Postviral fatigue syndrome: Secondary | ICD-10-CM | POA: Diagnosis not present

## 2015-12-16 DIAGNOSIS — G933 Postviral fatigue syndrome: Secondary | ICD-10-CM | POA: Diagnosis not present

## 2015-12-17 DIAGNOSIS — G933 Postviral fatigue syndrome: Secondary | ICD-10-CM | POA: Diagnosis not present

## 2015-12-18 DIAGNOSIS — G933 Postviral fatigue syndrome: Secondary | ICD-10-CM | POA: Diagnosis not present

## 2015-12-19 DIAGNOSIS — G933 Postviral fatigue syndrome: Secondary | ICD-10-CM | POA: Diagnosis not present

## 2015-12-20 DIAGNOSIS — E784 Other hyperlipidemia: Secondary | ICD-10-CM | POA: Diagnosis not present

## 2015-12-20 DIAGNOSIS — G309 Alzheimer's disease, unspecified: Secondary | ICD-10-CM | POA: Diagnosis not present

## 2015-12-20 DIAGNOSIS — M810 Age-related osteoporosis without current pathological fracture: Secondary | ICD-10-CM | POA: Diagnosis not present

## 2015-12-20 DIAGNOSIS — G933 Postviral fatigue syndrome: Secondary | ICD-10-CM | POA: Diagnosis not present

## 2015-12-20 DIAGNOSIS — I10 Essential (primary) hypertension: Secondary | ICD-10-CM | POA: Diagnosis not present

## 2015-12-21 DIAGNOSIS — G933 Postviral fatigue syndrome: Secondary | ICD-10-CM | POA: Diagnosis not present

## 2015-12-22 DIAGNOSIS — G933 Postviral fatigue syndrome: Secondary | ICD-10-CM | POA: Diagnosis not present

## 2015-12-23 DIAGNOSIS — G933 Postviral fatigue syndrome: Secondary | ICD-10-CM | POA: Diagnosis not present

## 2015-12-24 DIAGNOSIS — G933 Postviral fatigue syndrome: Secondary | ICD-10-CM | POA: Diagnosis not present

## 2015-12-25 DIAGNOSIS — G933 Postviral fatigue syndrome: Secondary | ICD-10-CM | POA: Diagnosis not present

## 2015-12-26 DIAGNOSIS — G933 Postviral fatigue syndrome: Secondary | ICD-10-CM | POA: Diagnosis not present

## 2015-12-27 DIAGNOSIS — G933 Postviral fatigue syndrome: Secondary | ICD-10-CM | POA: Diagnosis not present

## 2015-12-28 DIAGNOSIS — G933 Postviral fatigue syndrome: Secondary | ICD-10-CM | POA: Diagnosis not present

## 2015-12-29 DIAGNOSIS — G933 Postviral fatigue syndrome: Secondary | ICD-10-CM | POA: Diagnosis not present

## 2015-12-30 DIAGNOSIS — G933 Postviral fatigue syndrome: Secondary | ICD-10-CM | POA: Diagnosis not present

## 2015-12-31 DIAGNOSIS — G933 Postviral fatigue syndrome: Secondary | ICD-10-CM | POA: Diagnosis not present

## 2016-01-01 DIAGNOSIS — G933 Postviral fatigue syndrome: Secondary | ICD-10-CM | POA: Diagnosis not present

## 2016-01-02 DIAGNOSIS — G933 Postviral fatigue syndrome: Secondary | ICD-10-CM | POA: Diagnosis not present

## 2016-01-03 DIAGNOSIS — G933 Postviral fatigue syndrome: Secondary | ICD-10-CM | POA: Diagnosis not present

## 2016-01-04 DIAGNOSIS — G933 Postviral fatigue syndrome: Secondary | ICD-10-CM | POA: Diagnosis not present

## 2016-01-05 DIAGNOSIS — G933 Postviral fatigue syndrome: Secondary | ICD-10-CM | POA: Diagnosis not present

## 2016-01-06 DIAGNOSIS — G933 Postviral fatigue syndrome: Secondary | ICD-10-CM | POA: Diagnosis not present

## 2016-01-07 DIAGNOSIS — G933 Postviral fatigue syndrome: Secondary | ICD-10-CM | POA: Diagnosis not present

## 2016-01-08 DIAGNOSIS — G933 Postviral fatigue syndrome: Secondary | ICD-10-CM | POA: Diagnosis not present

## 2016-01-09 DIAGNOSIS — G933 Postviral fatigue syndrome: Secondary | ICD-10-CM | POA: Diagnosis not present

## 2016-01-09 DIAGNOSIS — F0390 Unspecified dementia without behavioral disturbance: Secondary | ICD-10-CM | POA: Diagnosis not present

## 2016-01-09 DIAGNOSIS — R32 Unspecified urinary incontinence: Secondary | ICD-10-CM | POA: Diagnosis not present

## 2016-01-10 DIAGNOSIS — G933 Postviral fatigue syndrome: Secondary | ICD-10-CM | POA: Diagnosis not present

## 2016-01-11 DIAGNOSIS — G933 Postviral fatigue syndrome: Secondary | ICD-10-CM | POA: Diagnosis not present

## 2016-01-12 DIAGNOSIS — G933 Postviral fatigue syndrome: Secondary | ICD-10-CM | POA: Diagnosis not present

## 2016-01-13 DIAGNOSIS — G933 Postviral fatigue syndrome: Secondary | ICD-10-CM | POA: Diagnosis not present

## 2016-01-14 DIAGNOSIS — G933 Postviral fatigue syndrome: Secondary | ICD-10-CM | POA: Diagnosis not present

## 2016-01-15 DIAGNOSIS — G933 Postviral fatigue syndrome: Secondary | ICD-10-CM | POA: Diagnosis not present

## 2016-01-16 DIAGNOSIS — G933 Postviral fatigue syndrome: Secondary | ICD-10-CM | POA: Diagnosis not present

## 2016-01-17 DIAGNOSIS — G933 Postviral fatigue syndrome: Secondary | ICD-10-CM | POA: Diagnosis not present

## 2016-01-18 DIAGNOSIS — G933 Postviral fatigue syndrome: Secondary | ICD-10-CM | POA: Diagnosis not present

## 2016-01-19 DIAGNOSIS — G933 Postviral fatigue syndrome: Secondary | ICD-10-CM | POA: Diagnosis not present

## 2016-01-20 DIAGNOSIS — G933 Postviral fatigue syndrome: Secondary | ICD-10-CM | POA: Diagnosis not present

## 2016-01-21 DIAGNOSIS — G933 Postviral fatigue syndrome: Secondary | ICD-10-CM | POA: Diagnosis not present

## 2016-01-22 DIAGNOSIS — G933 Postviral fatigue syndrome: Secondary | ICD-10-CM | POA: Diagnosis not present

## 2016-01-23 DIAGNOSIS — G933 Postviral fatigue syndrome: Secondary | ICD-10-CM | POA: Diagnosis not present

## 2016-01-24 DIAGNOSIS — I10 Essential (primary) hypertension: Secondary | ICD-10-CM | POA: Diagnosis not present

## 2016-01-24 DIAGNOSIS — E784 Other hyperlipidemia: Secondary | ICD-10-CM | POA: Diagnosis not present

## 2016-01-24 DIAGNOSIS — G933 Postviral fatigue syndrome: Secondary | ICD-10-CM | POA: Diagnosis not present

## 2016-01-24 DIAGNOSIS — Z9181 History of falling: Secondary | ICD-10-CM | POA: Diagnosis not present

## 2016-01-24 DIAGNOSIS — G309 Alzheimer's disease, unspecified: Secondary | ICD-10-CM | POA: Diagnosis not present

## 2016-01-25 DIAGNOSIS — G933 Postviral fatigue syndrome: Secondary | ICD-10-CM | POA: Diagnosis not present

## 2016-01-26 DIAGNOSIS — G933 Postviral fatigue syndrome: Secondary | ICD-10-CM | POA: Diagnosis not present

## 2016-01-27 DIAGNOSIS — G933 Postviral fatigue syndrome: Secondary | ICD-10-CM | POA: Diagnosis not present

## 2016-01-28 DIAGNOSIS — G933 Postviral fatigue syndrome: Secondary | ICD-10-CM | POA: Diagnosis not present

## 2016-01-29 DIAGNOSIS — G933 Postviral fatigue syndrome: Secondary | ICD-10-CM | POA: Diagnosis not present

## 2016-01-30 DIAGNOSIS — G933 Postviral fatigue syndrome: Secondary | ICD-10-CM | POA: Diagnosis not present

## 2016-01-31 DIAGNOSIS — G933 Postviral fatigue syndrome: Secondary | ICD-10-CM | POA: Diagnosis not present

## 2016-02-01 DIAGNOSIS — R32 Unspecified urinary incontinence: Secondary | ICD-10-CM | POA: Diagnosis not present

## 2016-02-01 DIAGNOSIS — F0390 Unspecified dementia without behavioral disturbance: Secondary | ICD-10-CM | POA: Diagnosis not present

## 2016-02-01 DIAGNOSIS — G933 Postviral fatigue syndrome: Secondary | ICD-10-CM | POA: Diagnosis not present

## 2016-02-02 DIAGNOSIS — G933 Postviral fatigue syndrome: Secondary | ICD-10-CM | POA: Diagnosis not present

## 2016-02-03 DIAGNOSIS — G933 Postviral fatigue syndrome: Secondary | ICD-10-CM | POA: Diagnosis not present

## 2016-02-04 DIAGNOSIS — G933 Postviral fatigue syndrome: Secondary | ICD-10-CM | POA: Diagnosis not present

## 2016-02-05 DIAGNOSIS — G933 Postviral fatigue syndrome: Secondary | ICD-10-CM | POA: Diagnosis not present

## 2016-02-06 DIAGNOSIS — G933 Postviral fatigue syndrome: Secondary | ICD-10-CM | POA: Diagnosis not present

## 2016-02-07 DIAGNOSIS — G933 Postviral fatigue syndrome: Secondary | ICD-10-CM | POA: Diagnosis not present

## 2016-02-08 DIAGNOSIS — G933 Postviral fatigue syndrome: Secondary | ICD-10-CM | POA: Diagnosis not present

## 2016-02-09 DIAGNOSIS — G933 Postviral fatigue syndrome: Secondary | ICD-10-CM | POA: Diagnosis not present

## 2016-02-10 DIAGNOSIS — G933 Postviral fatigue syndrome: Secondary | ICD-10-CM | POA: Diagnosis not present

## 2016-02-11 DIAGNOSIS — G933 Postviral fatigue syndrome: Secondary | ICD-10-CM | POA: Diagnosis not present

## 2016-02-12 DIAGNOSIS — G933 Postviral fatigue syndrome: Secondary | ICD-10-CM | POA: Diagnosis not present

## 2016-02-13 DIAGNOSIS — G933 Postviral fatigue syndrome: Secondary | ICD-10-CM | POA: Diagnosis not present

## 2016-02-14 DIAGNOSIS — G933 Postviral fatigue syndrome: Secondary | ICD-10-CM | POA: Diagnosis not present

## 2016-02-15 DIAGNOSIS — G933 Postviral fatigue syndrome: Secondary | ICD-10-CM | POA: Diagnosis not present

## 2016-02-16 DIAGNOSIS — G933 Postviral fatigue syndrome: Secondary | ICD-10-CM | POA: Diagnosis not present

## 2016-02-17 DIAGNOSIS — G933 Postviral fatigue syndrome: Secondary | ICD-10-CM | POA: Diagnosis not present

## 2016-02-18 DIAGNOSIS — G933 Postviral fatigue syndrome: Secondary | ICD-10-CM | POA: Diagnosis not present

## 2016-02-19 DIAGNOSIS — G933 Postviral fatigue syndrome: Secondary | ICD-10-CM | POA: Diagnosis not present

## 2016-02-20 DIAGNOSIS — G933 Postviral fatigue syndrome: Secondary | ICD-10-CM | POA: Diagnosis not present

## 2016-02-21 DIAGNOSIS — G933 Postviral fatigue syndrome: Secondary | ICD-10-CM | POA: Diagnosis not present

## 2016-02-22 DIAGNOSIS — G933 Postviral fatigue syndrome: Secondary | ICD-10-CM | POA: Diagnosis not present

## 2016-02-23 DIAGNOSIS — G933 Postviral fatigue syndrome: Secondary | ICD-10-CM | POA: Diagnosis not present

## 2016-02-24 DIAGNOSIS — G933 Postviral fatigue syndrome: Secondary | ICD-10-CM | POA: Diagnosis not present

## 2016-02-25 DIAGNOSIS — G933 Postviral fatigue syndrome: Secondary | ICD-10-CM | POA: Diagnosis not present

## 2016-02-26 DIAGNOSIS — G933 Postviral fatigue syndrome: Secondary | ICD-10-CM | POA: Diagnosis not present

## 2016-02-27 DIAGNOSIS — G933 Postviral fatigue syndrome: Secondary | ICD-10-CM | POA: Diagnosis not present

## 2016-02-28 DIAGNOSIS — G933 Postviral fatigue syndrome: Secondary | ICD-10-CM | POA: Diagnosis not present

## 2016-02-29 DIAGNOSIS — G933 Postviral fatigue syndrome: Secondary | ICD-10-CM | POA: Diagnosis not present

## 2016-03-01 DIAGNOSIS — G933 Postviral fatigue syndrome: Secondary | ICD-10-CM | POA: Diagnosis not present

## 2016-03-02 DIAGNOSIS — G933 Postviral fatigue syndrome: Secondary | ICD-10-CM | POA: Diagnosis not present

## 2016-03-03 DIAGNOSIS — G933 Postviral fatigue syndrome: Secondary | ICD-10-CM | POA: Diagnosis not present

## 2016-03-04 DIAGNOSIS — G933 Postviral fatigue syndrome: Secondary | ICD-10-CM | POA: Diagnosis not present

## 2016-03-05 DIAGNOSIS — G933 Postviral fatigue syndrome: Secondary | ICD-10-CM | POA: Diagnosis not present

## 2016-03-06 DIAGNOSIS — G933 Postviral fatigue syndrome: Secondary | ICD-10-CM | POA: Diagnosis not present

## 2016-03-07 DIAGNOSIS — G933 Postviral fatigue syndrome: Secondary | ICD-10-CM | POA: Diagnosis not present

## 2016-03-08 DIAGNOSIS — G933 Postviral fatigue syndrome: Secondary | ICD-10-CM | POA: Diagnosis not present

## 2016-03-09 DIAGNOSIS — G933 Postviral fatigue syndrome: Secondary | ICD-10-CM | POA: Diagnosis not present

## 2016-03-10 DIAGNOSIS — G933 Postviral fatigue syndrome: Secondary | ICD-10-CM | POA: Diagnosis not present

## 2016-03-11 DIAGNOSIS — G933 Postviral fatigue syndrome: Secondary | ICD-10-CM | POA: Diagnosis not present

## 2016-03-12 DIAGNOSIS — G933 Postviral fatigue syndrome: Secondary | ICD-10-CM | POA: Diagnosis not present

## 2016-03-12 DIAGNOSIS — R32 Unspecified urinary incontinence: Secondary | ICD-10-CM | POA: Diagnosis not present

## 2016-03-12 DIAGNOSIS — F0390 Unspecified dementia without behavioral disturbance: Secondary | ICD-10-CM | POA: Diagnosis not present

## 2016-03-13 DIAGNOSIS — G933 Postviral fatigue syndrome: Secondary | ICD-10-CM | POA: Diagnosis not present

## 2016-03-14 DIAGNOSIS — G933 Postviral fatigue syndrome: Secondary | ICD-10-CM | POA: Diagnosis not present

## 2016-03-15 DIAGNOSIS — G933 Postviral fatigue syndrome: Secondary | ICD-10-CM | POA: Diagnosis not present

## 2016-03-16 DIAGNOSIS — G933 Postviral fatigue syndrome: Secondary | ICD-10-CM | POA: Diagnosis not present

## 2016-03-17 DIAGNOSIS — G933 Postviral fatigue syndrome: Secondary | ICD-10-CM | POA: Diagnosis not present

## 2016-03-18 DIAGNOSIS — G933 Postviral fatigue syndrome: Secondary | ICD-10-CM | POA: Diagnosis not present

## 2016-03-18 DIAGNOSIS — L84 Corns and callosities: Secondary | ICD-10-CM | POA: Diagnosis not present

## 2016-03-18 DIAGNOSIS — L602 Onychogryphosis: Secondary | ICD-10-CM | POA: Diagnosis not present

## 2016-03-19 DIAGNOSIS — G933 Postviral fatigue syndrome: Secondary | ICD-10-CM | POA: Diagnosis not present

## 2016-03-20 DIAGNOSIS — I10 Essential (primary) hypertension: Secondary | ICD-10-CM | POA: Diagnosis not present

## 2016-03-20 DIAGNOSIS — G309 Alzheimer's disease, unspecified: Secondary | ICD-10-CM | POA: Diagnosis not present

## 2016-03-20 DIAGNOSIS — G933 Postviral fatigue syndrome: Secondary | ICD-10-CM | POA: Diagnosis not present

## 2016-03-20 DIAGNOSIS — F419 Anxiety disorder, unspecified: Secondary | ICD-10-CM | POA: Diagnosis not present

## 2016-03-20 DIAGNOSIS — E785 Hyperlipidemia, unspecified: Secondary | ICD-10-CM | POA: Diagnosis not present

## 2016-03-21 DIAGNOSIS — G933 Postviral fatigue syndrome: Secondary | ICD-10-CM | POA: Diagnosis not present

## 2016-03-22 DIAGNOSIS — G933 Postviral fatigue syndrome: Secondary | ICD-10-CM | POA: Diagnosis not present

## 2016-03-23 DIAGNOSIS — G933 Postviral fatigue syndrome: Secondary | ICD-10-CM | POA: Diagnosis not present

## 2016-03-24 DIAGNOSIS — G933 Postviral fatigue syndrome: Secondary | ICD-10-CM | POA: Diagnosis not present

## 2016-03-25 DIAGNOSIS — G933 Postviral fatigue syndrome: Secondary | ICD-10-CM | POA: Diagnosis not present

## 2016-03-25 DIAGNOSIS — I10 Essential (primary) hypertension: Secondary | ICD-10-CM | POA: Diagnosis not present

## 2016-03-25 DIAGNOSIS — G309 Alzheimer's disease, unspecified: Secondary | ICD-10-CM | POA: Diagnosis not present

## 2016-03-25 DIAGNOSIS — E785 Hyperlipidemia, unspecified: Secondary | ICD-10-CM | POA: Diagnosis not present

## 2016-03-26 DIAGNOSIS — G933 Postviral fatigue syndrome: Secondary | ICD-10-CM | POA: Diagnosis not present

## 2016-03-27 DIAGNOSIS — G933 Postviral fatigue syndrome: Secondary | ICD-10-CM | POA: Diagnosis not present

## 2016-03-28 DIAGNOSIS — G933 Postviral fatigue syndrome: Secondary | ICD-10-CM | POA: Diagnosis not present

## 2016-03-29 DIAGNOSIS — G933 Postviral fatigue syndrome: Secondary | ICD-10-CM | POA: Diagnosis not present

## 2016-03-30 DIAGNOSIS — G933 Postviral fatigue syndrome: Secondary | ICD-10-CM | POA: Diagnosis not present

## 2016-03-31 DIAGNOSIS — G933 Postviral fatigue syndrome: Secondary | ICD-10-CM | POA: Diagnosis not present

## 2016-04-01 DIAGNOSIS — G933 Postviral fatigue syndrome: Secondary | ICD-10-CM | POA: Diagnosis not present

## 2016-04-02 DIAGNOSIS — G933 Postviral fatigue syndrome: Secondary | ICD-10-CM | POA: Diagnosis not present

## 2016-04-03 DIAGNOSIS — G933 Postviral fatigue syndrome: Secondary | ICD-10-CM | POA: Diagnosis not present

## 2016-04-04 DIAGNOSIS — G933 Postviral fatigue syndrome: Secondary | ICD-10-CM | POA: Diagnosis not present

## 2016-04-05 DIAGNOSIS — G933 Postviral fatigue syndrome: Secondary | ICD-10-CM | POA: Diagnosis not present

## 2016-04-06 DIAGNOSIS — G933 Postviral fatigue syndrome: Secondary | ICD-10-CM | POA: Diagnosis not present

## 2016-04-07 DIAGNOSIS — G933 Postviral fatigue syndrome: Secondary | ICD-10-CM | POA: Diagnosis not present

## 2016-04-08 DIAGNOSIS — G933 Postviral fatigue syndrome: Secondary | ICD-10-CM | POA: Diagnosis not present

## 2016-04-09 DIAGNOSIS — G933 Postviral fatigue syndrome: Secondary | ICD-10-CM | POA: Diagnosis not present

## 2016-04-10 DIAGNOSIS — G933 Postviral fatigue syndrome: Secondary | ICD-10-CM | POA: Diagnosis not present

## 2016-04-11 DIAGNOSIS — G933 Postviral fatigue syndrome: Secondary | ICD-10-CM | POA: Diagnosis not present

## 2016-04-12 DIAGNOSIS — G933 Postviral fatigue syndrome: Secondary | ICD-10-CM | POA: Diagnosis not present

## 2016-04-13 DIAGNOSIS — G933 Postviral fatigue syndrome: Secondary | ICD-10-CM | POA: Diagnosis not present

## 2016-04-14 DIAGNOSIS — G933 Postviral fatigue syndrome: Secondary | ICD-10-CM | POA: Diagnosis not present

## 2016-04-15 DIAGNOSIS — G933 Postviral fatigue syndrome: Secondary | ICD-10-CM | POA: Diagnosis not present

## 2016-04-16 DIAGNOSIS — R32 Unspecified urinary incontinence: Secondary | ICD-10-CM | POA: Diagnosis not present

## 2016-04-16 DIAGNOSIS — F0390 Unspecified dementia without behavioral disturbance: Secondary | ICD-10-CM | POA: Diagnosis not present

## 2016-04-16 DIAGNOSIS — G933 Postviral fatigue syndrome: Secondary | ICD-10-CM | POA: Diagnosis not present

## 2016-04-17 DIAGNOSIS — I10 Essential (primary) hypertension: Secondary | ICD-10-CM | POA: Diagnosis not present

## 2016-04-17 DIAGNOSIS — G309 Alzheimer's disease, unspecified: Secondary | ICD-10-CM | POA: Diagnosis not present

## 2016-04-17 DIAGNOSIS — G933 Postviral fatigue syndrome: Secondary | ICD-10-CM | POA: Diagnosis not present

## 2016-04-17 DIAGNOSIS — E785 Hyperlipidemia, unspecified: Secondary | ICD-10-CM | POA: Diagnosis not present

## 2016-04-18 DIAGNOSIS — G933 Postviral fatigue syndrome: Secondary | ICD-10-CM | POA: Diagnosis not present

## 2016-04-19 DIAGNOSIS — G933 Postviral fatigue syndrome: Secondary | ICD-10-CM | POA: Diagnosis not present

## 2016-04-20 DIAGNOSIS — G933 Postviral fatigue syndrome: Secondary | ICD-10-CM | POA: Diagnosis not present

## 2016-04-21 DIAGNOSIS — G933 Postviral fatigue syndrome: Secondary | ICD-10-CM | POA: Diagnosis not present

## 2016-04-22 DIAGNOSIS — G933 Postviral fatigue syndrome: Secondary | ICD-10-CM | POA: Diagnosis not present

## 2016-04-23 DIAGNOSIS — G933 Postviral fatigue syndrome: Secondary | ICD-10-CM | POA: Diagnosis not present

## 2016-04-24 DIAGNOSIS — G933 Postviral fatigue syndrome: Secondary | ICD-10-CM | POA: Diagnosis not present

## 2016-04-25 DIAGNOSIS — G933 Postviral fatigue syndrome: Secondary | ICD-10-CM | POA: Diagnosis not present

## 2016-04-26 DIAGNOSIS — G933 Postviral fatigue syndrome: Secondary | ICD-10-CM | POA: Diagnosis not present

## 2016-04-27 DIAGNOSIS — G933 Postviral fatigue syndrome: Secondary | ICD-10-CM | POA: Diagnosis not present

## 2016-04-28 DIAGNOSIS — G933 Postviral fatigue syndrome: Secondary | ICD-10-CM | POA: Diagnosis not present

## 2016-04-29 DIAGNOSIS — G933 Postviral fatigue syndrome: Secondary | ICD-10-CM | POA: Diagnosis not present

## 2016-04-30 DIAGNOSIS — G933 Postviral fatigue syndrome: Secondary | ICD-10-CM | POA: Diagnosis not present

## 2016-05-01 DIAGNOSIS — G933 Postviral fatigue syndrome: Secondary | ICD-10-CM | POA: Diagnosis not present

## 2016-05-02 DIAGNOSIS — G933 Postviral fatigue syndrome: Secondary | ICD-10-CM | POA: Diagnosis not present

## 2016-05-03 DIAGNOSIS — G933 Postviral fatigue syndrome: Secondary | ICD-10-CM | POA: Diagnosis not present

## 2016-05-04 DIAGNOSIS — G933 Postviral fatigue syndrome: Secondary | ICD-10-CM | POA: Diagnosis not present

## 2016-05-05 DIAGNOSIS — G933 Postviral fatigue syndrome: Secondary | ICD-10-CM | POA: Diagnosis not present

## 2016-05-06 DIAGNOSIS — G933 Postviral fatigue syndrome: Secondary | ICD-10-CM | POA: Diagnosis not present

## 2016-05-07 DIAGNOSIS — G933 Postviral fatigue syndrome: Secondary | ICD-10-CM | POA: Diagnosis not present

## 2016-05-08 DIAGNOSIS — G933 Postviral fatigue syndrome: Secondary | ICD-10-CM | POA: Diagnosis not present

## 2016-05-09 DIAGNOSIS — G933 Postviral fatigue syndrome: Secondary | ICD-10-CM | POA: Diagnosis not present

## 2016-05-10 DIAGNOSIS — G933 Postviral fatigue syndrome: Secondary | ICD-10-CM | POA: Diagnosis not present

## 2016-05-11 DIAGNOSIS — G933 Postviral fatigue syndrome: Secondary | ICD-10-CM | POA: Diagnosis not present

## 2016-05-12 DIAGNOSIS — G933 Postviral fatigue syndrome: Secondary | ICD-10-CM | POA: Diagnosis not present

## 2016-05-13 DIAGNOSIS — G933 Postviral fatigue syndrome: Secondary | ICD-10-CM | POA: Diagnosis not present

## 2016-05-13 DIAGNOSIS — R32 Unspecified urinary incontinence: Secondary | ICD-10-CM | POA: Diagnosis not present

## 2016-05-13 DIAGNOSIS — F0390 Unspecified dementia without behavioral disturbance: Secondary | ICD-10-CM | POA: Diagnosis not present

## 2016-05-14 DIAGNOSIS — G309 Alzheimer's disease, unspecified: Secondary | ICD-10-CM | POA: Diagnosis not present

## 2016-05-14 DIAGNOSIS — I10 Essential (primary) hypertension: Secondary | ICD-10-CM | POA: Diagnosis not present

## 2016-05-14 DIAGNOSIS — E785 Hyperlipidemia, unspecified: Secondary | ICD-10-CM | POA: Diagnosis not present

## 2016-05-14 DIAGNOSIS — G933 Postviral fatigue syndrome: Secondary | ICD-10-CM | POA: Diagnosis not present

## 2016-05-15 DIAGNOSIS — G933 Postviral fatigue syndrome: Secondary | ICD-10-CM | POA: Diagnosis not present

## 2016-05-16 DIAGNOSIS — G933 Postviral fatigue syndrome: Secondary | ICD-10-CM | POA: Diagnosis not present

## 2016-05-17 DIAGNOSIS — G933 Postviral fatigue syndrome: Secondary | ICD-10-CM | POA: Diagnosis not present

## 2016-05-18 DIAGNOSIS — G933 Postviral fatigue syndrome: Secondary | ICD-10-CM | POA: Diagnosis not present

## 2016-05-19 DIAGNOSIS — G933 Postviral fatigue syndrome: Secondary | ICD-10-CM | POA: Diagnosis not present

## 2016-05-20 DIAGNOSIS — G933 Postviral fatigue syndrome: Secondary | ICD-10-CM | POA: Diagnosis not present

## 2016-05-21 DIAGNOSIS — G933 Postviral fatigue syndrome: Secondary | ICD-10-CM | POA: Diagnosis not present

## 2016-05-22 DIAGNOSIS — G933 Postviral fatigue syndrome: Secondary | ICD-10-CM | POA: Diagnosis not present

## 2016-05-23 DIAGNOSIS — G933 Postviral fatigue syndrome: Secondary | ICD-10-CM | POA: Diagnosis not present

## 2016-05-24 DIAGNOSIS — G933 Postviral fatigue syndrome: Secondary | ICD-10-CM | POA: Diagnosis not present

## 2016-05-25 DIAGNOSIS — G933 Postviral fatigue syndrome: Secondary | ICD-10-CM | POA: Diagnosis not present

## 2016-05-26 DIAGNOSIS — G933 Postviral fatigue syndrome: Secondary | ICD-10-CM | POA: Diagnosis not present

## 2016-05-27 DIAGNOSIS — G933 Postviral fatigue syndrome: Secondary | ICD-10-CM | POA: Diagnosis not present

## 2016-05-28 DIAGNOSIS — G933 Postviral fatigue syndrome: Secondary | ICD-10-CM | POA: Diagnosis not present

## 2016-05-29 DIAGNOSIS — G933 Postviral fatigue syndrome: Secondary | ICD-10-CM | POA: Diagnosis not present

## 2016-05-30 DIAGNOSIS — G933 Postviral fatigue syndrome: Secondary | ICD-10-CM | POA: Diagnosis not present

## 2016-05-31 DIAGNOSIS — G933 Postviral fatigue syndrome: Secondary | ICD-10-CM | POA: Diagnosis not present

## 2016-06-01 DIAGNOSIS — G933 Postviral fatigue syndrome: Secondary | ICD-10-CM | POA: Diagnosis not present

## 2016-06-02 DIAGNOSIS — G933 Postviral fatigue syndrome: Secondary | ICD-10-CM | POA: Diagnosis not present

## 2016-06-03 DIAGNOSIS — G933 Postviral fatigue syndrome: Secondary | ICD-10-CM | POA: Diagnosis not present

## 2016-06-04 DIAGNOSIS — G933 Postviral fatigue syndrome: Secondary | ICD-10-CM | POA: Diagnosis not present

## 2016-06-05 DIAGNOSIS — G933 Postviral fatigue syndrome: Secondary | ICD-10-CM | POA: Diagnosis not present

## 2016-06-06 DIAGNOSIS — G933 Postviral fatigue syndrome: Secondary | ICD-10-CM | POA: Diagnosis not present

## 2016-06-07 DIAGNOSIS — G933 Postviral fatigue syndrome: Secondary | ICD-10-CM | POA: Diagnosis not present

## 2016-06-08 DIAGNOSIS — G933 Postviral fatigue syndrome: Secondary | ICD-10-CM | POA: Diagnosis not present

## 2016-06-09 DIAGNOSIS — G933 Postviral fatigue syndrome: Secondary | ICD-10-CM | POA: Diagnosis not present

## 2016-06-10 DIAGNOSIS — G933 Postviral fatigue syndrome: Secondary | ICD-10-CM | POA: Diagnosis not present

## 2016-06-10 DIAGNOSIS — F0390 Unspecified dementia without behavioral disturbance: Secondary | ICD-10-CM | POA: Diagnosis not present

## 2016-06-10 DIAGNOSIS — R32 Unspecified urinary incontinence: Secondary | ICD-10-CM | POA: Diagnosis not present

## 2016-06-11 DIAGNOSIS — G933 Postviral fatigue syndrome: Secondary | ICD-10-CM | POA: Diagnosis not present

## 2016-06-12 DIAGNOSIS — G933 Postviral fatigue syndrome: Secondary | ICD-10-CM | POA: Diagnosis not present

## 2016-06-13 DIAGNOSIS — E785 Hyperlipidemia, unspecified: Secondary | ICD-10-CM | POA: Diagnosis not present

## 2016-06-13 DIAGNOSIS — G309 Alzheimer's disease, unspecified: Secondary | ICD-10-CM | POA: Diagnosis not present

## 2016-06-13 DIAGNOSIS — G933 Postviral fatigue syndrome: Secondary | ICD-10-CM | POA: Diagnosis not present

## 2016-06-13 DIAGNOSIS — I10 Essential (primary) hypertension: Secondary | ICD-10-CM | POA: Diagnosis not present

## 2016-06-14 DIAGNOSIS — G933 Postviral fatigue syndrome: Secondary | ICD-10-CM | POA: Diagnosis not present

## 2016-06-15 DIAGNOSIS — G933 Postviral fatigue syndrome: Secondary | ICD-10-CM | POA: Diagnosis not present

## 2016-06-16 DIAGNOSIS — G933 Postviral fatigue syndrome: Secondary | ICD-10-CM | POA: Diagnosis not present

## 2016-06-17 DIAGNOSIS — G933 Postviral fatigue syndrome: Secondary | ICD-10-CM | POA: Diagnosis not present

## 2016-06-18 DIAGNOSIS — G933 Postviral fatigue syndrome: Secondary | ICD-10-CM | POA: Diagnosis not present

## 2016-06-19 DIAGNOSIS — G933 Postviral fatigue syndrome: Secondary | ICD-10-CM | POA: Diagnosis not present

## 2016-06-20 DIAGNOSIS — G933 Postviral fatigue syndrome: Secondary | ICD-10-CM | POA: Diagnosis not present

## 2016-06-21 DIAGNOSIS — G933 Postviral fatigue syndrome: Secondary | ICD-10-CM | POA: Diagnosis not present

## 2016-06-22 DIAGNOSIS — G933 Postviral fatigue syndrome: Secondary | ICD-10-CM | POA: Diagnosis not present

## 2016-06-23 DIAGNOSIS — G933 Postviral fatigue syndrome: Secondary | ICD-10-CM | POA: Diagnosis not present

## 2016-06-24 DIAGNOSIS — G933 Postviral fatigue syndrome: Secondary | ICD-10-CM | POA: Diagnosis not present

## 2016-06-25 DIAGNOSIS — G933 Postviral fatigue syndrome: Secondary | ICD-10-CM | POA: Diagnosis not present

## 2016-06-26 DIAGNOSIS — G933 Postviral fatigue syndrome: Secondary | ICD-10-CM | POA: Diagnosis not present

## 2016-06-27 DIAGNOSIS — G933 Postviral fatigue syndrome: Secondary | ICD-10-CM | POA: Diagnosis not present

## 2016-06-28 DIAGNOSIS — G933 Postviral fatigue syndrome: Secondary | ICD-10-CM | POA: Diagnosis not present

## 2016-06-29 DIAGNOSIS — G933 Postviral fatigue syndrome: Secondary | ICD-10-CM | POA: Diagnosis not present

## 2016-06-30 ENCOUNTER — Emergency Department
Admission: EM | Admit: 2016-06-30 | Discharge: 2016-06-30 | Disposition: A | Discharge status: Home / Self Care | Payer: Medicare HMO | Source: Home / Self Care | Attending: Emergency Medicine | Admitting: Emergency Medicine

## 2016-06-30 ENCOUNTER — Emergency Department: Payer: Medicare HMO

## 2016-06-30 ENCOUNTER — Encounter: Payer: Self-pay | Admitting: Emergency Medicine

## 2016-06-30 DIAGNOSIS — Z66 Do not resuscitate: Secondary | ICD-10-CM | POA: Diagnosis present

## 2016-06-30 DIAGNOSIS — E0500 Thyrotoxicosis with diffuse goiter without thyrotoxic crisis or storm: Secondary | ICD-10-CM | POA: Diagnosis present

## 2016-06-30 DIAGNOSIS — C3490 Malignant neoplasm of unspecified part of unspecified bronchus or lung: Secondary | ICD-10-CM

## 2016-06-30 DIAGNOSIS — R112 Nausea with vomiting, unspecified: Secondary | ICD-10-CM | POA: Diagnosis not present

## 2016-06-30 DIAGNOSIS — F0390 Unspecified dementia without behavioral disturbance: Secondary | ICD-10-CM | POA: Diagnosis not present

## 2016-06-30 DIAGNOSIS — H3530 Unspecified macular degeneration: Secondary | ICD-10-CM | POA: Diagnosis not present

## 2016-06-30 DIAGNOSIS — R197 Diarrhea, unspecified: Principal | ICD-10-CM

## 2016-06-30 DIAGNOSIS — R0602 Shortness of breath: Secondary | ICD-10-CM | POA: Diagnosis not present

## 2016-06-30 DIAGNOSIS — E876 Hypokalemia: Secondary | ICD-10-CM | POA: Insufficient documentation

## 2016-06-30 DIAGNOSIS — I10 Essential (primary) hypertension: Secondary | ICD-10-CM

## 2016-06-30 DIAGNOSIS — R110 Nausea: Secondary | ICD-10-CM | POA: Diagnosis not present

## 2016-06-30 DIAGNOSIS — C3491 Malignant neoplasm of unspecified part of right bronchus or lung: Secondary | ICD-10-CM | POA: Insufficient documentation

## 2016-06-30 DIAGNOSIS — R42 Dizziness and giddiness: Secondary | ICD-10-CM | POA: Diagnosis not present

## 2016-06-30 DIAGNOSIS — M810 Age-related osteoporosis without current pathological fracture: Secondary | ICD-10-CM | POA: Diagnosis not present

## 2016-06-30 DIAGNOSIS — R29704 NIHSS score 4: Secondary | ICD-10-CM | POA: Diagnosis present

## 2016-06-30 DIAGNOSIS — I340 Nonrheumatic mitral (valve) insufficiency: Secondary | ICD-10-CM | POA: Diagnosis not present

## 2016-06-30 DIAGNOSIS — E860 Dehydration: Secondary | ICD-10-CM

## 2016-06-30 DIAGNOSIS — I639 Cerebral infarction, unspecified: Secondary | ICD-10-CM | POA: Diagnosis not present

## 2016-06-30 DIAGNOSIS — Z87891 Personal history of nicotine dependence: Secondary | ICD-10-CM | POA: Insufficient documentation

## 2016-06-30 DIAGNOSIS — Z79899 Other long term (current) drug therapy: Secondary | ICD-10-CM

## 2016-06-30 DIAGNOSIS — Z823 Family history of stroke: Secondary | ICD-10-CM | POA: Diagnosis not present

## 2016-06-30 DIAGNOSIS — C3492 Malignant neoplasm of unspecified part of left bronchus or lung: Secondary | ICD-10-CM | POA: Diagnosis not present

## 2016-06-30 DIAGNOSIS — F329 Major depressive disorder, single episode, unspecified: Secondary | ICD-10-CM | POA: Diagnosis not present

## 2016-06-30 DIAGNOSIS — F419 Anxiety disorder, unspecified: Secondary | ICD-10-CM | POA: Diagnosis not present

## 2016-06-30 DIAGNOSIS — R109 Unspecified abdominal pain: Secondary | ICD-10-CM | POA: Diagnosis not present

## 2016-06-30 DIAGNOSIS — E785 Hyperlipidemia, unspecified: Secondary | ICD-10-CM | POA: Diagnosis not present

## 2016-06-30 DIAGNOSIS — M47812 Spondylosis without myelopathy or radiculopathy, cervical region: Secondary | ICD-10-CM | POA: Diagnosis not present

## 2016-06-30 DIAGNOSIS — Z8673 Personal history of transient ischemic attack (TIA), and cerebral infarction without residual deficits: Secondary | ICD-10-CM | POA: Diagnosis not present

## 2016-06-30 DIAGNOSIS — G933 Postviral fatigue syndrome: Secondary | ICD-10-CM | POA: Diagnosis not present

## 2016-06-30 DIAGNOSIS — S0990XA Unspecified injury of head, initial encounter: Secondary | ICD-10-CM | POA: Diagnosis not present

## 2016-06-30 LAB — COMPREHENSIVE METABOLIC PANEL
ALBUMIN: 3.8 g/dL (ref 3.5–5.0)
ALK PHOS: 82 U/L (ref 38–126)
ALT: 12 U/L — ABNORMAL LOW (ref 14–54)
ANION GAP: 9 (ref 5–15)
AST: 28 U/L (ref 15–41)
BUN: 23 mg/dL — ABNORMAL HIGH (ref 6–20)
CALCIUM: 9.2 mg/dL (ref 8.9–10.3)
CHLORIDE: 100 mmol/L — AB (ref 101–111)
CO2: 31 mmol/L (ref 22–32)
Creatinine, Ser: 1.21 mg/dL — ABNORMAL HIGH (ref 0.44–1.00)
GFR calc non Af Amer: 38 mL/min — ABNORMAL LOW (ref >60)
GFR, EST AFRICAN AMERICAN: 44 mL/min — AB (ref >60)
GLUCOSE: 153 mg/dL — AB (ref 65–99)
POTASSIUM: 3.1 mmol/L — AB (ref 3.5–5.1)
SODIUM: 140 mmol/L (ref 135–145)
Total Bilirubin: 0.5 mg/dL (ref 0.3–1.2)
Total Protein: 7.6 g/dL (ref 6.5–8.1)

## 2016-06-30 LAB — URINALYSIS, COMPLETE (UACMP) WITH MICROSCOPIC
BACTERIA UA: NONE SEEN
BILIRUBIN URINE: NEGATIVE
Glucose, UA: NEGATIVE mg/dL
HGB URINE DIPSTICK: NEGATIVE
KETONES UR: NEGATIVE mg/dL
Leukocytes, UA: NEGATIVE
Nitrite: NEGATIVE
Protein, ur: NEGATIVE mg/dL
Specific Gravity, Urine: 1.016 (ref 1.005–1.030)
Squamous Epithelial / LPF: NONE SEEN
pH: 6 (ref 5.0–8.0)

## 2016-06-30 LAB — CBC
HEMATOCRIT: 40.3 % (ref 35.0–47.0)
HEMOGLOBIN: 13.8 g/dL (ref 12.0–16.0)
MCH: 31.6 pg (ref 26.0–34.0)
MCHC: 34.3 g/dL (ref 32.0–36.0)
MCV: 92 fL (ref 80.0–100.0)
Platelets: 321 10*3/uL (ref 150–440)
RBC: 4.38 MIL/uL (ref 3.80–5.20)
RDW: 14.4 % (ref 11.5–14.5)
WBC: 11 10*3/uL (ref 3.6–11.0)

## 2016-06-30 LAB — BASIC METABOLIC PANEL
ANION GAP: 7 (ref 5–15)
BUN: 21 mg/dL — AB (ref 6–20)
CALCIUM: 8.6 mg/dL — AB (ref 8.9–10.3)
CO2: 29 mmol/L (ref 22–32)
Chloride: 105 mmol/L (ref 101–111)
Creatinine, Ser: 0.97 mg/dL (ref 0.44–1.00)
GFR calc Af Amer: 58 mL/min — ABNORMAL LOW (ref >60)
GFR calc non Af Amer: 50 mL/min — ABNORMAL LOW (ref >60)
GLUCOSE: 94 mg/dL (ref 65–99)
Potassium: 4.8 mmol/L (ref 3.5–5.1)
Sodium: 141 mmol/L (ref 135–145)

## 2016-06-30 LAB — MAGNESIUM: Magnesium: 2 mg/dL (ref 1.7–2.4)

## 2016-06-30 LAB — LIPASE, BLOOD: LIPASE: 36 U/L (ref 11–51)

## 2016-06-30 LAB — TROPONIN I: Troponin I: <0.03 ng/mL (ref <0.03)

## 2016-06-30 MED ORDER — POTASSIUM CHLORIDE 10 MEQ/100ML IV SOLN
10.0000 meq | Freq: Once | INTRAVENOUS | Status: AC
Start: 2016-06-30 — End: 2016-06-30
  Administered 2016-06-30: 10 meq via INTRAVENOUS
  Filled 2016-06-30: qty 100

## 2016-06-30 MED ORDER — ONDANSETRON 4 MG PO TBDP: 4.0000 mg | ORAL_TABLET | Freq: Three times a day (TID) | ORAL | 0 refills | Status: DC | PRN

## 2016-06-30 MED ORDER — POTASSIUM CHLORIDE 20 MEQ PO PACK
40.0000 meq | PACK | Freq: Once | ORAL | Status: AC
  Administered 2016-06-30: 40 meq via ORAL
  Filled 2016-06-30: qty 2

## 2016-06-30 MED ORDER — SODIUM CHLORIDE 0.9 % IV BOLUS (SEPSIS)
1000.0000 mL | Freq: Once | INTRAVENOUS | Status: AC
Start: 2016-06-30 — End: 2016-06-30
  Administered 2016-06-30: 1000 mL via INTRAVENOUS

## 2016-06-30 MED ORDER — ONDANSETRON HCL 4 MG/2ML IJ SOLN
4.0000 mg | Freq: Once | INTRAMUSCULAR | Status: AC
  Administered 2016-06-30: 4 mg via INTRAVENOUS
  Filled 2016-06-30: qty 2

## 2016-06-30 NOTE — ED Provider Notes (Signed)
[facility] Emergency Department Provider Note  ____________________________________________  Time seen: Approximately 9:49 AM  I have reviewed the triage vital signs and the nursing notes.   HISTORY  Chief Complaint Emesis and Fatigue  Level 5 caveat:  Portions of the history and physical were unable to be obtained due to dementia   HPI Sarah [name] is a 83 y.o. female with a history of dementia, hypertension, hyperlipidemia who presents for evaluation of generalized weakness, vomiting, and diarrhea. Patient reports multiple daily episodes of NBNB emesis and watery diarrhea for a few days. NO abdominal pain, no cough, congestion, CP, SOB, abdominal pain, dysuria, hematuria. No fever. Symptoms have been constant, moderate in intensity. She is also complaining of generalized weakness. Patient recently finished cipro for UTI 3 days ago.   Past Medical History:  Diagnosis Date  . Dementia   . Fall   . Hyperlipidemia   . Hypertension   . Macular degeneration   . Osteoporosis     There are no active problems to display for this patient.   History reviewed. No pertinent surgical history.  Prior to Admission medications   Medication Sig Start Date End Date Taking? Authorizing Provider  acetaminophen (TYLENOL) 500 MG tablet Take 500 mg by mouth 2 (two) times daily.   Yes [provider]  amLODipine (NORVASC) 5 MG tablet Take 5 mg by mouth daily.   Yes [provider]  Cholecalciferol (VITAMIN D-1000 MAX ST) 1000 units tablet Take 1 tablet by mouth daily.   Yes [provider]  Cranberry 450 MG CAPS Take by mouth.   Yes [provider]  donepezil (ARICEPT) 5 MG tablet Take 5 mg by mouth at bedtime.   Yes [provider]  escitalopram (LEXAPRO) 10 MG tablet Take 10 mg by mouth at bedtime.   Yes [provider]  hydrochlorothiazide (HYDRODIURIL) 25 MG tablet Take 25 mg by mouth daily.   Yes [provider]  Melatonin 5 MG TABS Take 1 tablet by mouth at bedtime.   Yes [provider]  methimazole (TAPAZOLE) 10 MG tablet Take 10 mg by mouth 3 (three) times daily.   Yes [provider]  Multiple Vitamins-Minerals (PRESERVISION AREDS 2) CAPS Take 1 capsule by mouth 2 (two) times daily.   Yes [provider]  potassium chloride SA (K-DUR,KLOR-CON) 20 MEQ tablet Take 20 mEq by mouth 2 (two) times daily.   Yes [provider]  acetaminophen (TYLENOL) 325 MG tablet Take 650 mg by mouth every 4 (four) hours as needed.    [provider]  ALPRAZolam Duanne Moron) 0.25 MG tablet Take 0.25 mg by mouth 3 (three) times daily as needed for anxiety.    [provider]    Allergies Patient has no known allergies.  Family History Alzheimer's - father Diabetes - mother Heart attack - mother  Social History Social History  Substance Use Topics  . Smoking status: Former Research scientist (life sciences)  . Smokeless tobacco: Never Used  . Alcohol use No    Review of Systems  Constitutional: Negative for fever. + Generalized weakness Eyes: Negative for visual changes. ENT: Negative for sore throat. Neck: No neck pain  Cardiovascular: Negative for chest pain. Respiratory: Negative for shortness of breath. Gastrointestinal: Negative for abdominal pain. + vomiting and diarrhea. Genitourinary: Negative for dysuria. Musculoskeletal: Negative for back pain. Skin: Negative for rash. Neurological: Negative for headaches, weakness or numbness. Psych: No SI or HI  ____________________________________________   PHYSICAL EXAM:  VITAL  SIGNS: ED Triage Vitals  Enc Vitals Group     BP 06/30/16 0931 (!) 132/94     Pulse Rate 06/30/16 0931 67     Resp 06/30/16 0931 18     Temp 06/30/16 0931 97.6 F (36.4 C)     Temp Source 06/30/16 0931 Oral     SpO2 06/30/16 0931 100 %     Weight 06/30/16 0935 149 lb (67.6 kg)     Height 06/30/16 0935 '5\' 2"'$  (1.575 m)     Head  Circumference --      Peak Flow --      Pain Score 06/30/16 0937 10     Pain Loc --      Pain Edu? --      Excl. in [place]? --     Constitutional: Alert and oriented x 2. Well appearing and in no apparent distress. HEENT:      Head: Normocephalic and atraumatic.         Eyes: Conjunctivae are normal. Sclera is non-icteric. EOMI. PERRL      Mouth/Throat: Mucous membranes are moist.       Neck: Supple with no signs of meningismus. Cardiovascular: Regular rate and rhythm. No murmurs, gallops, or rubs. 2+ symmetrical distal pulses are present in all extremities. No JVD. Respiratory: Normal respiratory effort. Lungs are clear to auscultation bilaterally. No wheezes, crackles, or rhonchi.  Gastrointestinal: Soft, non tender, and non distended with positive bowel sounds. No rebound or guarding. Genitourinary: No CVA tenderness. Incontinent of [place] watery stool guaiac negative Musculoskeletal: Nontender with normal range of motion in all extremities. No edema, cyanosis, or erythema of extremities. Neurologic: Normal speech and language. Face is symmetric. Moving all extremities. No gross focal neurologic deficits are appreciated. Skin: Skin is warm, dry and intact. No rash noted. Psychiatric: Mood and affect are normal. Speech and behavior are normal.  ____________________________________________   LABS (all labs ordered are listed, but only abnormal results are displayed)  Labs Reviewed  COMPREHENSIVE METABOLIC PANEL - Abnormal; Notable for the following:       Result Value   Potassium 3.1 (*)    Chloride 100 (*)    Glucose, Bld 153 (*)    BUN 23 (*)    Creatinine, Ser 1.21 (*)    ALT 12 (*)    GFR calc non Af Amer 38 (*)    GFR calc Af Amer 44 (*)    All other components within normal limits  URINALYSIS, COMPLETE (UACMP) WITH MICROSCOPIC - Abnormal; Notable for the following:    Color, Urine YELLOW (*)    APPearance CLEAR (*)    All other components within normal limits  BASIC  METABOLIC PANEL - Abnormal; Notable for the following:    BUN 21 (*)    Calcium 8.6 (*)    GFR calc non Af Amer 50 (*)    GFR calc Af Amer 58 (*)    All other components within normal limits  LIPASE, BLOOD  CBC  TROPONIN I  MAGNESIUM   ____________________________________________  EKG  ED ECG REPORT I, Rudene Re, the attending physician, personally viewed and interpreted this ECG.  Normal sinus rhythm, rate of 66, normal intervals, normal axis, no ST elevations or depressions, T-wave inversion in aVL. No significant changes from prior from 2017 ____________________________________________  RADIOLOGY  CT chest: 1. Sub solid ground-glass mass in the apical segment right upper lobe, likely gradually progressive on chest radiographs dating back to 03/23/2010 and worrisome for primary bronchogenic carcinoma.  2. Pure ground-glass nodule in the superior segment right lower lobe. Continued attention on followup exams is warranted as low-grade adenocarcinoma can have this appearance. 3. Low-attenuation lesions in the pancreas, incompletely imaged. Follow-up CT or MR abdomen without and with contrast could be performed in 2 years as clinically appropriate, as cystic pancreatic neoplasm cannot be excluded. 4. Aortic atherosclerosis (ICD10-170.0). Three-vessel coronary artery calcification. 5. Enlarged, nodular and heterogeneous thyroid. ____________________________________________   PROCEDURES  Procedure(s) performed: None Procedures Critical Care performed:  None ____________________________________________   INITIAL IMPRESSION / ASSESSMENT AND PLAN / ED COURSE  83 y.o. female with a history of dementia, hypertension, hyperlipidemia who presents for evaluation of generalized weakness, vomiting, and diarrhea. Patient incontinent of stool in the emergency department which is watery, [place], guaiac-negative. Her vitals are within normal limits. Her abdomen is soft and  nontender, lungs are clear to auscultation. We'll check labs including CBC, CMP, urinalysis to evaluate for dehydration, infection, electrolyte abnormalities. We'll give IV Zofran and IV fluids.    ----------------------------------------- 10:54 AM on 06/30/2016 -----------------------------------------   OBSERVATION CARE: This patient is being placed under observation care for the following reasons: Dehydrated patient observed to administer fluids and ability to retain oral liquids. Labs showing AKI with creatinine of 1.21 (baseline 0.6), and hypokalemia. Will give IVF, supplement K and repeat BMP.    _________________________ 12:03 PM on 06/30/2016 -----------------------------------------  CT concerning for malignancy. Spoke with [name] who will be able to see patient for close f/u by the end of this week. Discussed these findings with patient's son Mr. Birttany Dechellis who is patient's only child and patient has no husband and patient has dementia. Son understood patient's diagnosis and need for follow up. He will be able to take patient this week to see [name] for close f/u for establishment of care. Patient no longer having vomiting in the ED. Will repeat BMP and PO challenge. UA pending.  ----------------------------------------- 2:47 PM on 06/30/2016 -----------------------------------------   END OF OBSERVATION STATUS: After an appropriate period of observation, this patient is being discharged due to the following reason(s):  Creatinine and potassium are back to baseline. I spoke with the nursing supervisor at [facility] as well to make sure patient receives appropriate follow-up for the malignancy found on CT scan. Patient to be discharged back to her facility at this time.   Pertinent labs & imaging results that were available during my care of the patient were reviewed by me and considered in my medical decision making (see chart for  details).    ____________________________________________   FINAL CLINICAL IMPRESSION(S) / ED DIAGNOSES  Final diagnoses:  Nausea vomiting and diarrhea  Dehydration  Malignant neoplasm of lung, unspecified laterality, unspecified part of lung ([place])  Hypokalemia      NEW MEDICATIONS STARTED DURING THIS VISIT:  New Prescriptions   No medications on file     Note:  This document was prepared using Dragon voice recognition software and may include unintentional dictation errors.    Rudene Re, MD 06/30/16 [phone]

## 2016-06-30 NOTE — ED Notes (Signed)
Pt oxygen dropped to 86% on RA. Placed on 2 L nasal cannula. Will continue to monitor.

## 2016-06-30 NOTE — ED Triage Notes (Signed)
Patient presents to the ED via EMS from [facility].  Patient reports vomiting multiple times after waking up this morning.  Patient states, "I feel terrible."  Patient does not want to open her eyes during triage.  Patient is complaining of back pain when she lies flat and shortness of breath.

## 2016-06-30 NOTE — Discharge Instructions (Signed)
You must follow up with [name], oncology in 1 week for further evaluation of the lung mass found on CT. Continue to increase your oral hydration. Take zofran as needed for nause and vomiting.

## 2016-07-01 DIAGNOSIS — G933 Postviral fatigue syndrome: Secondary | ICD-10-CM | POA: Diagnosis not present

## 2016-07-02 ENCOUNTER — Emergency Department: Payer: Medicare HMO

## 2016-07-02 ENCOUNTER — Other Ambulatory Visit: Payer: Self-pay | Admitting: *Deleted

## 2016-07-02 ENCOUNTER — Encounter: Payer: Self-pay | Admitting: *Deleted

## 2016-07-02 ENCOUNTER — Inpatient Hospital Stay
Admission: EM | Admit: 2016-07-02 | Discharge: 2016-07-04 | DRG: 884 | Disposition: A | Discharge status: Home Health | Payer: Medicare HMO | Attending: Internal Medicine | Admitting: Internal Medicine

## 2016-07-02 DIAGNOSIS — F419 Anxiety disorder, unspecified: Secondary | ICD-10-CM | POA: Diagnosis present

## 2016-07-02 DIAGNOSIS — Z8673 Personal history of transient ischemic attack (TIA), and cerebral infarction without residual deficits: Secondary | ICD-10-CM

## 2016-07-02 DIAGNOSIS — I639 Cerebral infarction, unspecified: Secondary | ICD-10-CM | POA: Diagnosis present

## 2016-07-02 DIAGNOSIS — I10 Essential (primary) hypertension: Secondary | ICD-10-CM | POA: Diagnosis present

## 2016-07-02 DIAGNOSIS — R52 Pain, unspecified: Secondary | ICD-10-CM

## 2016-07-02 DIAGNOSIS — Z820 Family history of epilepsy and other diseases of the nervous system: Secondary | ICD-10-CM

## 2016-07-02 DIAGNOSIS — I340 Nonrheumatic mitral (valve) insufficiency: Secondary | ICD-10-CM | POA: Diagnosis not present

## 2016-07-02 DIAGNOSIS — Z87891 Personal history of nicotine dependence: Secondary | ICD-10-CM | POA: Diagnosis not present

## 2016-07-02 DIAGNOSIS — F0390 Unspecified dementia without behavioral disturbance: Principal | ICD-10-CM | POA: Diagnosis present

## 2016-07-02 DIAGNOSIS — E860 Dehydration: Secondary | ICD-10-CM | POA: Diagnosis present

## 2016-07-02 DIAGNOSIS — M810 Age-related osteoporosis without current pathological fracture: Secondary | ICD-10-CM | POA: Diagnosis present

## 2016-07-02 DIAGNOSIS — R29704 NIHSS score 4: Secondary | ICD-10-CM | POA: Diagnosis present

## 2016-07-02 DIAGNOSIS — E785 Hyperlipidemia, unspecified: Secondary | ICD-10-CM | POA: Diagnosis present

## 2016-07-02 DIAGNOSIS — Z66 Do not resuscitate: Secondary | ICD-10-CM | POA: Diagnosis present

## 2016-07-02 DIAGNOSIS — E876 Hypokalemia: Secondary | ICD-10-CM | POA: Diagnosis present

## 2016-07-02 DIAGNOSIS — H3530 Unspecified macular degeneration: Secondary | ICD-10-CM | POA: Diagnosis present

## 2016-07-02 DIAGNOSIS — Z8249 Family history of ischemic heart disease and other diseases of the circulatory system: Secondary | ICD-10-CM

## 2016-07-02 DIAGNOSIS — E0500 Thyrotoxicosis with diffuse goiter without thyrotoxic crisis or storm: Secondary | ICD-10-CM | POA: Diagnosis present

## 2016-07-02 DIAGNOSIS — Z823 Family history of stroke: Secondary | ICD-10-CM | POA: Diagnosis not present

## 2016-07-02 DIAGNOSIS — F329 Major depressive disorder, single episode, unspecified: Secondary | ICD-10-CM | POA: Diagnosis present

## 2016-07-02 DIAGNOSIS — Z833 Family history of diabetes mellitus: Secondary | ICD-10-CM

## 2016-07-02 DIAGNOSIS — C3490 Malignant neoplasm of unspecified part of unspecified bronchus or lung: Secondary | ICD-10-CM | POA: Diagnosis present

## 2016-07-02 LAB — CBC WITH DIFFERENTIAL/PLATELET
BASOS ABS: 0.1 10*3/uL (ref 0–0.1)
Basophils Relative: 1 %
EOS ABS: 0.2 10*3/uL (ref 0–0.7)
EOS PCT: 2 %
HCT: 36.4 % (ref 35.0–47.0)
HEMOGLOBIN: 12.4 g/dL (ref 12.0–16.0)
LYMPHS PCT: 20 %
Lymphs Abs: 2 10*3/uL (ref 1.0–3.6)
MCH: 31.5 pg (ref 26.0–34.0)
MCHC: 34.1 g/dL (ref 32.0–36.0)
MCV: 92.4 fL (ref 80.0–100.0)
Monocytes Absolute: 1 10*3/uL — ABNORMAL HIGH (ref 0.2–0.9)
Monocytes Relative: 10 %
NEUTROS PCT: 67 %
Neutro Abs: 6.5 10*3/uL (ref 1.4–6.5)
PLATELETS: 263 10*3/uL (ref 150–440)
RBC: 3.94 MIL/uL (ref 3.80–5.20)
RDW: 14.4 % (ref 11.5–14.5)
WBC: 9.7 10*3/uL (ref 3.6–11.0)

## 2016-07-02 LAB — BASIC METABOLIC PANEL
Anion gap: 9 (ref 5–15)
BUN: 14 mg/dL (ref 6–20)
CO2: 28 mmol/L (ref 22–32)
CREATININE: 0.86 mg/dL (ref 0.44–1.00)
Calcium: 9.2 mg/dL (ref 8.9–10.3)
Chloride: 100 mmol/L — ABNORMAL LOW (ref 101–111)
GFR, EST NON AFRICAN AMERICAN: 58 mL/min — AB (ref >60)
Glucose, Bld: 107 mg/dL — ABNORMAL HIGH (ref 65–99)
POTASSIUM: 3.9 mmol/L (ref 3.5–5.1)
SODIUM: 137 mmol/L (ref 135–145)

## 2016-07-02 LAB — URINALYSIS, COMPLETE (UACMP) WITH MICROSCOPIC
BACTERIA UA: NONE SEEN
Bilirubin Urine: NEGATIVE
Glucose, UA: NEGATIVE mg/dL
Hgb urine dipstick: NEGATIVE
Ketones, ur: NEGATIVE mg/dL
Leukocytes, UA: NEGATIVE
Nitrite: NEGATIVE
PROTEIN: NEGATIVE mg/dL
SPECIFIC GRAVITY, URINE: 1.004 — AB (ref 1.005–1.030)
Squamous Epithelial / LPF: NONE SEEN
pH: 5 (ref 5.0–8.0)

## 2016-07-02 LAB — TROPONIN I

## 2016-07-02 MED ORDER — VITAMIN D 1000 UNITS PO TABS
1000.0000 [IU] | ORAL_TABLET | Freq: Every day | ORAL | Status: DC
  Administered 2016-07-03 – 2016-07-04 (×2): 1000 [IU] via ORAL
  Filled 2016-07-02 (×2): qty 1

## 2016-07-02 MED ORDER — AMLODIPINE BESYLATE 10 MG PO TABS
10.0000 mg | ORAL_TABLET | Freq: Every day | ORAL | Status: DC
  Administered 2016-07-03 – 2016-07-04 (×2): 10 mg via ORAL
  Filled 2016-07-02 (×2): qty 1

## 2016-07-02 MED ORDER — ASPIRIN EC 81 MG PO TBEC
81.0000 mg | DELAYED_RELEASE_TABLET | Freq: Every day | ORAL | Status: DC
  Administered 2016-07-03 – 2016-07-04 (×2): 81 mg via ORAL
  Filled 2016-07-02 (×2): qty 1

## 2016-07-02 MED ORDER — ALPRAZOLAM 0.5 MG PO TABS
0.2500 mg | ORAL_TABLET | Freq: Three times a day (TID) | ORAL | Status: DC | PRN
  Administered 2016-07-03: 11:00:00 0.25 mg via ORAL
  Filled 2016-07-02: qty 1

## 2016-07-02 MED ORDER — METHIMAZOLE 10 MG PO TABS
10.0000 mg | ORAL_TABLET | Freq: Every day | ORAL | Status: DC
  Administered 2016-07-03 – 2016-07-04 (×2): 10 mg via ORAL
  Filled 2016-07-02 (×2): qty 1

## 2016-07-02 MED ORDER — OCUVITE-LUTEIN PO CAPS
1.0000 | ORAL_CAPSULE | Freq: Two times a day (BID) | ORAL | Status: DC
  Administered 2016-07-02 – 2016-07-04 (×4): 1 via ORAL
  Filled 2016-07-02 (×4): qty 1

## 2016-07-02 MED ORDER — POTASSIUM CHLORIDE CRYS ER 20 MEQ PO TBCR
20.0000 meq | EXTENDED_RELEASE_TABLET | Freq: Every day | ORAL | Status: DC
  Administered 2016-07-03 – 2016-07-04 (×2): 20 meq via ORAL
  Filled 2016-07-02 (×2): qty 1

## 2016-07-02 MED ORDER — HYDROCHLOROTHIAZIDE 25 MG PO TABS
25.0000 mg | ORAL_TABLET | Freq: Every day | ORAL | Status: DC
  Administered 2016-07-03 – 2016-07-04 (×2): 25 mg via ORAL
  Filled 2016-07-02 (×2): qty 1

## 2016-07-02 MED ORDER — ONDANSETRON HCL 4 MG PO TABS: 4.0000 mg | ORAL_TABLET | Freq: Four times a day (QID) | ORAL | Status: DC | PRN

## 2016-07-02 MED ORDER — ENOXAPARIN SODIUM 40 MG/0.4ML ~~LOC~~ SOLN
40.0000 mg | SUBCUTANEOUS | Status: DC
  Administered 2016-07-02 – 2016-07-03 (×2): 40 mg via SUBCUTANEOUS
  Filled 2016-07-02 (×2): qty 0.4

## 2016-07-02 MED ORDER — ASPIRIN 81 MG PO CHEW
81.0000 mg | CHEWABLE_TABLET | Freq: Once | ORAL | Status: AC
  Administered 2016-07-02: 81 mg via ORAL
  Filled 2016-07-02: qty 1

## 2016-07-02 MED ORDER — ACETAMINOPHEN 650 MG RE SUPP: 650.0000 mg | Freq: Four times a day (QID) | RECTAL | Status: DC | PRN

## 2016-07-02 MED ORDER — MELATONIN 5 MG PO TABS
1.0000 | ORAL_TABLET | Freq: Every day | ORAL | Status: DC
  Administered 2016-07-02: 5 mg via ORAL
  Filled 2016-07-02 (×4): qty 1

## 2016-07-02 MED ORDER — LOPERAMIDE HCL 2 MG PO CAPS: 2.0000 mg | ORAL_CAPSULE | Freq: Four times a day (QID) | ORAL | Status: DC | PRN

## 2016-07-02 MED ORDER — DONEPEZIL HCL 5 MG PO TABS
5.0000 mg | ORAL_TABLET | Freq: Every day | ORAL | Status: DC
  Administered 2016-07-02 – 2016-07-03 (×2): 5 mg via ORAL
  Filled 2016-07-02 (×2): qty 1

## 2016-07-02 MED ORDER — ESCITALOPRAM OXALATE 10 MG PO TABS
10.0000 mg | ORAL_TABLET | Freq: Every day | ORAL | Status: DC
  Administered 2016-07-03: 10 mg via ORAL
  Filled 2016-07-02: qty 1

## 2016-07-02 MED ORDER — ACETAMINOPHEN 325 MG PO TABS: 650.0000 mg | ORAL_TABLET | Freq: Four times a day (QID) | ORAL | Status: DC | PRN

## 2016-07-02 MED ORDER — ONDANSETRON HCL 4 MG/2ML IJ SOLN: 4.0000 mg | Freq: Four times a day (QID) | INTRAMUSCULAR | Status: DC | PRN

## 2016-07-02 NOTE — Progress Notes (Signed)
   Sarah [name]: 0 days Sarah [name] is a 83 y.o. female presenting with Fall w/ hx of dementia, hypertension, hyperlipidemia, osteoporosis.   Advance care planning discussed with patient  with additional Family at bedside. All questions in regards to overall condition and expected prognosis answered. The decision was made to change current code status  CODE STATUS: dnr Time spent: 15 minutes

## 2016-07-02 NOTE — ED Provider Notes (Signed)
[facility] Emergency Department Provider Note ____________________________________________   I have reviewed the triage vital signs and the triage nursing note.  HISTORY  Chief Complaint Fall   Historian Limited history from patient as she has a history of dementia, poor historian  HPI Sarah [name] is a 83 y.o. female presents from assisted living with history of dementia, reports sent with her that she had a fall 2 hours ago and was initially placed back in bed but was sent in due to complaining of head pain and dizziness.  Patient states that she has neck pain to me. States that she thinks that she fell because she tripped although she's not sure.  Denies active chest pain or trouble breathing.    Past Medical History:  Diagnosis Date  . Dementia   . Fall   . Hyperlipidemia   . Hypertension   . Macular degeneration   . Osteoporosis     There are no active problems to display for this patient.   History reviewed. No pertinent surgical history.  Prior to Admission medications   Medication Sig Start Date End Date Taking? Authorizing Provider  acetaminophen (TYLENOL) 325 MG tablet Take 650 mg by mouth every 4 (four) hours as needed.   Yes [provider]  acetaminophen (TYLENOL) 500 MG tablet Take 500 mg by mouth 2 (two) times daily.   Yes [provider]  ALPRAZolam (XANAX) 0.25 MG tablet Take 0.25 mg by mouth 3 (three) times daily as needed for anxiety.   Yes [provider]  amLODipine (NORVASC) 10 MG tablet Take 10 mg by mouth daily.    Yes [provider]  Cholecalciferol (VITAMIN D-1000 MAX ST) 1000 units tablet Take 1 tablet by mouth daily.   Yes [provider]  Cranberry 450 MG CAPS Take by mouth.   Yes [provider]  donepezil (ARICEPT) 5 MG tablet Take 5 mg by mouth at bedtime.   Yes [provider]  escitalopram (LEXAPRO) 10 MG tablet Take 10 mg by mouth at bedtime.   Yes  [provider]  hydrochlorothiazide (HYDRODIURIL) 25 MG tablet Take 25 mg by mouth daily.   Yes [provider]  loperamide (IMODIUM) 2 MG capsule Take 2 mg by mouth every 6 (six) hours as needed for diarrhea or loose stools.   Yes [provider]  Melatonin 5 MG TABS Take 1 tablet by mouth at bedtime.   Yes [provider]  methimazole (TAPAZOLE) 10 MG tablet Take 10 mg by mouth daily.    Yes [provider]  Multiple Vitamins-Minerals (PRESERVISION AREDS 2) CAPS Take 1 capsule by mouth 2 (two) times daily.   Yes [provider]  ondansetron (ZOFRAN ODT) 4 MG disintegrating tablet Take 1 tablet (4 mg total) by mouth every 8 (eight) hours as needed for nausea or vomiting. 06/30/16  Yes Alfred Levins, [place], MD  potassium chloride SA (K-DUR,KLOR-CON) 20 MEQ tablet Take 20 mEq by mouth daily.    Yes [provider]    No Known Allergies  History reviewed. No pertinent family history.  Social History Social History  Substance Use Topics  . Smoking status: Former Research scientist (life sciences)  . Smokeless tobacco: Never Used  . Alcohol use No    Review of Systems  Level V caveat due to patient poor historian ____________________________________________   PHYSICAL EXAM:  VITAL SIGNS: ED Triage Vitals [07/02/16 1621]  Enc Vitals Group     BP (!) 158/80     Pulse  Rate 70     Resp (!) 9     Temp 98.3 F (36.8 C)     Temp Source Oral     SpO2 96 %     Weight      Height      Head Circumference      Peak Flow      Pain Score      Pain Loc      Pain Edu?      Excl. in [place]?      Constitutional: Alert and Operative, but disoriented to time. Well appearing and in no distress. HEENT   Head: Normocephalic and atraumatic.      Eyes: Conjunctivae are normal. PERRL. Normal extraocular movements.      Ears:         Nose: No congestion/rhinnorhea.   Mouth/Throat: Mucous membranes are moist.   Neck: No stridor.  Mild posterior neck  pain without step-off. C-collar placed. Cardiovascular/Chest: Normal rate, regular rhythm.  No murmurs, rubs, or gallops. Respiratory: Normal respiratory effort without tachypnea nor retractions. Breath sounds are clear and equal bilaterally. No wheezes/rales/rhonchi. Gastrointestinal: Soft. No distention, no guarding, no rebound. Nontender.    Genitourinary/rectal:Deferred Musculoskeletal: Bruise of top of left hand without any tenderness.  Nontender with normal range of motion in all extremities. No joint effusions.  No lower extremity tenderness.  No edema. Neurologic:  No facial droop. No slurred speech. Poor historian. Moving 4 extremities. No sensory deficits. No apparent weakness. Skin:  Skin is warm, dry and intact. No rash noted. Psychiatric: No agitation..   ____________________________________________  LABS (pertinent positives/negatives)  Labs Reviewed  URINALYSIS, COMPLETE (UACMP) WITH MICROSCOPIC - Abnormal; Notable for the following:       Result Value   Color, Urine STRAW (*)    APPearance CLEAR (*)    Specific Gravity, Urine 1.004 (*)    All other components within normal limits  BASIC METABOLIC PANEL - Abnormal; Notable for the following:    Chloride 100 (*)    Glucose, Bld 107 (*)    GFR calc non Af Amer 58 (*)    All other components within normal limits  CBC WITH DIFFERENTIAL/PLATELET - Abnormal; Notable for the following:    Monocytes Absolute 1.0 (*)    All other components within normal limits  TROPONIN I    ____________________________________________    EKG I, Lisa Roca, MD, the attending physician have personally viewed and interpreted all ECGs.  67 bpm. Normal sinus rhythm. Narrow QRS. Normal axis. Normal ST and T-wave ____________________________________________  RADIOLOGY All Xrays were viewed by me. Imaging interpreted by Radiologist.  CT head and cervical spine without contrast:  IMPRESSION: CT BRAIN:  1. Focal hypodensity within  the parasagittal left occipital lobe, suspicious for acute/subacute left PCA territory ischemic infarct. 2. No other acute intracranial process identified. 3. Moderate cerebral atrophy with chronic microvascular ischemic disease.  CT CERVICAL SPINE:  1. No acute traumatic injury within cervical spine. 2. Moderate degenerative spondylolysis at C5-6 and C6-7. 3. Ground-glass opacity within the visualized right upper lobe, again concerning for possible neoplasm. This is better evaluated on recent CT from 06/30/2013. __________________________________________  PROCEDURES  Procedure(s) performed: None  Critical Care performed: None  ____________________________________________   ED COURSE / ASSESSMENT AND PLAN  Pertinent labs & imaging results that were available during my care of the patient were reviewed by me and considered in my medical decision making (see chart for details).   From a traumatic evaluation standpoint, c-collar was  placed due to patient complaining of neck pain, and CT head and C-spine were obtained. No other bony point tenderness on exam although she does have a mild bruise on her top of her left hand does not tender at all.  Unwitnessed fall, unclear etiology.  C-collar was removed after no acute findings on CT of the cervical spine. The CT of the head showed a likely acute versus subacute stroke, and given the possibility that this could've resulted in her unwitnessed fall, I am going give her baby aspirin as it appears that she's not any anticoagulation, admitted to hospitalist service for further investigation.    CONSULTATIONS:   Hospitalist for admission.   Patient / Family / Caregiver informed of clinical course, medical decision-making process, and agree with plan.     ___________________________________________   FINAL CLINICAL IMPRESSION(S) / ED DIAGNOSES   Final diagnoses:  Cerebrovascular accident (CVA), unspecified mechanism ([place])               Note: This dictation was prepared with Sales executive. Any transcriptional errors that result from this process are unintentional    Lisa Roca, MD 07/02/16 1819

## 2016-07-02 NOTE — ED Notes (Signed)
ASSISTED PT ON BEDPAN

## 2016-07-02 NOTE — ED Notes (Signed)
Patient on bedpan at this time.  Will continue to monitor.

## 2016-07-02 NOTE — ED Notes (Signed)
Called patient's son and notified him of plan to admit patient, per patient's request.

## 2016-07-02 NOTE — ED Triage Notes (Signed)
Pt arrives via [facility] from [facility] after a fall. Pt fell 2 hours ago, got back up and went back to bed. Staff sent pt out because of L hand bruising. Bruise is purple/pink. Pt has hx dementia. States "she wouldn't move and I lost my balance. I fall easy anyway." EMS reports pt walked with walker to stretcher out of room. Pt was c/o back pain, head pain, and dizziness. Pt is talking in complete sentences, laughing.

## 2016-07-02 NOTE — H&P (Signed)
[facility] [place] NAME: Sarah [name]    MR#:  [id number]  DATE OF BIRTH:  [birth year]  DATE OF ADMISSION:  07/02/2016  PRIMARY CARE PHYSICIAN: System, Provider Not In   REQUESTING/REFERRING PHYSICIAN: Dr. Lisa Roca  CHIEF COMPLAINT:   Chief Complaint  Patient presents with  . Fall    HISTORY OF PRESENT ILLNESS:  Sarah [name]  is a 83 y.o. female with a known history of Dementia, recurrent falls, macular degeneration, hypertension, hyperlipidemia, osteoporosis who presents to the hospital after a mechanical fall at the assisted living where she resides. Patient herself is a very poor historian given her dementia. She underwent a trauma workup including a CT head and CT cervical spine and the CT head was positive for a acute/subacute left-sided occipital CVA. Patient denies any blurry or double vision but it is really hard to assess that given her dementia. Patient denies any chest pain shortness of breath and numbness tingling nausea vomiting or any other associated symptoms. Given her CT scan findings hospitalist services were contacted for further treatment and evaluation.  PAST MEDICAL HISTORY:   Past Medical History:  Diagnosis Date  . Dementia   . Fall   . Hyperlipidemia   . Hypertension   . Macular degeneration   . Osteoporosis     PAST SURGICAL HISTORY:  History reviewed. No pertinent surgical history.  SOCIAL HISTORY:   Social History  Substance Use Topics  . Smoking status: Former Research scientist (life sciences)  . Smokeless tobacco: Never Used  . Alcohol use No    FAMILY HISTORY:   Family History  Problem Relation Age of Onset  . CVA Mother   . Dementia Father     DRUG ALLERGIES:  No Known Allergies  REVIEW OF SYSTEMS:   Review of Systems  Unable to perform ROS: Dementia    MEDICATIONS AT HOME:   Prior to Admission medications   Medication Sig Start Date End Date Taking? Authorizing Provider  acetaminophen (TYLENOL) 325 MG  tablet Take 650 mg by mouth every 4 (four) hours as needed.   Yes [provider]  acetaminophen (TYLENOL) 500 MG tablet Take 500 mg by mouth 2 (two) times daily.   Yes [provider]  ALPRAZolam (XANAX) 0.25 MG tablet Take 0.25 mg by mouth 3 (three) times daily as needed for anxiety.   Yes [provider]  amLODipine (NORVASC) 10 MG tablet Take 10 mg by mouth daily.    Yes [provider]  Cholecalciferol (VITAMIN D-1000 MAX ST) 1000 units tablet Take 1 tablet by mouth daily.   Yes [provider]  Cranberry 450 MG CAPS Take by mouth.   Yes [provider]  donepezil (ARICEPT) 5 MG tablet Take 5 mg by mouth at bedtime.   Yes [provider]  escitalopram (LEXAPRO) 10 MG tablet Take 10 mg by mouth at bedtime.   Yes [provider]  hydrochlorothiazide (HYDRODIURIL) 25 MG tablet Take 25 mg by mouth daily.   Yes [provider]  loperamide (IMODIUM) 2 MG capsule Take 2 mg by mouth every 6 (six) hours as needed for diarrhea or loose stools.   Yes [provider]  Melatonin 5 MG TABS Take 1 tablet by mouth at bedtime.   Yes [provider]  methimazole (TAPAZOLE) 10 MG tablet Take 10 mg by mouth daily.    Yes [provider]  Multiple Vitamins-Minerals (PRESERVISION AREDS 2) CAPS Take 1 capsule by mouth 2 (two)  times daily.   Yes [provider]  ondansetron (ZOFRAN ODT) 4 MG disintegrating tablet Take 1 tablet (4 mg total) by mouth every 8 (eight) hours as needed for nausea or vomiting. 06/30/16  Yes Alfred Levins, [place], MD  potassium chloride SA (K-DUR,KLOR-CON) 20 MEQ tablet Take 20 mEq by mouth daily.    Yes [provider]      VITAL SIGNS:  Blood pressure (!) 170/64, pulse 62, temperature 98.3 F (36.8 C), temperature source Oral, resp. rate (!) 25, SpO2 92 %.  PHYSICAL EXAMINATION:  Physical Exam  GENERAL:  83 y.o.-year-old patient lying in bed in no acute distress.   EYES: Pupils equal, round, reactive to light and accommodation. No scleral icterus. Extraocular muscles intact.  HEENT: Head atraumatic, normocephalic. Oropharynx and nasopharynx clear. No oropharyngeal erythema, moist oral mucosa  NECK:  Supple, no jugular venous distention. No thyroid enlargement, no tenderness.  LUNGS: Normal breath sounds bilaterally, no wheezing, rales, rhonchi. No use of accessory muscles of respiration.  CARDIOVASCULAR: S1, S2 RRR. No murmurs, rubs, gallops, clicks.  ABDOMEN: Soft, nontender, nondistended. Bowel sounds present. No organomegaly or mass.  EXTREMITIES: No pedal edema, cyanosis, or clubbing. + 2 pedal & radial pulses b/l.   NEUROLOGIC: Cranial nerves II through XII are intact. No focal Motor or sensory deficits appreciated b/l PSYCHIATRIC: The patient is alert and oriented x 1.   SKIN: No obvious rash, lesion, or ulcer.   LABORATORY PANEL:   CBC  Recent Labs Lab 07/02/16 1649  WBC 9.7  HGB 12.4  HCT 36.4  PLT 263   ------------------------------------------------------------------------------------------------------------------  Chemistries   Recent Labs Lab 06/30/16 0943  07/02/16 1649  NA 140  < > 137  K 3.1*  < > 3.9  CL 100*  < > 100*  CO2 31  < > 28  GLUCOSE 153*  < > 107*  BUN 23*  < > 14  CREATININE 1.21*  < > 0.86  CALCIUM 9.2  < > 9.2  MG 2.0  --   --   AST 28  --   --   ALT 12*  --   --   ALKPHOS 82  --   --   BILITOT 0.5  --   --   < > = values in this interval not displayed. ------------------------------------------------------------------------------------------------------------------  Cardiac Enzymes  Recent Labs Lab 07/02/16 1649  TROPONINI <0.03   ------------------------------------------------------------------------------------------------------------------  RADIOLOGY:  Ct Head Wo Contrast  Result Date: 07/02/2016 CLINICAL DATA:  Initial evaluation for acute trauma, fall EXAM: CT HEAD WITHOUT  CONTRAST CT CERVICAL SPINE WITHOUT CONTRAST TECHNIQUE: Multidetector CT imaging of the head and cervical spine was performed following the standard protocol without intravenous contrast. Multiplanar CT image reconstructions of the cervical spine were also generated. COMPARISON:  None. FINDINGS: CT HEAD FINDINGS Brain: Moderate generalized cerebral atrophy with chronic small vessel ischemic disease. Few scattered parenchymal calcifications noted within the right cerebellar hemisphere. No acute intracranial hemorrhage. There is focal hypodensity within the parasagittal left occipital lobe, suspicious for acute/ subacute left PCA territory infarct (series 2, image 14). No associated mass effect or evidence for frank hemorrhagic transformation. No other evidence for acute large vessel ischemia. No mass lesion, midline shift, or mass effect. Diffuse ventricular prominence most likely related to global parenchymal volume loss without hydrocephalus, similar to previous. No extra-axial fluid collection. Vascular: No asymmetric hyperdense vessel. Prominent vascular calcifications noted within the carotid siphons and distal vertebral arteries. Skull: Scalp soft tissues demonstrate no acute abnormality. Calvarium intact. Sinuses/Orbits:  Globes and orbital soft tissues within normal limits. Patient status post lens extraction bilaterally. Paranasal sinuses are clear. No mastoid effusion. CT CERVICAL SPINE FINDINGS Alignment: Trace anterolisthesis of C4 on C5 and C7 on T1. Vertebral bodies otherwise normally aligned with preservation of the normal cervical lordosis. Skull base and vertebrae: Skullbase intact. Normal C1-2 articulations preserved. Dens intact. Minimal height loss at the superior endplate of T2 is chronic in appearance. Vertebral body heights are otherwise maintained. No acute fracture. Soft tissues and spinal canal: Visualized soft tissues of the neck demonstrate no acute abnormality. Vascular calcifications about  the carotid bifurcations. Thyroid enlarged and heterogeneous in appearance, likely related to goiter. No prevertebral edema. Disc levels: Moderate degenerate spondylolysis present at C5-6 and C6-7. Osteoarthritic changes about the C1-2 articulation. Upper chest: Visualized upper chest demonstrates no acute abnormality. Focal parenchymal ground-glass opacity within the visualize right upper lobe, again concerning for neoplasm, better evaluated on recent CT. Visualized lungs are otherwise clear. IMPRESSION: CT BRAIN: 1. Focal hypodensity within the parasagittal left occipital lobe, suspicious for acute/subacute left PCA territory ischemic infarct. 2. No other acute intracranial process identified. 3. Moderate cerebral atrophy with chronic microvascular ischemic disease. CT CERVICAL SPINE: 1. No acute traumatic injury within cervical spine. 2. Moderate degenerative spondylolysis at C5-6 and C6-7. 3. Ground-glass opacity within the visualized right upper lobe, again concerning for possible neoplasm. This is better evaluated on recent CT from 06/30/2013. Electronically Signed   By: Jeannine Boga M.D.   On: 07/02/2016 17:37   Ct Cervical Spine Wo Contrast  Result Date: 07/02/2016 CLINICAL DATA:  Initial evaluation for acute trauma, fall EXAM: CT HEAD WITHOUT CONTRAST CT CERVICAL SPINE WITHOUT CONTRAST TECHNIQUE: Multidetector CT imaging of the head and cervical spine was performed following the standard protocol without intravenous contrast. Multiplanar CT image reconstructions of the cervical spine were also generated. COMPARISON:  None. FINDINGS: CT HEAD FINDINGS Brain: Moderate generalized cerebral atrophy with chronic small vessel ischemic disease. Few scattered parenchymal calcifications noted within the right cerebellar hemisphere. No acute intracranial hemorrhage. There is focal hypodensity within the parasagittal left occipital lobe, suspicious for acute/ subacute left PCA territory infarct (series 2,  image 14). No associated mass effect or evidence for frank hemorrhagic transformation. No other evidence for acute large vessel ischemia. No mass lesion, midline shift, or mass effect. Diffuse ventricular prominence most likely related to global parenchymal volume loss without hydrocephalus, similar to previous. No extra-axial fluid collection. Vascular: No asymmetric hyperdense vessel. Prominent vascular calcifications noted within the carotid siphons and distal vertebral arteries. Skull: Scalp soft tissues demonstrate no acute abnormality. Calvarium intact. Sinuses/Orbits: Globes and orbital soft tissues within normal limits. Patient status post lens extraction bilaterally. Paranasal sinuses are clear. No mastoid effusion. CT CERVICAL SPINE FINDINGS Alignment: Trace anterolisthesis of C4 on C5 and C7 on T1. Vertebral bodies otherwise normally aligned with preservation of the normal cervical lordosis. Skull base and vertebrae: Skullbase intact. Normal C1-2 articulations preserved. Dens intact. Minimal height loss at the superior endplate of T2 is chronic in appearance. Vertebral body heights are otherwise maintained. No acute fracture. Soft tissues and spinal canal: Visualized soft tissues of the neck demonstrate no acute abnormality. Vascular calcifications about the carotid bifurcations. Thyroid enlarged and heterogeneous in appearance, likely related to goiter. No prevertebral edema. Disc levels: Moderate degenerate spondylolysis present at C5-6 and C6-7. Osteoarthritic changes about the C1-2 articulation. Upper chest: Visualized upper chest demonstrates no acute abnormality. Focal parenchymal ground-glass opacity within the visualize right upper lobe, again concerning  for neoplasm, better evaluated on recent CT. Visualized lungs are otherwise clear. IMPRESSION: CT BRAIN: 1. Focal hypodensity within the parasagittal left occipital lobe, suspicious for acute/subacute left PCA territory ischemic infarct. 2. No  other acute intracranial process identified. 3. Moderate cerebral atrophy with chronic microvascular ischemic disease. CT CERVICAL SPINE: 1. No acute traumatic injury within cervical spine. 2. Moderate degenerative spondylolysis at C5-6 and C6-7. 3. Ground-glass opacity within the visualized right upper lobe, again concerning for possible neoplasm. This is better evaluated on recent CT from 06/30/2013. Electronically Signed   By: Jeannine Boga M.D.   On: 07/02/2016 17:37     IMPRESSION AND PLAN:   83 year old female with past medical history of hypertension, hyperlipidemia, osteoporosis, macular degeneration, dementia who presents to the hospital after a fall and with CT head findings suggestive of an acute/subacute CVA.  1. Acute CVA-this was noted incidentally on CT head on admission. -We'll get MRI of the brain, carotid duplex, echocardiogram. Start the patient on baby aspirin, get a neurology consult. -Check a lipid profile.  2. Suspected lung cancer-this was noted on the CT chest which was done 2 days ago. -Patient has remote history of tobacco abuse. She is supposed to follow up with [name] as an outpatient in the next few days.  3. Essential hypertension-continue Norvasc, hydrochlorothiazide.  4. Dementia-continue Aricept.  5. Depression/Anxiety-continue Lexapro, Xanax PRN.  Discussed plan of care with the patient's son over the phone. Also discussed CODE STATUS and patient is a DO NOT RESUSCITATE.  All the records are reviewed and case discussed with ED provider. Management plans discussed with the patient, family and they are in agreement.  CODE STATUS: DO NOT RESUSCITATE  TOTAL TIME TAKING CARE OF THIS PATIENT: 40 minutes.    Henreitta Leber M.D on 07/02/2016 at 6:51 PM  Between 7am to 6pm - Pager - [phone]  After 6pm go to [email or web address] - password [email or web address]  [facility]  Office  [phone]  CC: Primary care physician; System,  Provider Not In

## 2016-07-02 NOTE — Progress Notes (Signed)
  Oncology Nurse Navigator Documentation  Navigator Location: [facility] Onc (07/02/16 1100) Referral date to RadOnc/MedOnc: 07/01/16 (07/02/16 1100) )Navigator Encounter Type: Introductory phone call (07/02/16 1100)   Abnormal Finding Date: 06/30/16 (07/02/16 1100)                     Barriers/Navigation Needs: Family concerns (07/02/16 1100)                Acuity: Level 2 (07/02/16 1100)   Acuity Level 2: Initial guidance, education and coordination as needed;Educational needs (07/02/16 1100)    Spoke with pt's son regarding appt with [name] on Friday 5/25 at 8:30am. Pt's son stated would like to meet with [name] to discuss CT findings but has declined to pursue any type of treatment for his mother due to her condition at this time. Contact information given and informed to call me with any further questions.    Time Spent with Patient: 30 (07/02/16 1100)

## 2016-07-03 ENCOUNTER — Inpatient Hospital Stay: Payer: Medicare HMO

## 2016-07-03 ENCOUNTER — Inpatient Hospital Stay (HOSPITAL_COMMUNITY)
Admit: 2016-07-03 | Discharge: 2016-07-03 | Disposition: A | Discharge status: Home / Self Care | Payer: Medicare HMO | Attending: Specialist | Admitting: Specialist

## 2016-07-03 DIAGNOSIS — I340 Nonrheumatic mitral (valve) insufficiency: Secondary | ICD-10-CM

## 2016-07-03 DIAGNOSIS — I639 Cerebral infarction, unspecified: Secondary | ICD-10-CM

## 2016-07-03 LAB — BASIC METABOLIC PANEL
Anion gap: 8 (ref 5–15)
BUN: 10 mg/dL (ref 6–20)
CHLORIDE: 101 mmol/L (ref 101–111)
CO2: 28 mmol/L (ref 22–32)
CREATININE: 0.66 mg/dL (ref 0.44–1.00)
Calcium: 8.4 mg/dL — ABNORMAL LOW (ref 8.9–10.3)
GFR calc non Af Amer: >60 mL/min (ref >60)
GLUCOSE: 108 mg/dL — AB (ref 65–99)
Potassium: 3.2 mmol/L — ABNORMAL LOW (ref 3.5–5.1)
Sodium: 137 mmol/L (ref 135–145)

## 2016-07-03 LAB — CBC
HCT: 33.5 % — ABNORMAL LOW (ref 35.0–47.0)
Hemoglobin: 11.5 g/dL — ABNORMAL LOW (ref 12.0–16.0)
MCH: 31.1 pg (ref 26.0–34.0)
MCHC: 34.2 g/dL (ref 32.0–36.0)
MCV: 91.1 fL (ref 80.0–100.0)
PLATELETS: 243 10*3/uL (ref 150–440)
RBC: 3.68 MIL/uL — AB (ref 3.80–5.20)
RDW: 14.2 % (ref 11.5–14.5)
WBC: 9.2 10*3/uL (ref 3.6–11.0)

## 2016-07-03 LAB — MRSA PCR SCREENING: MRSA by PCR: NEGATIVE

## 2016-07-03 LAB — LIPID PANEL
CHOL/HDL RATIO: 5.1 ratio
CHOLESTEROL: 185 mg/dL (ref 0–200)
HDL: 36 mg/dL — ABNORMAL LOW (ref >40)
LDL Cholesterol: 119 mg/dL — ABNORMAL HIGH (ref 0–99)
TRIGLYCERIDES: 151 mg/dL — AB (ref <150)
VLDL: 30 mg/dL (ref 0–40)

## 2016-07-03 LAB — ECHOCARDIOGRAM COMPLETE
Height: 64 in
Weight: 2432 oz

## 2016-07-03 MED ORDER — POTASSIUM CHLORIDE CRYS ER 20 MEQ PO TBCR
40.0000 meq | EXTENDED_RELEASE_TABLET | Freq: Once | ORAL | Status: AC
  Administered 2016-07-03: 10:00:00 40 meq via ORAL
  Filled 2016-07-03: qty 2

## 2016-07-03 MED ORDER — ROSUVASTATIN CALCIUM 10 MG PO TABS
10.0000 mg | ORAL_TABLET | Freq: Every day | ORAL | Status: DC
  Administered 2016-07-03: 10 mg via ORAL
  Filled 2016-07-03: qty 1

## 2016-07-03 NOTE — Progress Notes (Signed)
[facility] [place] NAME: Sarah [name]    MR#:  [id number]  DATE OF BIRTH:  05/10/1924  SUBJECTIVE:   Patient with dementia  REVIEW OF SYSTEMS:    Unable to obtain review of systems this patient was dementia   Tolerating Diet: yes      DRUG ALLERGIES:  No Known Allergies  VITALS:  Blood pressure (!) 146/71, pulse 66, temperature 98.6 F (37 C), temperature source Oral, resp. rate 16, height '5\' 4"'$  (1.626 m), weight 68.9 kg (152 lb), SpO2 94 %.  PHYSICAL EXAMINATION:  Constitutional: Appears well-developed and well-nourished. No distress. HENT: Normocephalic. Marland Kitchen Oropharynx is clear and moist.  Eyes: Conjunctivae and EOM are normal. PERRLA, no scleral icterus.  Neck: Normal ROM. Neck supple. No JVD. No tracheal deviation. CVS: RRR, S1/S2 +, no murmurs, no gallops, no carotid bruit.  Pulmonary: Effort and breath sounds normal, no stridor, rhonchi, wheezes, rales.  Abdominal: Soft. BS +,  no distension, tenderness, rebound or guarding.  Musculoskeletal: Normal range of motion. No edema and no tenderness.  Neuro: Alert. CN 2-12 grossly intact. No focal deficits. She may have some decreased sensation on left face Oriented to name place or time Skin: Skin is warm and dry. No rash noted. Psychiatric: Normal mood and affect.      LABORATORY PANEL:   CBC  Recent Labs Lab 07/03/16 0608  WBC 9.2  HGB 11.5*  HCT 33.5*  PLT 243   ------------------------------------------------------------------------------------------------------------------  Chemistries   Recent Labs Lab 06/30/16 0943  07/03/16 0608  NA 140  < > 137  K 3.1*  < > 3.2*  CL 100*  < > 101  CO2 31  < > 28  GLUCOSE 153*  < > 108*  BUN 23*  < > 10  CREATININE 1.21*  < > 0.66  CALCIUM 9.2  < > 8.4*  MG 2.0  --   --   AST 28  --   --   ALT 12*  --   --   ALKPHOS 82  --   --   BILITOT 0.5  --   --   < > = values in this interval not  displayed. ------------------------------------------------------------------------------------------------------------------  Cardiac Enzymes  Recent Labs Lab 06/30/16 0943 07/02/16 1649  TROPONINI <0.03 <0.03   ------------------------------------------------------------------------------------------------------------------  RADIOLOGY:  Ct Head Wo Contrast  Result Date: 07/02/2016 CLINICAL DATA:  Initial evaluation for acute trauma, fall EXAM: CT HEAD WITHOUT CONTRAST CT CERVICAL SPINE WITHOUT CONTRAST TECHNIQUE: Multidetector CT imaging of the head and cervical spine was performed following the standard protocol without intravenous contrast. Multiplanar CT image reconstructions of the cervical spine were also generated. COMPARISON:  None. FINDINGS: CT HEAD FINDINGS Brain: Moderate generalized cerebral atrophy with chronic small vessel ischemic disease. Few scattered parenchymal calcifications noted within the right cerebellar hemisphere. No acute intracranial hemorrhage. There is focal hypodensity within the parasagittal left occipital lobe, suspicious for acute/ subacute left PCA territory infarct (series 2, image 14). No associated mass effect or evidence for frank hemorrhagic transformation. No other evidence for acute large vessel ischemia. No mass lesion, midline shift, or mass effect. Diffuse ventricular prominence most likely related to global parenchymal volume loss without hydrocephalus, similar to previous. No extra-axial fluid collection. Vascular: No asymmetric hyperdense vessel. Prominent vascular calcifications noted within the carotid siphons and distal vertebral arteries. Skull: Scalp soft tissues demonstrate no acute abnormality. Calvarium intact. Sinuses/Orbits: Globes and orbital soft tissues within normal limits. Patient status post lens extraction  bilaterally. Paranasal sinuses are clear. No mastoid effusion. CT CERVICAL SPINE FINDINGS Alignment: Trace anterolisthesis of C4 on  C5 and C7 on T1. Vertebral bodies otherwise normally aligned with preservation of the normal cervical lordosis. Skull base and vertebrae: Skullbase intact. Normal C1-2 articulations preserved. Dens intact. Minimal height loss at the superior endplate of T2 is chronic in appearance. Vertebral body heights are otherwise maintained. No acute fracture. Soft tissues and spinal canal: Visualized soft tissues of the neck demonstrate no acute abnormality. Vascular calcifications about the carotid bifurcations. Thyroid enlarged and heterogeneous in appearance, likely related to goiter. No prevertebral edema. Disc levels: Moderate degenerate spondylolysis present at C5-6 and C6-7. Osteoarthritic changes about the C1-2 articulation. Upper chest: Visualized upper chest demonstrates no acute abnormality. Focal parenchymal ground-glass opacity within the visualize right upper lobe, again concerning for neoplasm, better evaluated on recent CT. Visualized lungs are otherwise clear. IMPRESSION: CT BRAIN: 1. Focal hypodensity within the parasagittal left occipital lobe, suspicious for acute/subacute left PCA territory ischemic infarct. 2. No other acute intracranial process identified. 3. Moderate cerebral atrophy with chronic microvascular ischemic disease. CT CERVICAL SPINE: 1. No acute traumatic injury within cervical spine. 2. Moderate degenerative spondylolysis at C5-6 and C6-7. 3. Ground-glass opacity within the visualized right upper lobe, again concerning for possible neoplasm. This is better evaluated on recent CT from 06/30/2013. Electronically Signed   By: Jeannine Boga M.D.   On: 07/02/2016 17:37   Ct Cervical Spine Wo Contrast  Result Date: 07/02/2016 CLINICAL DATA:  Initial evaluation for acute trauma, fall EXAM: CT HEAD WITHOUT CONTRAST CT CERVICAL SPINE WITHOUT CONTRAST TECHNIQUE: Multidetector CT imaging of the head and cervical spine was performed following the standard protocol without intravenous  contrast. Multiplanar CT image reconstructions of the cervical spine were also generated. COMPARISON:  None. FINDINGS: CT HEAD FINDINGS Brain: Moderate generalized cerebral atrophy with chronic small vessel ischemic disease. Few scattered parenchymal calcifications noted within the right cerebellar hemisphere. No acute intracranial hemorrhage. There is focal hypodensity within the parasagittal left occipital lobe, suspicious for acute/ subacute left PCA territory infarct (series 2, image 14). No associated mass effect or evidence for frank hemorrhagic transformation. No other evidence for acute large vessel ischemia. No mass lesion, midline shift, or mass effect. Diffuse ventricular prominence most likely related to global parenchymal volume loss without hydrocephalus, similar to previous. No extra-axial fluid collection. Vascular: No asymmetric hyperdense vessel. Prominent vascular calcifications noted within the carotid siphons and distal vertebral arteries. Skull: Scalp soft tissues demonstrate no acute abnormality. Calvarium intact. Sinuses/Orbits: Globes and orbital soft tissues within normal limits. Patient status post lens extraction bilaterally. Paranasal sinuses are clear. No mastoid effusion. CT CERVICAL SPINE FINDINGS Alignment: Trace anterolisthesis of C4 on C5 and C7 on T1. Vertebral bodies otherwise normally aligned with preservation of the normal cervical lordosis. Skull base and vertebrae: Skullbase intact. Normal C1-2 articulations preserved. Dens intact. Minimal height loss at the superior endplate of T2 is chronic in appearance. Vertebral body heights are otherwise maintained. No acute fracture. Soft tissues and spinal canal: Visualized soft tissues of the neck demonstrate no acute abnormality. Vascular calcifications about the carotid bifurcations. Thyroid enlarged and heterogeneous in appearance, likely related to goiter. No prevertebral edema. Disc levels: Moderate degenerate spondylolysis  present at C5-6 and C6-7. Osteoarthritic changes about the C1-2 articulation. Upper chest: Visualized upper chest demonstrates no acute abnormality. Focal parenchymal ground-glass opacity within the visualize right upper lobe, again concerning for neoplasm, better evaluated on recent CT. Visualized lungs are otherwise clear.  IMPRESSION: CT BRAIN: 1. Focal hypodensity within the parasagittal left occipital lobe, suspicious for acute/subacute left PCA territory ischemic infarct. 2. No other acute intracranial process identified. 3. Moderate cerebral atrophy with chronic microvascular ischemic disease. CT CERVICAL SPINE: 1. No acute traumatic injury within cervical spine. 2. Moderate degenerative spondylolysis at C5-6 and C6-7. 3. Ground-glass opacity within the visualized right upper lobe, again concerning for possible neoplasm. This is better evaluated on recent CT from 06/30/2013. Electronically Signed   By: Jeannine Boga M.D.   On: 07/02/2016 17:37     ASSESSMENT AND PLAN:   83 year old female with history of dementia and hypertension who presents from assisted living facility with fall and incidentally found to have acute/subacute CVA on CT head.  1. Focal hypodensity within the parasagittal left occipital lobe, suspicious for acute/subacute left PCA territory ischemic infarct:  Follow-up on CVA workup Follow-up on PT evaluation and neurology consultation Continue aspirin and Crestor LDL 119   2. Hypokalemia: Will replete  3. Dementia: Continue Aricept  4. Essential hypertension: Continue Norvasc and HCTZ  5. Hyperthyroidism: Continue Tapazole  Need PT for dispo   Management plans discussed with nursing staff  CODE STATUS: DO NOT RESUSCITATE   TOTAL TIME TAKING CARE OF THIS PATIENT: 29 minutes   POSSIBLE D/C tomorrow , DEPENDING ON CLINICAL CONDITION.   Abbygayle Helfand M.D on 07/03/2016 at 10:34 AM  Between 7am to 6pm - Pager - [phone] After 6pm go to [email or web address] -  password [email or web address] [facility]  Office  [phone]  CC: Primary care physician; System, Provider Not In  Note: This dictation was prepared with Dragon dictation along with smaller phrase technology. Any transcriptional errors that result from this process are unintentional.

## 2016-07-03 NOTE — Consult Note (Addendum)
Referring Physician: Mody     Chief Complaint: Falls  HPI: Sarah [name] is an 83 y.o. female with a history of dementia who is unable to provide any history due to his dementia.  No family available at this time.  She had a fall 2 hours ago and was initially placed back in bed but was sent to the ED due to complaining of head pain and dizziness.  Initial NIHSS of 4.   Date last known well: Unable to determine Time last known well: Unable to determine tPA Given: No: Unknown LKW  Past Medical History:  Diagnosis Date  . Dementia   . Fall   . Hyperlipidemia   . Hypertension   . Macular degeneration   . Osteoporosis     History reviewed. No pertinent surgical history.  Family History  Problem Relation Age of Onset  . CVA Mother   . Dementia Father    Social History:  reports that she has quit smoking. She has never used smokeless tobacco. She reports that she does not drink alcohol or use drugs.  Allergies: No Known Allergies  Medications:  I have reviewed the patient's current medications. Prior to Admission:  Prescriptions Prior to Admission  Medication Sig Dispense Refill Last Dose  . acetaminophen (TYLENOL) 325 MG tablet Take 650 mg by mouth every 4 (four) hours as needed.   PRN at PRN  . acetaminophen (TYLENOL) 500 MG tablet Take 500 mg by mouth 2 (two) times daily.   07/02/2016 at 0800  . ALPRAZolam (XANAX) 0.25 MG tablet Take 0.25 mg by mouth 3 (three) times daily as needed for anxiety.   PRN at PRN  . amLODipine (NORVASC) 10 MG tablet Take 10 mg by mouth daily.    07/02/2016 at 0800  . Cholecalciferol (VITAMIN D-1000 MAX ST) 1000 units tablet Take 1 tablet by mouth daily.   07/02/2016 at 0800  . Cranberry 450 MG CAPS Take by mouth.   07/02/2016 at 0800  . donepezil (ARICEPT) 5 MG tablet Take 5 mg by mouth at bedtime.   07/01/2016 at 2000  . escitalopram (LEXAPRO) 10 MG tablet Take 10 mg by mouth at bedtime.   07/02/2016 at 2000  . hydrochlorothiazide (HYDRODIURIL) 25 MG  tablet Take 25 mg by mouth daily.   07/02/2016 at 0800  . loperamide (IMODIUM) 2 MG capsule Take 2 mg by mouth every 6 (six) hours as needed for diarrhea or loose stools.   PRN at PRN  . Melatonin 5 MG TABS Take 1 tablet by mouth at bedtime.   07/01/2016 at 2000  . methimazole (TAPAZOLE) 10 MG tablet Take 10 mg by mouth daily.    07/02/2016 at 0800  . Multiple Vitamins-Minerals (PRESERVISION AREDS 2) CAPS Take 1 capsule by mouth 2 (two) times daily.   07/02/2016 at 0800  . ondansetron (ZOFRAN ODT) 4 MG disintegrating tablet Take 1 tablet (4 mg total) by mouth every 8 (eight) hours as needed for nausea or vomiting. 20 tablet 0 PRN at PRN  . potassium chloride SA (K-DUR,KLOR-CON) 20 MEQ tablet Take 20 mEq by mouth daily.    07/02/2016 at 0800   Scheduled: . amLODipine  10 mg Oral Daily  . aspirin EC  81 mg Oral Daily  . cholecalciferol  1,000 Units Oral Daily  . donepezil  5 mg Oral QHS  . enoxaparin (LOVENOX) injection  40 mg Subcutaneous Q24H  . escitalopram  10 mg Oral QHS  . hydrochlorothiazide  25 mg Oral Daily  .  Melatonin  1 tablet Oral QHS  . methimazole  10 mg Oral Daily  . multivitamin-lutein  1 capsule Oral BID  . potassium chloride SA  20 mEq Oral Daily  . rosuvastatin  10 mg Oral q1800    ROS: Unable to provide due to dementia  Physical Examination: Blood pressure (!) 146/71, pulse 66, temperature 98.6 F (37 C), temperature source Oral, resp. rate 16, height '5\' 4"'$  (1.626 m), weight 68.9 kg (152 lb), SpO2 94 %.  GEN: NAD HEENT-  Normocephalic, no lesions, without obvious abnormality.  Normal external eye and conjunctiva.  Normal TM's bilaterally.  Normal auditory canals and external ears. Normal external nose, mucus membranes and septum.  Normal pharynx. Cardiovascular- S1, S2 normal, pulses palpable throughout   Lungs- chest clear, no wheezing, rales, normal symmetric air entry Abdomen- soft, non-tender; bowel sounds normal; no masses,  no organomegaly Extremities- no  edema Lymph-no adenopathy palpable Musculoskeletal-no joint tenderness, deformity or swelling Skin-warm and dry, no hyperpigmentation, vitiligo, or suspicious lesions  Neurological Examination   Mental Status: Alert.  Oriented to name only.  Reports that it is 1999.  Does not know where she is.  Speech fluent without evidence of aphasia.  Follows simple commands.  Unable to perform 3-step commands without extensive reinforcement. Cranial Nerves: II: Discs flat bilaterally; RHH, pupils equal, round, reactive to light and accommodation III,IV, VI: ptosis not present, extra-ocular motions intact bilaterally V,VII: mild decrease in the right NLF, facial light touch sensation normal bilaterally VIII: hearing normal bilaterally IX,X: gag reflex present XI: bilateral shoulder shrug XII: midline tongue extension Motor: Right : Upper extremity   5/5    Left:     Upper extremity   5/5  Lower extremity   5/5     Lower extremity   5/5 Tone and bulk:normal tone throughout; no atrophy noted Sensory: Pinprick and light touch intact throughout, bilaterally Deep Tendon Reflexes: 2+ in the upper extremities, absent in the lower extremities Plantars: Right: mute   Left: mute Cerebellar: Normal finger-to-nose and normal heel-to-shin testing bilaterally Gait: not tested due to safety concerns    Laboratory Studies:  Basic Metabolic Panel:  Recent Labs Lab 06/30/16 0943 06/30/16 1351 07/02/16 1649 07/03/16 0608  NA 140 141 137 137  K 3.1* 4.8 3.9 3.2*  CL 100* 105 100* 101  CO2 '31 29 28 28  '$ GLUCOSE 153* 94 107* 108*  BUN 23* 21* 14 10  CREATININE 1.21* 0.97 0.86 0.66  CALCIUM 9.2 8.6* 9.2 8.4*  MG 2.0  --   --   --     Liver Function Tests:  Recent Labs Lab 06/30/16 0943  AST 28  ALT 12*  ALKPHOS 82  BILITOT 0.5  PROT 7.6  ALBUMIN 3.8    Recent Labs Lab 06/30/16 0943  LIPASE 36   No results for input(s): AMMONIA in the last 168 hours.  CBC:  Recent Labs Lab  06/30/16 0943 07/02/16 1649 07/03/16 0608  WBC 11.0 9.7 9.2  NEUTROABS  --  6.5  --   HGB 13.8 12.4 11.5*  HCT 40.3 36.4 33.5*  MCV 92.0 92.4 91.1  PLT 321 263 243    Cardiac Enzymes:  Recent Labs Lab 06/30/16 0943 07/02/16 1649  TROPONINI <0.03 <0.03    BNP: Invalid input(s): POCBNP  CBG: No results for input(s): GLUCAP in the last 168 hours.  Microbiology: Results for orders placed or performed during the hospital encounter of 07/02/16  MRSA PCR Screening     Status: None  Collection Time: 07/03/16 12:57 AM  Result Value Ref Range Status   MRSA by PCR NEGATIVE NEGATIVE Final    Comment:        The GeneXpert MRSA Assay (FDA approved for NASAL specimens only), is one component of a comprehensive MRSA colonization surveillance program. It is not intended to diagnose MRSA infection nor to guide or monitor treatment for MRSA infections.     Coagulation Studies: No results for input(s): LABPROT, INR in the last 72 hours.  Urinalysis:  Recent Labs Lab 06/30/16 1351 07/02/16 1649  COLORURINE YELLOW* STRAW*  LABSPEC 1.016 1.004*  PHURINE 6.0 5.0  GLUCOSEU NEGATIVE NEGATIVE  HGBUR NEGATIVE NEGATIVE  BILIRUBINUR NEGATIVE NEGATIVE  KETONESUR NEGATIVE NEGATIVE  PROTEINUR NEGATIVE NEGATIVE  NITRITE NEGATIVE NEGATIVE  LEUKOCYTESUR NEGATIVE NEGATIVE    Lipid Panel:    Component Value Date/Time   CHOL 185 07/03/2016 0608   TRIG 151 (H) 07/03/2016 0608   HDL 36 (L) 07/03/2016 0608   CHOLHDL 5.1 07/03/2016 0608   VLDL 30 07/03/2016 0608   LDLCALC 119 (H) 07/03/2016 0608    HgbA1C: No results found for: HGBA1C  Urine Drug Screen:  No results found for: LABOPIA, COCAINSCRNUR, LABBENZ, AMPHETMU, THCU, LABBARB  Alcohol Level: No results for input(s): ETH in the last 168 hours.  Other results: EKG: sinus rhythm at 67 bpm.  Imaging: Dg Abd 1 View  Result Date: 07/03/2016 CLINICAL DATA:  Abdominal pain EXAM: ABDOMEN - 1 VIEW COMPARISON:  None.  FINDINGS: Scattered large and small bowel gas is noted. No obstructive changes are seen. Postsurgical changes in the proximal left femur are noted. No acute bony abnormality is seen. IMPRESSION: No acute abnormality noted. Electronically Signed   By: Inez Catalina M.D.   On: 07/03/2016 11:04   Ct Head Wo Contrast  Result Date: 07/02/2016 CLINICAL DATA:  Initial evaluation for acute trauma, fall EXAM: CT HEAD WITHOUT CONTRAST CT CERVICAL SPINE WITHOUT CONTRAST TECHNIQUE: Multidetector CT imaging of the head and cervical spine was performed following the standard protocol without intravenous contrast. Multiplanar CT image reconstructions of the cervical spine were also generated. COMPARISON:  None. FINDINGS: CT HEAD FINDINGS Brain: Moderate generalized cerebral atrophy with chronic small vessel ischemic disease. Few scattered parenchymal calcifications noted within the right cerebellar hemisphere. No acute intracranial hemorrhage. There is focal hypodensity within the parasagittal left occipital lobe, suspicious for acute/ subacute left PCA territory infarct (series 2, image 14). No associated mass effect or evidence for frank hemorrhagic transformation. No other evidence for acute large vessel ischemia. No mass lesion, midline shift, or mass effect. Diffuse ventricular prominence most likely related to global parenchymal volume loss without hydrocephalus, similar to previous. No extra-axial fluid collection. Vascular: No asymmetric hyperdense vessel. Prominent vascular calcifications noted within the carotid siphons and distal vertebral arteries. Skull: Scalp soft tissues demonstrate no acute abnormality. Calvarium intact. Sinuses/Orbits: Globes and orbital soft tissues within normal limits. Patient status post lens extraction bilaterally. Paranasal sinuses are clear. No mastoid effusion. CT CERVICAL SPINE FINDINGS Alignment: Trace anterolisthesis of C4 on C5 and C7 on T1. Vertebral bodies otherwise normally  aligned with preservation of the normal cervical lordosis. Skull base and vertebrae: Skullbase intact. Normal C1-2 articulations preserved. Dens intact. Minimal height loss at the superior endplate of T2 is chronic in appearance. Vertebral body heights are otherwise maintained. No acute fracture. Soft tissues and spinal canal: Visualized soft tissues of the neck demonstrate no acute abnormality. Vascular calcifications about the carotid bifurcations. Thyroid enlarged and heterogeneous in appearance, likely  related to goiter. No prevertebral edema. Disc levels: Moderate degenerate spondylolysis present at C5-6 and C6-7. Osteoarthritic changes about the C1-2 articulation. Upper chest: Visualized upper chest demonstrates no acute abnormality. Focal parenchymal ground-glass opacity within the visualize right upper lobe, again concerning for neoplasm, better evaluated on recent CT. Visualized lungs are otherwise clear. IMPRESSION: CT BRAIN: 1. Focal hypodensity within the parasagittal left occipital lobe, suspicious for acute/subacute left PCA territory ischemic infarct. 2. No other acute intracranial process identified. 3. Moderate cerebral atrophy with chronic microvascular ischemic disease. CT CERVICAL SPINE: 1. No acute traumatic injury within cervical spine. 2. Moderate degenerative spondylolysis at C5-6 and C6-7. 3. Ground-glass opacity within the visualized right upper lobe, again concerning for possible neoplasm. This is better evaluated on recent CT from 06/30/2013. Electronically Signed   By: Jeannine Boga M.D.   On: 07/02/2016 17:37   Ct Cervical Spine Wo Contrast  Result Date: 07/02/2016 CLINICAL DATA:  Initial evaluation for acute trauma, fall EXAM: CT HEAD WITHOUT CONTRAST CT CERVICAL SPINE WITHOUT CONTRAST TECHNIQUE: Multidetector CT imaging of the head and cervical spine was performed following the standard protocol without intravenous contrast. Multiplanar CT image reconstructions of the  cervical spine were also generated. COMPARISON:  None. FINDINGS: CT HEAD FINDINGS Brain: Moderate generalized cerebral atrophy with chronic small vessel ischemic disease. Few scattered parenchymal calcifications noted within the right cerebellar hemisphere. No acute intracranial hemorrhage. There is focal hypodensity within the parasagittal left occipital lobe, suspicious for acute/ subacute left PCA territory infarct (series 2, image 14). No associated mass effect or evidence for frank hemorrhagic transformation. No other evidence for acute large vessel ischemia. No mass lesion, midline shift, or mass effect. Diffuse ventricular prominence most likely related to global parenchymal volume loss without hydrocephalus, similar to previous. No extra-axial fluid collection. Vascular: No asymmetric hyperdense vessel. Prominent vascular calcifications noted within the carotid siphons and distal vertebral arteries. Skull: Scalp soft tissues demonstrate no acute abnormality. Calvarium intact. Sinuses/Orbits: Globes and orbital soft tissues within normal limits. Patient status post lens extraction bilaterally. Paranasal sinuses are clear. No mastoid effusion. CT CERVICAL SPINE FINDINGS Alignment: Trace anterolisthesis of C4 on C5 and C7 on T1. Vertebral bodies otherwise normally aligned with preservation of the normal cervical lordosis. Skull base and vertebrae: Skullbase intact. Normal C1-2 articulations preserved. Dens intact. Minimal height loss at the superior endplate of T2 is chronic in appearance. Vertebral body heights are otherwise maintained. No acute fracture. Soft tissues and spinal canal: Visualized soft tissues of the neck demonstrate no acute abnormality. Vascular calcifications about the carotid bifurcations. Thyroid enlarged and heterogeneous in appearance, likely related to goiter. No prevertebral edema. Disc levels: Moderate degenerate spondylolysis present at C5-6 and C6-7. Osteoarthritic changes about the  C1-2 articulation. Upper chest: Visualized upper chest demonstrates no acute abnormality. Focal parenchymal ground-glass opacity within the visualize right upper lobe, again concerning for neoplasm, better evaluated on recent CT. Visualized lungs are otherwise clear. IMPRESSION: CT BRAIN: 1. Focal hypodensity within the parasagittal left occipital lobe, suspicious for acute/subacute left PCA territory ischemic infarct. 2. No other acute intracranial process identified. 3. Moderate cerebral atrophy with chronic microvascular ischemic disease. CT CERVICAL SPINE: 1. No acute traumatic injury within cervical spine. 2. Moderate degenerative spondylolysis at C5-6 and C6-7. 3. Ground-glass opacity within the visualized right upper lobe, again concerning for possible neoplasm. This is better evaluated on recent CT from 06/30/2013. Electronically Signed   By: Jeannine Boga M.D.   On: 07/02/2016 17:37    Assessment: 83 y.o.  female presenting after a fall.  Head CT reviewed and shows a hypodensity in the left occipital lobe, suggestive of an acute/subacute infarct.  Patient on no antiplatelet therapy.  Echo and carotid dopplers are pending.  LDL 119.  Further work up recommended.    Stroke Risk Factors - hyperlipidemia and hypertension  Plan: 1. HgbA1c 2. MRI, MRA  of the brain without contrast 3. PT consult, OT consult, Speech consult 4. Statin for lipid management with target LDL<70. 5. Telemetry monitoring 6. Prophylactic therapy-Antiplatelet med: Aspirin - dose '325mg'$  daily 7. NPO until RN stroke swallow screen 8. Frequent neuro checks   Alexis Goodell, MD Neurology [phone] 07/03/2016, 11:16 AM

## 2016-07-03 NOTE — Progress Notes (Signed)
*  PRELIMINARY RESULTS* Echocardiogram 2D Echocardiogram has been performed.  Sherrie Sport 07/03/2016, 1:49 PM

## 2016-07-03 NOTE — Plan of Care (Signed)
Problem: Education: Goal: Knowledge of [facility] Education information/materials will improve Outcome: Progressing Pt likes to be called Sarah [name]  Past Medical History:  Diagnosis Date  . Dementia   . Fall   . Hyperlipidemia   . Hypertension   . Macular degeneration   . Osteoporosis    Pt is well controlled with home medications

## 2016-07-03 NOTE — Evaluation (Signed)
Physical Therapy Evaluation Patient Details Name: Sarah [name] MRN: [id number] DOB: 04/24/1924 Today's Date: 07/03/2016   History of Present Illness   83 yo female with onset of fall with pain on head, sent to ED.  Imaging was not abel to confirm a new stroke, subacute to old stroke only.  PMHx: macular degeneration, osteoporosis, falls, HLD, HTN, dementia  Clinical Impression  Pt is in subacute stroke, to previous old stroke, with residual LLE symptoms that make her unsafe to stand alone.  Worked on mobility bedside as she is a high fall risk and nursing asked PT to get her back to bed after session.  WIll follow her acutely to progress gait and strength exercises as she tolerates, and work toward her return to ALF with PT ordered.  If ALF cannot assist her safely may need to change plan to SNF.    Follow Up Recommendations Home health PT;Supervision/Assistance - 24 hour    Equipment Recommendations  Rolling walker with 5" wheels (if current walker is not in good repair)    Recommendations for Other Services       Precautions / Restrictions Precautions Precautions: Fall (telemetry) Restrictions Weight Bearing Restrictions: No      Mobility  Bed Mobility Overal bed mobility: Needs Assistance Bed Mobility: Supine to Sit;Sit to Supine     Supine to sit: Min assist Sit to supine: Min assist   General bed mobility comments: cued use of rails and hand placement  Transfers Overall transfer level: Needs assistance Equipment used: Rolling walker (2 wheeled);1 person hand held assist Transfers: Sit to/from Stand Sit to Stand: Mod assist         General transfer comment: cued hand placement  Ambulation/Gait Ambulation/Gait assistance: Mod assist;Min assist Ambulation Distance (Feet): 12 Feet (in three trips) Assistive device: Rolling walker (2 wheeled);1 person hand held assist Gait Pattern/deviations: Step-to pattern;Decreased stride length;Trunk flexed;Wide base of  support Gait velocity: reduced Gait velocity interpretation: Below normal speed for age/gender General Gait Details: sidesteps side of bed  Stairs            Wheelchair Mobility    Modified Rankin (Stroke Patients Only)       Balance Overall balance assessment: Needs assistance Sitting-balance support: Feet supported Sitting balance-Leahy Scale: Fair   Postural control: Posterior lean Standing balance support: Bilateral upper extremity supported Standing balance-Leahy Scale: Poor                               Pertinent Vitals/Pain Pain Assessment: No/denies pain    Home Living Family/patient expects to be discharged to:: Assisted living               Home Equipment: Walker - 2 wheels Additional Comments: pt is poor historian    Prior Function Level of Independence: Needs assistance   Gait / Transfers Assistance Needed: per pt used RW  ADL's / Homemaking Assistance Needed: in ALF and has assistance for all household work and meals        Hand Dominance        Extremity/Trunk Assessment   Upper Extremity Assessment Upper Extremity Assessment: Overall WFL for tasks assessed    Lower Extremity Assessment Lower Extremity Assessment: Generalized weakness    Cervical / Trunk Assessment Cervical / Trunk Assessment: Kyphotic  Communication   Communication: No difficulties  Cognition Arousal/Alertness: Awake/alert Behavior During Therapy: Impulsive Overall Cognitive Status: History of cognitive impairments - at baseline  General Comments General comments (skin integrity, edema, etc.): Pt has weakness to stand on LLE but does not follow standard mm testing commands    Exercises     Assessment/Plan    PT Assessment Patient needs continued PT services  PT Problem List Decreased strength;Decreased range of motion;Decreased activity tolerance;Decreased balance;Decreased  mobility;Decreased coordination;Decreased cognition;Decreased safety awareness;Decreased knowledge of use of DME;Cardiopulmonary status limiting activity       PT Treatment Interventions DME instruction;Gait training;Functional mobility training;Therapeutic activities;Therapeutic exercise;Balance training;Neuromuscular re-education;Patient/family education    PT Goals (Current goals can be found in the Care Plan section)  Acute Rehab PT Goals Patient Stated Goal: none stated PT Goal Formulation: Patient unable to participate in goal setting Time For Goal Achievement: 07/17/16 Potential to Achieve Goals: Good    Frequency Min 2X/week   Barriers to discharge   has ALF support    Co-evaluation               AM-PAC PT "6 Clicks" Daily Activity  Outcome Measure Difficulty turning over in bed (including adjusting bedclothes, sheets and blankets)?: Total Difficulty moving from lying on back to sitting on the side of the bed? : Total Difficulty sitting down on and standing up from a chair with arms (e.g., wheelchair, bedside commode, etc,.)?: Total Help needed moving to and from a bed to chair (including a wheelchair)?: A Lot Help needed walking in hospital room?: A Lot Help needed climbing 3-5 steps with a railing? : Total 6 Click Score: 8    End of Session Equipment Utilized During Treatment: Gait belt;Oxygen Activity Tolerance: Patient limited by fatigue Patient left: in bed;with call bell/phone within reach;with bed alarm set Nurse Communication: Mobility status PT Visit Diagnosis: Unsteadiness on feet (R26.81);Muscle weakness (generalized) (M62.81)    Time: 3428-7681 PT Time Calculation (min) (ACUTE ONLY): 35 min   Charges:   PT Evaluation $PT Eval Moderate Complexity: 1 Procedure PT Treatments $Gait Training: 8-22 mins   PT G Codes:        Ramond Dial 07-26-2016, 4:02 PM   Mee Hives, PT MS Acute Rehab Dept. Number: [place] and [place]

## 2016-07-04 DIAGNOSIS — G933 Postviral fatigue syndrome: Secondary | ICD-10-CM | POA: Diagnosis not present

## 2016-07-04 MED ORDER — ASPIRIN 81 MG PO TBEC: 81.0000 mg | DELAYED_RELEASE_TABLET | Freq: Every day | ORAL | 0 refills | Status: AC

## 2016-07-04 NOTE — Care Management Important Message (Signed)
Important Message  Patient Details  Name: RANELLE AUKER MRN: [id number] Date of Birth: 1924/09/18   Medicare Important Message Given:  N/A - LOS <3 / Initial given by admissions    Beverly Sessions, RN 07/04/2016, 11:29 AM

## 2016-07-04 NOTE — Progress Notes (Signed)
Pt was discharged today, all IV's removed, pt was dressed in her clothes from home. All other belongings packed and returned to her. Discharge instructions reviewed with her and her facilitator. She was rolled out in wheelchair by staff.

## 2016-07-04 NOTE — Clinical Social Work Note (Signed)
Clinical Social Work Assessment  Patient Details  Name: Sarah [name] MRN: [id number] Date of Birth: Oct 25, 1924  Date of referral:  07/04/16               Reason for consult:  Facility Placement, Discharge Planning                Permission sought to share information with:  Family Supports Permission granted to share information::  Yes, Verbal Permission Granted  Name::     [name]  Relationship::  son  Contact Information:  [phone]  Housing/Transportation Living arrangements for the past 2 months:  [place] of Information:  Adult Children Patient Interpreter Needed:  None Criminal Activity/Legal Involvement Pertinent to Current Situation/Hospitalization:  No - Comment as needed Significant Relationships:  Adult Children Lives with:  Facility Resident Do you feel safe going back to the place where you live?  Yes Need for family participation in patient care:  Yes (Comment)  Care giving concerns:  No care giving concerns identified.   Social Worker assessment / plan:  Pt is ready for discharge today. Pt was admitted from [facility] and will return. CSW spoke with pt's son, [name], as pt is oriented to self only due to dementia. CSW introduced herself and explained role of social work. CSW also explained process of returning to facility. Pt's is in agreement of discharge plan.   CSW spoke with facility, and they are able to accept pt back. Facility will provide transportation. RNCM will arrange home health. RN is aware as well. CSW is signing off as no further needs identified.   Employment status:  Retired Nurse, adult PT Recommendations:  Home with [place], [place] / Referral to community resources:  Other (Comment Required) (ALF and HH)  Patient/Family's Response to care:  Pt's son was appreciative of CSW support.   Patient/Family's Understanding of and Emotional Response to Diagnosis,  Current Treatment, and Prognosis:  Pt's son understands that pt will return to facility.   Emotional Assessment Appearance:  Appears stated age Attitude/Demeanor/Rapport:  Other Affect (typically observed):  Other Orientation:  Oriented to Self Alcohol / Substance use:  Not Applicable Psych involvement (Current and /or in the community):  No (Comment)  Discharge Needs  Concerns to be addressed:  Adjustment to Illness Readmission within the last 30 days:  Yes Current discharge risk:  Chronically ill Barriers to Discharge:  No Barriers Identified   Darden Dates, LCSW 07/04/2016, 11:13 AM

## 2016-07-04 NOTE — NC FL2 (Signed)
[place] LEVEL OF CARE SCREENING TOOL     IDENTIFICATION  Patient Name: Sarah [name] Birthdate: 10-12-1924 Sex: female Admission Date (Current Location): 07/02/2016  [place] and [place] Number:  Engineering geologist and Address:  [facility], [address] [place], [place] [address]      Provider Number: [id number]  Attending Physician Name and Address:  Bettey Costa, MD  Relative Name and Phone Number:       Current Level of Care: Hospital Recommended Level of Care: [place] Prior Approval Number:    Date Approved/Denied:   PASRR Number:    Discharge Plan: Domiciliary (Rest home)    Current Diagnoses: Patient Active Problem List   Diagnosis Date Noted  . CVA (cerebral vascular accident) ([place]) 07/02/2016    Orientation RESPIRATION BLADDER Height & Weight     Self  Normal Incontinent Weight: 152 lb (68.9 kg) Height:  '5\' 4"'$  (162.6 cm)  BEHAVIORAL SYMPTOMS/MOOD NEUROLOGICAL BOWEL NUTRITION STATUS      Continent Diet (Regular Diet)  AMBULATORY STATUS COMMUNICATION OF NEEDS Skin   Limited Assist Verbally Normal                       Personal Care Assistance Level of Assistance  Bathing, Feeding, Dressing Bathing Assistance: Limited assistance Feeding assistance: Independent Dressing Assistance: Limited assistance     Functional Limitations Info  Hearing, Sight, Speech Sight Info: Adequate Hearing Info: Adequate Speech Info: Adequate    SPECIAL CARE FACTORS FREQUENCY  PT (By licensed PT)     PT Frequency: Home Health Services              Contractures Contractures Info: Not present    Additional Factors Info  Code Status, Allergies, Psychotropic Code Status Info: DNR Allergies Info: No known allergies Psychotropic Info: Medications:  Lexapro         Discharge Medications: Please see discharge summary for a list of discharge medications. Current Discharge Medication List        START taking these medications   Details  aspirin EC 81 MG EC tablet Take 1 tablet (81 mg total) by mouth daily. Qty: 30 tablet, Refills: 0         CONTINUE these medications which have NOT CHANGED   Details  !! acetaminophen (TYLENOL) 325 MG tablet Take 650 mg by mouth every 4 (four) hours as needed.    !! acetaminophen (TYLENOL) 500 MG tablet Take 500 mg by mouth 2 (two) times daily.    ALPRAZolam (XANAX) 0.25 MG tablet Take 0.25 mg by mouth 3 (three) times daily as needed for anxiety.    amLODipine (NORVASC) 10 MG tablet Take 10 mg by mouth daily.     Cholecalciferol (VITAMIN D-1000 MAX ST) 1000 units tablet Take 1 tablet by mouth daily.    Cranberry 450 MG CAPS Take by mouth.    donepezil (ARICEPT) 5 MG tablet Take 5 mg by mouth at bedtime.    escitalopram (LEXAPRO) 10 MG tablet Take 10 mg by mouth at bedtime.    hydrochlorothiazide (HYDRODIURIL) 25 MG tablet Take 25 mg by mouth daily.    loperamide (IMODIUM) 2 MG capsule Take 2 mg by mouth every 6 (six) hours as needed for diarrhea or loose stools.    Melatonin 5 MG TABS Take 1 tablet by mouth at bedtime.    methimazole (TAPAZOLE) 10 MG tablet Take 10 mg by mouth daily.     Multiple Vitamins-Minerals (PRESERVISION  AREDS 2) CAPS Take 1 capsule by mouth 2 (two) times daily.    ondansetron (ZOFRAN ODT) 4 MG disintegrating tablet Take 1 tablet (4 mg total) by mouth every 8 (eight) hours as needed for nausea or vomiting. Qty: 20 tablet, Refills: 0    potassium chloride SA (K-DUR,KLOR-CON) 20 MEQ tablet Take 20 mEq by mouth daily.      !! - Potential duplicate medications found. Please discuss with provider.       Relevant Imaging Results:  Relevant Lab Results:   Additional Information SSN:  [id number]  Darden Dates, LCSW

## 2016-07-04 NOTE — Discharge Summary (Signed)
[facility] [place] NAME: Sarah [name]    MR#:  [id number]  DATE OF BIRTH:  [birth year]  DATE OF ADMISSION:  07/02/2016 ADMITTING PHYSICIAN: Henreitta Leber, MD  DATE OF DISCHARGE: 07/04/2016  PRIMARY CARE PHYSICIAN: Cecille Amsterdam    ADMISSION DIAGNOSIS:  Cerebrovascular accident (CVA), unspecified mechanism ([place]) [I63.9]  DISCHARGE DIAGNOSIS:  Dementia with fall Remote CVA Hypokalemia   SECONDARY DIAGNOSIS:   Past Medical History:  Diagnosis Date  . Dementia   . Fall   . Hyperlipidemia   . Hypertension   . Macular degeneration   . Osteoporosis     HOSPITAL COURSE:  83 year old female with history of dementia and hypertension who presents from assisted living facility with fall and incidentally found to have acute/subacute CVA on CT head.  1. Old CVA: CT scan on admission showed possible subacute/acute CVA. MRI was performed which did not show acute CVA. She has an old left occipital lobe infarct. She was evaluated by neurology. She underwent carotid ultrasound showed no hemodynamically significant stenosis.  Echocardiogram shows preserved ejection fraction with mild diastolic dysfunction. She is discharged on aspirin. Due to her age statin is not advisable due to side effects.  2. Hypokalemia: This was repleted.  3. Dementia: Continue Aricept  4. Essential hypertension: Continue Norvasc and HCTZ  5. Hyperthyroidism: Continue Tapazole   DISCHARGE CONDITIONS AND DIET:   Stable for discharge on regular diet  CONSULTS OBTAINED:  Treatment Team:  Alexis Goodell, MD  DRUG ALLERGIES:  No Known Allergies  DISCHARGE MEDICATIONS:   Current Discharge Medication List    START taking these medications   Details  aspirin EC 81 MG EC tablet Take 1 tablet (81 mg total) by mouth daily. Qty: 30 tablet, Refills: 0      CONTINUE these medications which have NOT CHANGED   Details  !! acetaminophen (TYLENOL) 325  MG tablet Take 650 mg by mouth every 4 (four) hours as needed.    !! acetaminophen (TYLENOL) 500 MG tablet Take 500 mg by mouth 2 (two) times daily.    ALPRAZolam (XANAX) 0.25 MG tablet Take 0.25 mg by mouth 3 (three) times daily as needed for anxiety.    amLODipine (NORVASC) 10 MG tablet Take 10 mg by mouth daily.     Cholecalciferol (VITAMIN D-1000 MAX ST) 1000 units tablet Take 1 tablet by mouth daily.    Cranberry 450 MG CAPS Take by mouth.    donepezil (ARICEPT) 5 MG tablet Take 5 mg by mouth at bedtime.    escitalopram (LEXAPRO) 10 MG tablet Take 10 mg by mouth at bedtime.    hydrochlorothiazide (HYDRODIURIL) 25 MG tablet Take 25 mg by mouth daily.    loperamide (IMODIUM) 2 MG capsule Take 2 mg by mouth every 6 (six) hours as needed for diarrhea or loose stools.    Melatonin 5 MG TABS Take 1 tablet by mouth at bedtime.    methimazole (TAPAZOLE) 10 MG tablet Take 10 mg by mouth daily.     Multiple Vitamins-Minerals (PRESERVISION AREDS 2) CAPS Take 1 capsule by mouth 2 (two) times daily.    ondansetron (ZOFRAN ODT) 4 MG disintegrating tablet Take 1 tablet (4 mg total) by mouth every 8 (eight) hours as needed for nausea or vomiting. Qty: 20 tablet, Refills: 0    potassium chloride SA (K-DUR,KLOR-CON) 20 MEQ tablet Take 20 mEq by mouth daily.      !! - Potential duplicate medications found. Please discuss with provider.  Today   CHIEF COMPLAINT:   No acute events overnight. Patient has dementia.  VITAL SIGNS:  Blood pressure (!) 159/99, pulse 63, temperature 98.3 F (36.8 C), resp. rate 20, height '5\' 4"'$  (1.626 m), weight 68.9 kg (152 lb), SpO2 96 %.   REVIEW OF SYSTEMS:  Review of Systems  Unable to perform ROS: Dementia     PHYSICAL EXAMINATION:  GENERAL:  84 y.o.-year-old patient lying in the bed with no acute distress.  NECK:  Supple, no jugular venous distention. No thyroid enlargement, no tenderness.  LUNGS: Normal breath sounds bilaterally, no  wheezing, rales,rhonchi  No use of accessory muscles of respiration.  CARDIOVASCULAR: S1, S2 normal. No murmurs, rubs, or gallops.  ABDOMEN: Soft, non-tender, non-distended. Bowel sounds present. No organomegaly or mass.  EXTREMITIES: No pedal edema, cyanosis, or clubbing.  PSYCHIATRIC: The patient is alert And oriented to name only   SKIN: No obvious rash, lesion, or ulcer.   DATA REVIEW:   CBC  Recent Labs Lab 07/03/16 0608  WBC 9.2  HGB 11.5*  HCT 33.5*  PLT 243    Chemistries   Recent Labs Lab 06/30/16 0943  07/03/16 0608  NA 140  < > 137  K 3.1*  < > 3.2*  CL 100*  < > 101  CO2 31  < > 28  GLUCOSE 153*  < > 108*  BUN 23*  < > 10  CREATININE 1.21*  < > 0.66  CALCIUM 9.2  < > 8.4*  MG 2.0  --   --   AST 28  --   --   ALT 12*  --   --   ALKPHOS 82  --   --   BILITOT 0.5  --   --   < > = values in this interval not displayed.  Cardiac Enzymes  Recent Labs Lab 06/30/16 0943 07/02/16 1649  TROPONINI <0.03 <0.03    Microbiology Results  '@[id number]'$ @  RADIOLOGY:  Dg Abd 1 View  Result Date: 07/03/2016 CLINICAL DATA:  Abdominal pain EXAM: ABDOMEN - 1 VIEW COMPARISON:  None. FINDINGS: Scattered large and small bowel gas is noted. No obstructive changes are seen. Postsurgical changes in the proximal left femur are noted. No acute bony abnormality is seen. IMPRESSION: No acute abnormality noted. Electronically Signed   By: Inez Catalina M.D.   On: 07/03/2016 11:04   Ct Head Wo Contrast  Result Date: 07/02/2016 CLINICAL DATA:  Initial evaluation for acute trauma, fall EXAM: CT HEAD WITHOUT CONTRAST CT CERVICAL SPINE WITHOUT CONTRAST TECHNIQUE: Multidetector CT imaging of the head and cervical spine was performed following the standard protocol without intravenous contrast. Multiplanar CT image reconstructions of the cervical spine were also generated. COMPARISON:  None. FINDINGS: CT HEAD FINDINGS Brain: Moderate generalized cerebral atrophy with chronic small  vessel ischemic disease. Few scattered parenchymal calcifications noted within the right cerebellar hemisphere. No acute intracranial hemorrhage. There is focal hypodensity within the parasagittal left occipital lobe, suspicious for acute/ subacute left PCA territory infarct (series 2, image 14). No associated mass effect or evidence for frank hemorrhagic transformation. No other evidence for acute large vessel ischemia. No mass lesion, midline shift, or mass effect. Diffuse ventricular prominence most likely related to global parenchymal volume loss without hydrocephalus, similar to previous. No extra-axial fluid collection. Vascular: No asymmetric hyperdense vessel. Prominent vascular calcifications noted within the carotid siphons and distal vertebral arteries. Skull: Scalp soft tissues demonstrate no acute abnormality. Calvarium intact. Sinuses/Orbits: Globes and orbital soft tissues within normal  limits. Patient status post lens extraction bilaterally. Paranasal sinuses are clear. No mastoid effusion. CT CERVICAL SPINE FINDINGS Alignment: Trace anterolisthesis of C4 on C5 and C7 on T1. Vertebral bodies otherwise normally aligned with preservation of the normal cervical lordosis. Skull base and vertebrae: Skullbase intact. Normal C1-2 articulations preserved. Dens intact. Minimal height loss at the superior endplate of T2 is chronic in appearance. Vertebral body heights are otherwise maintained. No acute fracture. Soft tissues and spinal canal: Visualized soft tissues of the neck demonstrate no acute abnormality. Vascular calcifications about the carotid bifurcations. Thyroid enlarged and heterogeneous in appearance, likely related to goiter. No prevertebral edema. Disc levels: Moderate degenerate spondylolysis present at C5-6 and C6-7. Osteoarthritic changes about the C1-2 articulation. Upper chest: Visualized upper chest demonstrates no acute abnormality. Focal parenchymal ground-glass opacity within the  visualize right upper lobe, again concerning for neoplasm, better evaluated on recent CT. Visualized lungs are otherwise clear. IMPRESSION: CT BRAIN: 1. Focal hypodensity within the parasagittal left occipital lobe, suspicious for acute/subacute left PCA territory ischemic infarct. 2. No other acute intracranial process identified. 3. Moderate cerebral atrophy with chronic microvascular ischemic disease. CT CERVICAL SPINE: 1. No acute traumatic injury within cervical spine. 2. Moderate degenerative spondylolysis at C5-6 and C6-7. 3. Ground-glass opacity within the visualized right upper lobe, again concerning for possible neoplasm. This is better evaluated on recent CT from 06/30/2013. Electronically Signed   By: Jeannine Boga M.D.   On: 07/02/2016 17:37   Ct Cervical Spine Wo Contrast  Result Date: 07/02/2016 CLINICAL DATA:  Initial evaluation for acute trauma, fall EXAM: CT HEAD WITHOUT CONTRAST CT CERVICAL SPINE WITHOUT CONTRAST TECHNIQUE: Multidetector CT imaging of the head and cervical spine was performed following the standard protocol without intravenous contrast. Multiplanar CT image reconstructions of the cervical spine were also generated. COMPARISON:  None. FINDINGS: CT HEAD FINDINGS Brain: Moderate generalized cerebral atrophy with chronic small vessel ischemic disease. Few scattered parenchymal calcifications noted within the right cerebellar hemisphere. No acute intracranial hemorrhage. There is focal hypodensity within the parasagittal left occipital lobe, suspicious for acute/ subacute left PCA territory infarct (series 2, image 14). No associated mass effect or evidence for frank hemorrhagic transformation. No other evidence for acute large vessel ischemia. No mass lesion, midline shift, or mass effect. Diffuse ventricular prominence most likely related to global parenchymal volume loss without hydrocephalus, similar to previous. No extra-axial fluid collection. Vascular: No asymmetric  hyperdense vessel. Prominent vascular calcifications noted within the carotid siphons and distal vertebral arteries. Skull: Scalp soft tissues demonstrate no acute abnormality. Calvarium intact. Sinuses/Orbits: Globes and orbital soft tissues within normal limits. Patient status post lens extraction bilaterally. Paranasal sinuses are clear. No mastoid effusion. CT CERVICAL SPINE FINDINGS Alignment: Trace anterolisthesis of C4 on C5 and C7 on T1. Vertebral bodies otherwise normally aligned with preservation of the normal cervical lordosis. Skull base and vertebrae: Skullbase intact. Normal C1-2 articulations preserved. Dens intact. Minimal height loss at the superior endplate of T2 is chronic in appearance. Vertebral body heights are otherwise maintained. No acute fracture. Soft tissues and spinal canal: Visualized soft tissues of the neck demonstrate no acute abnormality. Vascular calcifications about the carotid bifurcations. Thyroid enlarged and heterogeneous in appearance, likely related to goiter. No prevertebral edema. Disc levels: Moderate degenerate spondylolysis present at C5-6 and C6-7. Osteoarthritic changes about the C1-2 articulation. Upper chest: Visualized upper chest demonstrates no acute abnormality. Focal parenchymal ground-glass opacity within the visualize right upper lobe, again concerning for neoplasm, better evaluated on recent CT.  Visualized lungs are otherwise clear. IMPRESSION: CT BRAIN: 1. Focal hypodensity within the parasagittal left occipital lobe, suspicious for acute/subacute left PCA territory ischemic infarct. 2. No other acute intracranial process identified. 3. Moderate cerebral atrophy with chronic microvascular ischemic disease. CT CERVICAL SPINE: 1. No acute traumatic injury within cervical spine. 2. Moderate degenerative spondylolysis at C5-6 and C6-7. 3. Ground-glass opacity within the visualized right upper lobe, again concerning for possible neoplasm. This is better  evaluated on recent CT from 06/30/2013. Electronically Signed   By: Jeannine Boga M.D.   On: 07/02/2016 17:37   Mr Brain Wo Contrast  Result Date: 07/03/2016 CLINICAL DATA:  Cerebral infarction EXAM: MRI HEAD WITHOUT CONTRAST TECHNIQUE: Multiplanar, multiecho pulse sequences of the brain and surrounding structures were obtained without intravenous contrast. COMPARISON:  Head CT from yesterday FINDINGS: Brain: The left occipital infarct seen on prior head CT has features of late subacute to chronic timing. Cortical laminar necrosis findings are present. No acute infarct, acute hemorrhage, obstructive hydrocephalus, or mass. There is advanced chronic microvascular ischemic change with confluent FLAIR hyperintensity in the cerebral white matter. Volume loss with ventriculomegaly. No narrowing of the callosal angle or sulcal crowding to suggest communicating hydrocephalus. Small remote bilateral cerebellar infarcts. Vascular: Major flow voids are preserved Skull and upper cervical spine: Negative Sinuses/Orbits: Negative IMPRESSION: 1. No acute finding. 2. Remote left occipital infarct. 3. Generalized atrophy that has progressed from 2007. 4. Confluent chronic microvascular ischemic change in the cerebral white matter. Electronically Signed   By: Monte Fantasia M.D.   On: 07/03/2016 12:32   US Carotid Bilateral  Result Date: 07/03/2016 CLINICAL DATA:  CVA. EXAM: BILATERAL CAROTID DUPLEX ULTRASOUND TECHNIQUE: Pearline Cables scale imaging, color Doppler and duplex ultrasound were performed of bilateral carotid and vertebral arteries in the neck. COMPARISON:  MRI 07/03/2016. FINDINGS: Criteria: Quantification of carotid stenosis is based on velocity parameters that correlate the residual internal carotid diameter with NASCET-based stenosis levels, using the diameter of the distal internal carotid lumen as the denominator for stenosis measurement. The following velocity measurements were obtained: RIGHT ICA:  84/16  cm/sec CCA:  26/8 cm/sec SYSTOLIC ICA/CCA RATIO:  1.0 DIASTOLIC ICA/CCA RATIO:  1.7 ECA:  126 cm/sec LEFT ICA:  97/16 cm/sec CCA:  34/19 cm/sec SYSTOLIC ICA/CCA RATIO:  1.0 DIASTOLIC ICA/CCA RATIO:  1.2 ECA:  163 cm/sec RIGHT CAROTID ARTERY: Mild right carotid bifurcation atherosclerotic vascular plaque. No flow limiting stenosis. RIGHT VERTEBRAL ARTERY:  Patent with antegrade flow. LEFT CAROTID ARTERY: Mild left carotid bifurcation atherosclerotic vascular plaque. No flow limiting stenosis. LEFT VERTEBRAL ARTERY:  Patent with antegrade flow. IMPRESSION: 1. Mild bilateral carotid bifurcation atherosclerotic vascular plaque. No flow limiting stenosis. Degree of stenosis less than 50% bilaterally. 2. Vertebrals are patent with antegrade flow. Electronically Signed   By: Marcello Moores  Register   On: 07/03/2016 13:12      Current Discharge Medication List    START taking these medications   Details  aspirin EC 81 MG EC tablet Take 1 tablet (81 mg total) by mouth daily. Qty: 30 tablet, Refills: 0      CONTINUE these medications which have NOT CHANGED   Details  !! acetaminophen (TYLENOL) 325 MG tablet Take 650 mg by mouth every 4 (four) hours as needed.    !! acetaminophen (TYLENOL) 500 MG tablet Take 500 mg by mouth 2 (two) times daily.    ALPRAZolam (XANAX) 0.25 MG tablet Take 0.25 mg by mouth 3 (three) times daily as needed for anxiety.  amLODipine (NORVASC) 10 MG tablet Take 10 mg by mouth daily.     Cholecalciferol (VITAMIN D-1000 MAX ST) 1000 units tablet Take 1 tablet by mouth daily.    Cranberry 450 MG CAPS Take by mouth.    donepezil (ARICEPT) 5 MG tablet Take 5 mg by mouth at bedtime.    escitalopram (LEXAPRO) 10 MG tablet Take 10 mg by mouth at bedtime.    hydrochlorothiazide (HYDRODIURIL) 25 MG tablet Take 25 mg by mouth daily.    loperamide (IMODIUM) 2 MG capsule Take 2 mg by mouth every 6 (six) hours as needed for diarrhea or loose stools.    Melatonin 5 MG TABS Take 1 tablet  by mouth at bedtime.    methimazole (TAPAZOLE) 10 MG tablet Take 10 mg by mouth daily.     Multiple Vitamins-Minerals (PRESERVISION AREDS 2) CAPS Take 1 capsule by mouth 2 (two) times daily.    ondansetron (ZOFRAN ODT) 4 MG disintegrating tablet Take 1 tablet (4 mg total) by mouth every 8 (eight) hours as needed for nausea or vomiting. Qty: 20 tablet, Refills: 0    potassium chloride SA (K-DUR,KLOR-CON) 20 MEQ tablet Take 20 mEq by mouth daily.      !! - Potential duplicate medications found. Please discuss with provider.          Management plans discussed with the patient's son and he is in agreement. Stable for discharge with [place]  Patient should follow up with pcp  CODE STATUS:     Code Status Orders        Start     Ordered   07/02/16 2101  Do not attempt resuscitation (DNR)  Continuous    Question Answer Comment  In the event of cardiac or respiratory ARREST Do not call a "code blue"   In the event of cardiac or respiratory ARREST Do not perform Intubation, CPR, defibrillation or ACLS   In the event of cardiac or respiratory ARREST Use medication by any route, position, wound care, and other measures to relive pain and suffering. May use oxygen, suction and manual treatment of airway obstruction as needed for comfort.      07/02/16 2100    Code Status History    Date Active Date Inactive Code Status Order ID Comments User Context   This patient has a current code status but no historical code status.      TOTAL TIME TAKING CARE OF THIS PATIENT: 37 minutes.    Note: This dictation was prepared with Dragon dictation along with smaller phrase technology. Any transcriptional errors that result from this process are unintentional.  Lael Pilch M.D on 07/04/2016 at 10:40 AM  Between 7am to 6pm - Pager - [phone] After 6pm go to [email or web address] - password [email or web address] [facility]  Office  [phone]  CC: Primary care physician; System,  Provider Not In

## 2016-07-04 NOTE — Care Management (Signed)
Patient admitted with possible subacute/acute CVA.  Patient from [facility].  Patient with history of Dementia.  PT has assessed patient and recommended home health PT.  Home health orders have been placed for RN, PT and social work.  Discharge disposition discussed with patient's son Asako Saliba.  Home health agency preference provided.  Son states that he does not have a preference. Referral made to [facility] with [facility].  RNCM signing off.

## 2016-07-05 DIAGNOSIS — G933 Postviral fatigue syndrome: Secondary | ICD-10-CM | POA: Diagnosis not present

## 2016-07-06 DIAGNOSIS — F419 Anxiety disorder, unspecified: Secondary | ICD-10-CM | POA: Diagnosis not present

## 2016-07-06 DIAGNOSIS — H548 Legal blindness, as defined in USA: Secondary | ICD-10-CM | POA: Diagnosis not present

## 2016-07-06 DIAGNOSIS — H3530 Unspecified macular degeneration: Secondary | ICD-10-CM | POA: Diagnosis not present

## 2016-07-06 DIAGNOSIS — R296 Repeated falls: Secondary | ICD-10-CM | POA: Diagnosis not present

## 2016-07-06 DIAGNOSIS — I10 Essential (primary) hypertension: Secondary | ICD-10-CM | POA: Diagnosis not present

## 2016-07-06 DIAGNOSIS — E785 Hyperlipidemia, unspecified: Secondary | ICD-10-CM | POA: Diagnosis not present

## 2016-07-06 DIAGNOSIS — F0390 Unspecified dementia without behavioral disturbance: Secondary | ICD-10-CM | POA: Diagnosis not present

## 2016-07-06 DIAGNOSIS — R2689 Other abnormalities of gait and mobility: Secondary | ICD-10-CM | POA: Diagnosis not present

## 2016-07-06 DIAGNOSIS — G933 Postviral fatigue syndrome: Secondary | ICD-10-CM | POA: Diagnosis not present

## 2016-07-06 DIAGNOSIS — F329 Major depressive disorder, single episode, unspecified: Secondary | ICD-10-CM | POA: Diagnosis not present

## 2016-07-07 DIAGNOSIS — G933 Postviral fatigue syndrome: Secondary | ICD-10-CM | POA: Diagnosis not present

## 2016-07-08 DIAGNOSIS — G933 Postviral fatigue syndrome: Secondary | ICD-10-CM | POA: Diagnosis not present

## 2016-07-09 DIAGNOSIS — R2689 Other abnormalities of gait and mobility: Secondary | ICD-10-CM | POA: Diagnosis not present

## 2016-07-09 DIAGNOSIS — R296 Repeated falls: Secondary | ICD-10-CM | POA: Diagnosis not present

## 2016-07-09 DIAGNOSIS — I10 Essential (primary) hypertension: Secondary | ICD-10-CM | POA: Diagnosis not present

## 2016-07-09 DIAGNOSIS — G933 Postviral fatigue syndrome: Secondary | ICD-10-CM | POA: Diagnosis not present

## 2016-07-09 DIAGNOSIS — G309 Alzheimer's disease, unspecified: Secondary | ICD-10-CM | POA: Diagnosis not present

## 2016-07-09 DIAGNOSIS — F419 Anxiety disorder, unspecified: Secondary | ICD-10-CM | POA: Diagnosis not present

## 2016-07-09 DIAGNOSIS — H3530 Unspecified macular degeneration: Secondary | ICD-10-CM | POA: Diagnosis not present

## 2016-07-09 DIAGNOSIS — H548 Legal blindness, as defined in USA: Secondary | ICD-10-CM | POA: Diagnosis not present

## 2016-07-09 DIAGNOSIS — E785 Hyperlipidemia, unspecified: Secondary | ICD-10-CM | POA: Diagnosis not present

## 2016-07-09 DIAGNOSIS — F0390 Unspecified dementia without behavioral disturbance: Secondary | ICD-10-CM | POA: Diagnosis not present

## 2016-07-09 DIAGNOSIS — F329 Major depressive disorder, single episode, unspecified: Secondary | ICD-10-CM | POA: Diagnosis not present

## 2016-07-10 DIAGNOSIS — H548 Legal blindness, as defined in USA: Secondary | ICD-10-CM | POA: Diagnosis not present

## 2016-07-10 DIAGNOSIS — G933 Postviral fatigue syndrome: Secondary | ICD-10-CM | POA: Diagnosis not present

## 2016-07-10 DIAGNOSIS — F329 Major depressive disorder, single episode, unspecified: Secondary | ICD-10-CM | POA: Diagnosis not present

## 2016-07-10 DIAGNOSIS — R296 Repeated falls: Secondary | ICD-10-CM | POA: Diagnosis not present

## 2016-07-10 DIAGNOSIS — H3530 Unspecified macular degeneration: Secondary | ICD-10-CM | POA: Diagnosis not present

## 2016-07-10 DIAGNOSIS — R2689 Other abnormalities of gait and mobility: Secondary | ICD-10-CM | POA: Diagnosis not present

## 2016-07-10 DIAGNOSIS — I10 Essential (primary) hypertension: Secondary | ICD-10-CM | POA: Diagnosis not present

## 2016-07-10 DIAGNOSIS — E785 Hyperlipidemia, unspecified: Secondary | ICD-10-CM | POA: Diagnosis not present

## 2016-07-10 DIAGNOSIS — F419 Anxiety disorder, unspecified: Secondary | ICD-10-CM | POA: Diagnosis not present

## 2016-07-10 DIAGNOSIS — F0390 Unspecified dementia without behavioral disturbance: Secondary | ICD-10-CM | POA: Diagnosis not present

## 2016-07-11 ENCOUNTER — Encounter: Payer: Self-pay | Admitting: *Deleted

## 2016-07-11 ENCOUNTER — Telehealth: Payer: Self-pay | Admitting: *Deleted

## 2016-07-11 ENCOUNTER — Emergency Department
Admission: EM | Admit: 2016-07-11 | Discharge: 2016-07-11 | Disposition: A | Discharge status: Home / Self Care | Payer: Medicare HMO | Attending: Emergency Medicine | Admitting: Emergency Medicine

## 2016-07-11 ENCOUNTER — Encounter: Payer: Self-pay | Admitting: Hematology and Oncology

## 2016-07-11 ENCOUNTER — Inpatient Hospital Stay: Payer: Medicare HMO | Attending: Hematology and Oncology | Admitting: Hematology and Oncology

## 2016-07-11 ENCOUNTER — Emergency Department: Payer: Medicare HMO

## 2016-07-11 DIAGNOSIS — I10 Essential (primary) hypertension: Secondary | ICD-10-CM | POA: Insufficient documentation

## 2016-07-11 DIAGNOSIS — Z7982 Long term (current) use of aspirin: Secondary | ICD-10-CM | POA: Insufficient documentation

## 2016-07-11 DIAGNOSIS — E785 Hyperlipidemia, unspecified: Secondary | ICD-10-CM | POA: Diagnosis not present

## 2016-07-11 DIAGNOSIS — R7989 Other specified abnormal findings of blood chemistry: Secondary | ICD-10-CM | POA: Insufficient documentation

## 2016-07-11 DIAGNOSIS — R29818 Other symptoms and signs involving the nervous system: Secondary | ICD-10-CM | POA: Insufficient documentation

## 2016-07-11 DIAGNOSIS — R32 Unspecified urinary incontinence: Secondary | ICD-10-CM | POA: Diagnosis not present

## 2016-07-11 DIAGNOSIS — C7801 Secondary malignant neoplasm of right lung: Secondary | ICD-10-CM

## 2016-07-11 DIAGNOSIS — F0280 Dementia in other diseases classified elsewhere without behavioral disturbance: Secondary | ICD-10-CM | POA: Diagnosis not present

## 2016-07-11 DIAGNOSIS — I2510 Atherosclerotic heart disease of native coronary artery without angina pectoris: Secondary | ICD-10-CM | POA: Diagnosis not present

## 2016-07-11 DIAGNOSIS — Z87891 Personal history of nicotine dependence: Secondary | ICD-10-CM | POA: Insufficient documentation

## 2016-07-11 DIAGNOSIS — R110 Nausea: Secondary | ICD-10-CM | POA: Diagnosis not present

## 2016-07-11 DIAGNOSIS — C3411 Malignant neoplasm of upper lobe, right bronchus or lung: Secondary | ICD-10-CM

## 2016-07-11 DIAGNOSIS — F0390 Unspecified dementia without behavioral disturbance: Secondary | ICD-10-CM | POA: Diagnosis not present

## 2016-07-11 DIAGNOSIS — I639 Cerebral infarction, unspecified: Secondary | ICD-10-CM | POA: Diagnosis not present

## 2016-07-11 DIAGNOSIS — R778 Other specified abnormalities of plasma proteins: Secondary | ICD-10-CM

## 2016-07-11 DIAGNOSIS — R918 Other nonspecific abnormal finding of lung field: Secondary | ICD-10-CM

## 2016-07-11 DIAGNOSIS — Z79899 Other long term (current) drug therapy: Secondary | ICD-10-CM

## 2016-07-11 DIAGNOSIS — H3530 Unspecified macular degeneration: Secondary | ICD-10-CM | POA: Diagnosis not present

## 2016-07-11 DIAGNOSIS — R1111 Vomiting without nausea: Secondary | ICD-10-CM | POA: Diagnosis not present

## 2016-07-11 DIAGNOSIS — G933 Postviral fatigue syndrome: Secondary | ICD-10-CM | POA: Diagnosis not present

## 2016-07-11 DIAGNOSIS — R748 Abnormal levels of other serum enzymes: Secondary | ICD-10-CM | POA: Diagnosis not present

## 2016-07-11 DIAGNOSIS — R4182 Altered mental status, unspecified: Secondary | ICD-10-CM | POA: Diagnosis not present

## 2016-07-11 LAB — URINALYSIS, COMPLETE (UACMP) WITH MICROSCOPIC
Bacteria, UA: NONE SEEN
Bilirubin Urine: NEGATIVE
GLUCOSE, UA: NEGATIVE mg/dL
KETONES UR: 5 mg/dL — AB
Leukocytes, UA: NEGATIVE
Nitrite: NEGATIVE
Protein, ur: NEGATIVE mg/dL
Specific Gravity, Urine: 1.019 (ref 1.005–1.030)
pH: 5 (ref 5.0–8.0)

## 2016-07-11 LAB — CBC WITH DIFFERENTIAL/PLATELET
BASOS ABS: 0 10*3/uL (ref 0–0.1)
BASOS PCT: 0 %
EOS ABS: 0.1 10*3/uL (ref 0–0.7)
Eosinophils Relative: 1 %
HEMATOCRIT: 37.7 % (ref 35.0–47.0)
Hemoglobin: 12.9 g/dL (ref 12.0–16.0)
Lymphocytes Relative: 9 %
Lymphs Abs: 1.4 10*3/uL (ref 1.0–3.6)
MCH: 31.6 pg (ref 26.0–34.0)
MCHC: 34.3 g/dL (ref 32.0–36.0)
MCV: 92.2 fL (ref 80.0–100.0)
MONOS PCT: 7 %
Monocytes Absolute: 1 10*3/uL — ABNORMAL HIGH (ref 0.2–0.9)
NEUTROS ABS: 12.2 10*3/uL — AB (ref 1.4–6.5)
Neutrophils Relative %: 83 %
Platelets: 305 10*3/uL (ref 150–440)
RBC: 4.1 MIL/uL (ref 3.80–5.20)
RDW: 14.4 % (ref 11.5–14.5)
WBC: 14.7 10*3/uL — ABNORMAL HIGH (ref 3.6–11.0)

## 2016-07-11 LAB — TROPONIN I: TROPONIN I: 0.07 ng/mL — AB (ref <0.03)

## 2016-07-11 LAB — COMPREHENSIVE METABOLIC PANEL
ALK PHOS: 88 U/L (ref 38–126)
ALT: 14 U/L (ref 14–54)
ANION GAP: 11 (ref 5–15)
AST: 27 U/L (ref 15–41)
Albumin: 3.9 g/dL (ref 3.5–5.0)
BUN: 19 mg/dL (ref 6–20)
CALCIUM: 9.2 mg/dL (ref 8.9–10.3)
CO2: 25 mmol/L (ref 22–32)
CREATININE: 0.92 mg/dL (ref 0.44–1.00)
Chloride: 100 mmol/L — ABNORMAL LOW (ref 101–111)
GFR, EST NON AFRICAN AMERICAN: 53 mL/min — AB (ref >60)
Glucose, Bld: 97 mg/dL (ref 65–99)
Potassium: 3.8 mmol/L (ref 3.5–5.1)
SODIUM: 136 mmol/L (ref 135–145)
TOTAL PROTEIN: 7.6 g/dL (ref 6.5–8.1)
Total Bilirubin: 0.5 mg/dL (ref 0.3–1.2)

## 2016-07-11 MED ORDER — ASPIRIN EC 81 MG PO TBEC: 81.0000 mg | DELAYED_RELEASE_TABLET | Freq: Once | ORAL | Status: DC

## 2016-07-11 NOTE — ED Notes (Signed)
MD Quale notified of 0.07 troponin result

## 2016-07-11 NOTE — Progress Notes (Signed)
  Oncology Nurse Navigator Documentation  Navigator Location: [facility] (07/11/16 0900)   )Navigator Encounter Type: Clinic/MDC (07/11/16 0900)               Multidisiplinary Clinic Date: 07/11/16 (07/11/16 0900) Multidisiplinary Clinic Type: Thoracic (07/11/16 0900)   Patient Visit Type: MedOnc (07/11/16 0900) Treatment Phase: Abnormal Scans (07/11/16 0900) Barriers/Navigation Needs: Coordination of Care (07/11/16 0900)   Interventions: Coordination of Care (07/11/16 0900)   Coordination of Care: Appts (07/11/16 0900)         met with patient during initial consultation with [name] in thoracic multidisciplinary clinic. Pt's son did not accompany son during visit today. All questions answered at the time of visit. Will coordinate with pt's son to schedule return visit to discuss options.          Time Spent with Patient: 60 (07/11/16 0900)

## 2016-07-11 NOTE — Progress Notes (Signed)
[facility] day:  07/11/2016  Chief Complaint: Sarah [name] is a 83 y.o. female with a right lung mass who is referred in consultation from the emergency room.  HPI:  The patient has Alzheimer's.  She is accompanied by Mickel Baas from transportation at [employer].  History is obtained from clinic and hospital notes.  Chest CT on 06/30/2016 revealed a 3.1 x 3.7 cm sub solid ground-glass mass in the apical segment right upper lobe, likely gradually progressive on chest radiographs dating back to 03/23/2010 and worrisome for primary bronchogenic carcinoma.  There was a 1.9 cm pure ground-glass nodule in the superior segment right lower lobe and a 4 mm RLL nodule. Continued attention on followup exams is warranted as low-grade adenocarcinoma can have this appearance.   Low-attenuation lesions in the pancreas were incompletely imaged. Follow-up CT or MR abdomen without and with contrast could be performed in 2 years as clinically appropriate, as cystic pancreatic neoplasm cannot be excluded.  There was aortic atherosclerosis and three-vessel coronary artery calcification.  There was an enlarged, nodular and heterogeneous thyroid.  She was admitted to [facility] from 07/02/2016 - 07/04/2016 following a fall.  Head CT on admission showed possible subacute/acute CVA. Head MRI did not show acute CVA. She had an old left occipital lobe infarct.  She was evaluated by neurology. She underwent carotid ultrasound which showed no hemodynamically significant stenosis.  Echo revealed an EF of 60-65% with mild diastolic dysfunction.  She was discharged on aspirin. Due to her age, a statin is not advisable due to side effects.  She denies any respiratory symptoms.  She denies any pain.  She feels "fine".  She lives in the memory care section of [facility], assisted living.  She needs help with her activities of daily living (dressing, bath, restroom, cutting meats for dinner).  She  does not cook or clean.  She walks with a walker.  She does not remember being admitted to the hospital or that she has a room mate.   Past Medical History:  Diagnosis Date  . Dementia   . Fall   . Hyperlipidemia   . Hypertension   . Macular degeneration   . Osteoporosis     History reviewed. No pertinent surgical history.  Family History  Problem Relation Age of Onset  . CVA Mother   . Dementia Father     Social History:  reports that she has quit smoking. She has never used smokeless tobacco. She reports that she does not drink alcohol or use drugs.  She previously smoked 1 pack/week.  She lives in assisted living.  She is in the 12 bed memory care section.  She has a room mate.  She has 1 son, [name], who lives in [place].  The patient is accompanied by by Mickel Baas, transportation person from [facility], and North, nurse navigator, today.  Allergies: No Known Allergies  Current Medications: Current Outpatient Prescriptions  Medication Sig Dispense Refill  . acetaminophen (TYLENOL) 325 MG tablet Take 650 mg by mouth every 4 (four) hours as needed.    Marland Kitchen acetaminophen (TYLENOL) 500 MG tablet Take 500 mg by mouth 2 (two) times daily.    Marland Kitchen ALPRAZolam (XANAX) 0.25 MG tablet Take 0.25 mg by mouth 3 (three) times daily as needed for anxiety.    Marland Kitchen amLODipine (NORVASC) 10 MG tablet Take 10 mg by mouth daily.     Marland Kitchen aspirin EC 81 MG EC tablet Take 1  tablet (81 mg total) by mouth daily. 30 tablet 0  . Cholecalciferol (VITAMIN D-1000 MAX ST) 1000 units tablet Take 1 tablet by mouth daily.    . Cranberry 450 MG CAPS Take by mouth.    . donepezil (ARICEPT) 5 MG tablet Take 5 mg by mouth at bedtime.    Marland Kitchen escitalopram (LEXAPRO) 10 MG tablet Take 10 mg by mouth at bedtime.    . hydrochlorothiazide (HYDRODIURIL) 25 MG tablet Take 25 mg by mouth daily.    Marland Kitchen loperamide (IMODIUM) 2 MG capsule Take 2 mg by mouth every 6 (six) hours as needed for diarrhea or loose stools.    . Melatonin 5 MG TABS  Take 1 tablet by mouth at bedtime.    . methimazole (TAPAZOLE) 10 MG tablet Take 10 mg by mouth daily.     . Multiple Vitamins-Minerals (PRESERVISION AREDS 2) CAPS Take 1 capsule by mouth 2 (two) times daily.    . ondansetron (ZOFRAN ODT) 4 MG disintegrating tablet Take 1 tablet (4 mg total) by mouth every 8 (eight) hours as needed for nausea or vomiting. 20 tablet 0  . potassium chloride SA (K-DUR,KLOR-CON) 20 MEQ tablet Take 20 mEq by mouth daily.      No current facility-administered medications for this visit.     Review of Systems:  GENERAL:  Feels "fine".  No fevers, sweats or weight loss. PERFORMANCE STATUS (ECOG):  3 HEENT:  Change in vision (non specific).  No runny nose, sore throat, mouth sores or tenderness. Lungs: No shortness of breath or cough.  No hemoptysis. Cardiac:  No chest pain, palpitations, orthopnea, or PND. GI:  No nausea, vomiting, diarrhea, constipation, melena or hematochezia. GU:  No urgency, frequency, dysuria, or hematuria. Musculoskeletal:  No back pain.  No joint pain.  No muscle tenderness. Extremities:  No pain or swelling. Skin:  No rashes or skin changes. Neuro:  Dementia.  No headache, numbness or weakness, balance or coordination issues. Endocrine:  No diabetes, thyroid issues, hot flashes or night sweats. Psych:  No mood changes, depression or anxiety. Pain:  No focal pain. Review of systems:  All other systems reviewed and found to be negative.  Physical Exam: Blood pressure 121/73, pulse 61, temperature (!) 96.7 F (35.9 C), temperature source Tympanic, resp. rate 20, height 5' 1.42" (1.56 m), weight 145 lb 14.4 oz (66.2 kg). GENERAL:  Well developed, well nourished, elderly woman sitting comfortably in a wheelchair in the exam room in no acute distress. MENTAL STATUS:  Alert and oriented to person.  Believes it is 57.  Knows she is in [place]. HEAD:  Short gray hair.  Normocephalic, atraumatic, face symmetric, no Cushingoid  features. EYES:  Blue eyes.  Pupils equal round and reactive to light and accomodation.  No conjunctivitis or scleral icterus. ENT:  Oropharynx clear without lesion.  Tongue normal. Mucous membranes moist.  RESPIRATORY:  Clear to auscultation without rales, wheezes or rhonchi. CARDIOVASCULAR:  Regular rate and rhythm without murmur, rub or gallop. ABDOMEN:  Soft, non-tender, with active bowel sounds, and no hepatosplenomegaly.  No masses. SKIN:  No rashes, ulcers or lesions. EXTREMITIES: No edema, no skin discoloration or tenderness.  No palpable cords. LYMPH NODES: No palpable cervical, supraclavicular, axillary or inguinal adenopathy  NEUROLOGICAL:  Initial states she is 11 then later 83 years old.  Does not know the president.  Remembers 3 of 3 words immediately and only 1 of 3 with prompting 10 minutes later.  She does not remember her recent  admission to the hospital. [facility]:  Appropriate.   No visits with results within 3 Day(s) from this visit.  Latest known visit with results is:  Admission on 07/02/2016, Discharged on 07/04/2016  Component Date Value Ref Range Status  . Color, Urine 07/02/2016 STRAW* YELLOW Final  . APPearance 07/02/2016 CLEAR* CLEAR Final  . Specific Gravity, Urine 07/02/2016 1.004* 1.005 - 1.030 Final  . pH 07/02/2016 5.0  5.0 - 8.0 Final  . Glucose, UA 07/02/2016 NEGATIVE  NEGATIVE mg/dL Final  . Hgb urine dipstick 07/02/2016 NEGATIVE  NEGATIVE Final  . Bilirubin Urine 07/02/2016 NEGATIVE  NEGATIVE Final  . Ketones, ur 07/02/2016 NEGATIVE  NEGATIVE mg/dL Final  . Protein, ur 07/02/2016 NEGATIVE  NEGATIVE mg/dL Final  . Nitrite 07/02/2016 NEGATIVE  NEGATIVE Final  . Leukocytes, UA 07/02/2016 NEGATIVE  NEGATIVE Final  . RBC / HPF 07/02/2016 0-5  0 - 5 RBC/hpf Final  . WBC, UA 07/02/2016 0-5  0 - 5 WBC/hpf Final  . Bacteria, UA 07/02/2016 NONE SEEN  NONE SEEN Final  . Squamous Epithelial / LPF 07/02/2016 NONE SEEN  NONE SEEN Final  . Sodium 07/02/2016 137  135  - 145 mmol/L Final  . Potassium 07/02/2016 3.9  3.5 - 5.1 mmol/L Final   HEMOLYSIS AT THIS LEVEL MAY AFFECT RESULT  . Chloride 07/02/2016 100* 101 - 111 mmol/L Final  . CO2 07/02/2016 28  22 - 32 mmol/L Final  . Glucose, Bld 07/02/2016 107* 65 - 99 mg/dL Final  . BUN 07/02/2016 14  6 - 20 mg/dL Final  . Creatinine, Ser 07/02/2016 0.86  0.44 - 1.00 mg/dL Final  . Calcium 07/02/2016 9.2  8.9 - 10.3 mg/dL Final  . GFR calc non Af Amer 07/02/2016 58* >60 mL/min Final  . GFR calc Af Amer 07/02/2016 >60  >60 mL/min Final   Comment: (NOTE) The eGFR has been calculated using the CKD EPI equation. This calculation has not been validated in all clinical situations. eGFR's persistently <60 mL/min signify possible Chronic Kidney Disease.   . Anion gap 07/02/2016 9  5 - 15 Final  . WBC 07/02/2016 9.7  3.6 - 11.0 K/uL Final  . RBC 07/02/2016 3.94  3.80 - 5.20 MIL/uL Final  . Hemoglobin 07/02/2016 12.4  12.0 - 16.0 g/dL Final  . HCT 07/02/2016 36.4  35.0 - 47.0 % Final  . MCV 07/02/2016 92.4  80.0 - 100.0 fL Final  . MCH 07/02/2016 31.5  26.0 - 34.0 pg Final  . MCHC 07/02/2016 34.1  32.0 - 36.0 g/dL Final  . RDW 07/02/2016 14.4  11.5 - 14.5 % Final  . Platelets 07/02/2016 263  150 - 440 K/uL Final  . Neutrophils Relative % 07/02/2016 67  % Final  . Neutro Abs 07/02/2016 6.5  1.4 - 6.5 K/uL Final  . Lymphocytes Relative 07/02/2016 20  % Final  . Lymphs Abs 07/02/2016 2.0  1.0 - 3.6 K/uL Final  . Monocytes Relative 07/02/2016 10  % Final  . Monocytes Absolute 07/02/2016 1.0* 0.2 - 0.9 K/uL Final  . Eosinophils Relative 07/02/2016 2  % Final  . Eosinophils Absolute 07/02/2016 0.2  0 - 0.7 K/uL Final  . Basophils Relative 07/02/2016 1  % Final  . Basophils Absolute 07/02/2016 0.1  0 - 0.1 K/uL Final  . Troponin I 07/02/2016 <0.03  <0.03 ng/mL Final  . Sodium 07/03/2016 137  135 - 145 mmol/L Final  . Potassium 07/03/2016 3.2* 3.5 - 5.1 mmol/L Final  . Chloride 07/03/2016 101  101 -  111 mmol/L  Final  . CO2 07/03/2016 28  22 - 32 mmol/L Final  . Glucose, Bld 07/03/2016 108* 65 - 99 mg/dL Final  . BUN 07/03/2016 10  6 - 20 mg/dL Final  . Creatinine, Ser 07/03/2016 0.66  0.44 - 1.00 mg/dL Final  . Calcium 07/03/2016 8.4* 8.9 - 10.3 mg/dL Final  . GFR calc non Af Amer 07/03/2016 >60  >60 mL/min Final  . GFR calc Af Amer 07/03/2016 >60  >60 mL/min Final   Comment: (NOTE) The eGFR has been calculated using the CKD EPI equation. This calculation has not been validated in all clinical situations. eGFR's persistently <60 mL/min signify possible Chronic Kidney Disease.   . Anion gap 07/03/2016 8  5 - 15 Final  . WBC 07/03/2016 9.2  3.6 - 11.0 K/uL Final  . RBC 07/03/2016 3.68* 3.80 - 5.20 MIL/uL Final  . Hemoglobin 07/03/2016 11.5* 12.0 - 16.0 g/dL Final  . HCT 07/03/2016 33.5* 35.0 - 47.0 % Final  . MCV 07/03/2016 91.1  80.0 - 100.0 fL Final  . MCH 07/03/2016 31.1  26.0 - 34.0 pg Final  . MCHC 07/03/2016 34.2  32.0 - 36.0 g/dL Final  . RDW 07/03/2016 14.2  11.5 - 14.5 % Final  . Platelets 07/03/2016 243  150 - 440 K/uL Final  . Weight 07/03/2016 2432  oz Final  . Height 07/03/2016 64  in Final  . BP 07/03/2016 146/71  mmHg Final  . Cholesterol 07/03/2016 185  0 - 200 mg/dL Final  . Triglycerides 07/03/2016 151* <150 mg/dL Final  . HDL 07/03/2016 36* >40 mg/dL Final  . Total CHOL/HDL Ratio 07/03/2016 5.1  RATIO Final  . VLDL 07/03/2016 30  0 - 40 mg/dL Final  . LDL Cholesterol 07/03/2016 119* 0 - 99 mg/dL Final   Comment:        Total Cholesterol/HDL:CHD Risk Coronary Heart Disease Risk Table                     Men   Women  1/2 Average Risk   3.4   3.3  Average Risk       5.0   4.4  2 X Average Risk   9.6   7.1  3 X Average Risk  23.4   11.0        Use the calculated Patient Ratio above and the CHD Risk Table to determine the patient's CHD Risk.        ATP III CLASSIFICATION (LDL):  <100     mg/dL   Optimal  100-129  mg/dL   Near or Above                     Optimal  130-159  mg/dL   Borderline  160-189  mg/dL   High  >190     mg/dL   Very High   . MRSA by PCR 07/03/2016 NEGATIVE  NEGATIVE Final   Comment:        The GeneXpert MRSA Assay (FDA approved for NASAL specimens only), is one component of a comprehensive MRSA colonization surveillance program. It is not intended to diagnose MRSA infection nor to guide or monitor treatment for MRSA infections.     Assessment:  Sarah [name] is a 83 y.o. female with an apical right lung mass c/w a T2 lung cancer.  She has additional nodules in the superior segment of the RLL which may represent synchronous or metastatic lesions (T4).  Chest CT  on 06/30/2016 revealed a 3.1 x 3.7 cm sub solid ground-glass mass in the apical segment right upper lobe, likely gradually progressive on chest radiographs dating back to 03/23/2010 and worrisome for primary bronchogenic carcinoma.  There was a 1.9 cm pure ground-glass nodule in the superior segment right lower lobe and a 4 mm RLL nodule.   She has dementia and lives in an assisted living facility.  Symptomatically, she denies any complaints.  Exam reveals dementia.  Plan: 1.  Discuss diagnosis, staging, and management of lung cancer.  Patient denies any symptoms.  Images were reviewed with the patient.  She is aware of the abnormality.  We discussed her thoughts about further evaluation (PET scan) and possible biopsy or intervention.  She did not feel that was necessary.  Unfortunately, her son was not available for the appointment.  We discussed reconvening when he is available.  We discusses issues of medical power of attorney. 2.  RTC for follow-up discussions when her son, [name], is available.   AddendumTelford Nab, RN spoke with Vicenta Dunning, patient's son, after the appointment.  Per patient's son, [name], he declined to set up an appointment to discuss further.  He thinks it would be best to not pursue any more scans at this time.   Lequita Asal, MD  07/11/2016, 9:01 AM

## 2016-07-11 NOTE — Telephone Encounter (Signed)
Spoke with son regarding visit with [name] this morning. Informed pt's son, Sarah [name], that [name] wanted to meet with him and the patient to discuss CT findings and further discuss options if desired. Per pt's son, Sarah [name], he declined to set up an appointment to discuss the patient with [name] and thinks it would be best to not pursue any more scans at this time. Informed [name] that I would let [name] know and to call us if needs anything else. [name] verbalized understanding.

## 2016-07-11 NOTE — ED Provider Notes (Addendum)
[facility] Emergency Department Provider Note   ____________________________________________   First MD Initiated Contact with Patient 07/11/16 1355     (approximate)  I have reviewed the triage vital signs and the nursing notes.   HISTORY  Chief Complaint Nausea; Emesis; and Hypertension  EM caveat: The patient has severe dementia limiting her evaluation and history taking somewhat  HPI Sarah [name] is a 83 y.o. female here reportedly for having possible "strokelike" symptoms at the assisted living today. I further discussed with the patient's son Sarah [name] and he reports that the patient seemed to have the same symptoms about a week and a half ago and was kept overnight for observation, but turned out she did not have a new stroke at that time. He reports that he was told that the patient vomited once or twice this morning at assisted living. Not certain of the time of onset. He does not know of anyone reporting symptoms of her not being able to talk or move, but rather they're concerned because her blood pressure was elevated and with the vomiting that this could represent a strokelike picture.  Past Medical History:  Diagnosis Date  . Dementia   . Fall   . Hyperlipidemia   . Hypertension   . Macular degeneration   . Osteoporosis     Patient Active Problem List   Diagnosis Date Noted  . Mass of upper lobe of right lung 07/11/2016  . CVA (cerebral vascular accident) ([place]) 07/02/2016    History reviewed. No pertinent surgical history.  Prior to Admission medications   Medication Sig Start Date End Date Taking? Authorizing Provider  acetaminophen (TYLENOL) 325 MG tablet Take 650 mg by mouth every 4 (four) hours as needed.   Yes [provider]  acetaminophen (TYLENOL) 500 MG tablet Take 500 mg by mouth 2 (two) times daily.   Yes [provider]  ALPRAZolam (XANAX) 0.25 MG tablet Take 0.25 mg by mouth 3 (three) times daily as needed  for anxiety.   Yes [provider]  amLODipine (NORVASC) 10 MG tablet Take 10 mg by mouth daily.    Yes [provider]  aspirin EC 81 MG EC tablet Take 1 tablet (81 mg total) by mouth daily. 07/05/16  Yes Mody, Ulice Bold, MD  Cholecalciferol (VITAMIN D-1000 MAX ST) 1000 units tablet Take 1 tablet by mouth daily.   Yes [provider]  Cranberry 450 MG CAPS Take by mouth.   Yes [provider]  donepezil (ARICEPT) 5 MG tablet Take 5 mg by mouth at bedtime.   Yes [provider]  escitalopram (LEXAPRO) 10 MG tablet Take 10 mg by mouth at bedtime.   Yes [provider]  hydrochlorothiazide (HYDRODIURIL) 25 MG tablet Take 25 mg by mouth daily.   Yes [provider]  loperamide (IMODIUM) 2 MG capsule Take 2 mg by mouth every 6 (six) hours as needed for diarrhea or loose stools.   Yes [provider]  Melatonin 5 MG TABS Take 1 tablet by mouth at bedtime.   Yes [provider]  methimazole (TAPAZOLE) 10 MG tablet Take 10 mg by mouth daily.    Yes [provider]  Multiple Vitamins-Minerals (PRESERVISION AREDS 2) CAPS Take 1 capsule by mouth 2 (two) times daily.   Yes [provider]  ondansetron (ZOFRAN ODT) 4 MG disintegrating tablet Take 1 tablet (4 mg total) by mouth every 8 (eight) hours as needed for nausea or vomiting. 06/30/16  Yes  Alfred Levins, [place], MD  potassium chloride SA (K-DUR,KLOR-CON) 20 MEQ tablet Take 20 mEq by mouth daily.    Yes [provider]    Allergies Patient has no known allergies.  Family History  Problem Relation Age of Onset  . CVA Mother   . Dementia Father     Social History Social History  Substance Use Topics  . Smoking status: Former Research scientist (life sciences)  . Smokeless tobacco: Never Used  . Alcohol use No    Review of Systems EM caveat ____________________________________________   PHYSICAL EXAM:  VITAL SIGNS: ED Triage Vitals [07/11/16 1304]  Enc Vitals Group      BP      Pulse      Resp      Temp 97.9 F (36.6 C)     Temp Source Oral     SpO2      Weight      Height      Head Circumference      Peak Flow      Pain Score      Pain Loc      Pain Edu?      Excl. in [place]?    Constitutional: Alert and oriented Only to self Eyes: Conjunctivae are normal. PERRL. EOMI. Head: Atraumatic. Nose: No congestion/rhinnorhea. Mouth/Throat: Mucous membranes are moist.  Oropharynx non-erythematous. Neck: No stridor.   Cardiovascular: Normal rate, regular rhythm. Grossly normal heart sounds.  Good peripheral circulation. Respiratory: Normal respiratory effort.  No retractions. Lungs CTAB. Gastrointestinal: Soft and nontender. No distention. Negative Murphy Musculoskeletal: No lower extremity tenderness nor edema.  No joint effusions. Neurologic:  Normal speech and though at times she seems confused. No gross focal neurologic deficits are appreciated. No pronator drift. Moves all extremities well without any notable deficits  Skin:  Skin is warm, dry and intact. No rash noted. Psychiatric: Mood and affect are normal. Speech and behavior are normal.  ____________________________________________   LABS (all labs ordered are listed, but only abnormal results are displayed)  Labs Reviewed  COMPREHENSIVE METABOLIC PANEL - Abnormal; Notable for the following:       Result Value   Chloride 100 (*)    GFR calc non Af Amer 53 (*)    All other components within normal limits  CBC WITH DIFFERENTIAL/PLATELET - Abnormal; Notable for the following:    WBC 14.7 (*)    Neutro Abs 12.2 (*)    Monocytes Absolute 1.0 (*)    All other components within normal limits  TROPONIN I - Abnormal; Notable for the following:    Troponin I 0.07 (*)    All other components within normal limits  URINALYSIS, COMPLETE (UACMP) WITH MICROSCOPIC - Abnormal; Notable for the following:    Color, Urine AMBER (*)    APPearance CLEAR (*)    Hgb urine dipstick SMALL (*)    Ketones,  ur 5 (*)    Squamous Epithelial / LPF 0-5 (*)    All other components within normal limits  CK   ____________________________________________  EKG  Reviewed and interpreted by me at 1305 Heart rate 60 Dressed 120 QTc 450 Slight baseline artifact, normal sinus rhythm no evidence of acute ischemia noted. ____________________________________________  RADIOLOGY  Ct Head Wo Contrast  Result Date: 07/11/2016 CLINICAL DATA:  Nausea emesis and hypertension EXAM: CT HEAD WITHOUT CONTRAST TECHNIQUE: Contiguous axial images were obtained from the base of the skull through the vertex without intravenous contrast. COMPARISON:  MRI 07/03/2016, 07/02/2016 FINDINGS: Brain: No acute territorial infarction, hemorrhage, or  intracranial mass is seen. Old left occipital infarct. Extensive periventricular and subcortical white matter small vessel ischemic changes. Moderate atrophy. Stable enlarged ventricles likely related to atrophy. Vascular: No hyperdense vessels. Carotid artery calcifications. Vertebral artery calcifications. Skull: No fracture or suspicious bone lesion. Sinuses/Orbits: No acute abnormality. Other: None IMPRESSION: 1. No CT evidence for acute intracranial abnormality 2. Old left occipital infarct. Extensive periventricular and subcortical white matter small vessel ischemic changes along with atrophy. Electronically Signed   By: Donavan Foil M.D.   On: 07/11/2016 15:27    ____________________________________________   PROCEDURES  Procedure(s) performed: None  Procedures  Critical Care performed: No  ____________________________________________   INITIAL IMPRESSION / ASSESSMENT AND PLAN / ED COURSE  Pertinent labs & imaging results that were available during my care of the patient were reviewed by me and considered in my medical decision making (see chart for details).  Patient presents for evaluation after episode of possible he nausea and vomiting. She seems to be improved  now, with no evidence of acuity noted but given her age and history of dementia, no initiate a broad workup including evaluation with labs, EKG, CT of the head, etc.  Clinical Course as of Jul 12 1550  Fri Jul 11, 2016  1405 Called, left a message to return my call with the patient's son Sarah [name].  [MQ]    Clinical Course User Index [MQ] Delman Kitten, MD   ----------------------------------------- 3:51 PM on 07/11/2016 -----------------------------------------  Discussed with patient and family, given the patient's usually elevated troponin reassuring EKG will admit her for further observation. She is awake and alert in no distress, denies any complaints at this time. Patient's son agreeable with plan for admission  ____________________________________________   FINAL CLINICAL IMPRESSION(S) / ED DIAGNOSES  Final diagnoses:  Vomiting without nausea, intractability of vomiting not specified, unspecified vomiting type  Elevated troponin      NEW MEDICATIONS STARTED DURING THIS VISIT:  New Prescriptions   No medications on file     Note:  This document was prepared using Dragon voice recognition software and may include unintentional dictation errors.     Delman Kitten, MD 07/11/16 1552   ----------------------------------------- 4:15 PM on 07/11/2016 -----------------------------------------  Patient will be discharged home., Patient was offered admission, and the family including the patient's son who is her medical decision maker and noted that given that they would never wish for her to have a cardiac catheterization and did not wish for any additional testing there would be likely little benefit to admission and observation especially given her goals of care and current hospice status. Family comfortable with the plan to be discharged, and at this juncture we will continue to manage her medically until follow-up closely with her doctor.   Delman Kitten, MD 07/11/16  [phone]

## 2016-07-11 NOTE — ED Notes (Signed)
[facility] Called for pt pickup. Thayer Headings will call back

## 2016-07-11 NOTE — ED Triage Notes (Signed)
Pt is from a facility.  Per EMS pt fell 4 days ago.  Hospice nurse called out due to pt having stroke symptoms of nausea, vomiting, and hypertension.  No vomiting with EMS.  Pt b/p upon arrival 151/96.

## 2016-07-11 NOTE — ED Notes (Signed)
Pt to be picked up by [facility]. [facility] on the way for pt. Pt notified. Pt resting comfortably at this time.

## 2016-07-11 NOTE — Consult Note (Signed)
[place] [facility] [place] NAME: Sarah [name]    MR#:  [id number]  DATE OF BIRTH:  02-18-1924  DATE OF ADMISSION:  07/11/2016  PRIMARY CARE PHYSICIAN: Raelyn Number, MD   CONSULT REQUESTING/REFERRING PHYSICIAN: Dr. Jacqualine Code  REASON FOR CONSULT: Elevated troponin  CHIEF COMPLAINT:   Chief Complaint  Patient presents with  . Nausea  . Emesis  . Hypertension    HISTORY OF PRESENT ILLNESS:  Sarah [name]  is a 83 y.o. female with a known history of Hypertension, CVA, dementia, macular degeneration presents to the hospital complaining of some nausea and vomiting from assisted living facility. Now she feels back to normal. Overall poor historian due to dementia. Family at bedside contribute most history. Reviewed old records. No abdominal pain. Troponin found to be elevated at 0.07 in ED  PAST MEDICAL HISTORY:   Past Medical History:  Diagnosis Date  . Dementia   . Fall   . Hyperlipidemia   . Hypertension   . Macular degeneration   . Osteoporosis     PAST SURGICAL HISTOIRY:  History reviewed. No pertinent surgical history.  SOCIAL HISTORY:   Social History  Substance Use Topics  . Smoking status: Former Research scientist (life sciences)  . Smokeless tobacco: Never Used  . Alcohol use No    FAMILY HISTORY:   Family History  Problem Relation Age of Onset  . CVA Mother   . Dementia Father     DRUG ALLERGIES:  No Known Allergies  REVIEW OF SYSTEMS:   ROS  CONSTITUTIONAL: No fever, fatigue or weakness.  EYES: No blurred or double vision.  EARS, NOSE, AND THROAT: No tinnitus or ear pain.  RESPIRATORY: No cough, shortness of breath, wheezing or hemoptysis.  CARDIOVASCULAR: No chest pain, orthopnea, edema.  GASTROINTESTINAL: Nausea earlier. Now resolved. GENITOURINARY: No dysuria, hematuria.  ENDOCRINE: No polyuria, nocturia,  HEMATOLOGY: No anemia, easy bruising or bleeding SKIN: No rash or lesion. MUSCULOSKELETAL: No joint pain or arthritis.    NEUROLOGIC: No tingling, numbness, weakness.  PSYCHIATRY: No anxiety or depression.   MEDICATIONS AT HOME:   Prior to Admission medications   Medication Sig Start Date End Date Taking? Authorizing Provider  acetaminophen (TYLENOL) 325 MG tablet Take 650 mg by mouth every 4 (four) hours as needed.   Yes [provider]  acetaminophen (TYLENOL) 500 MG tablet Take 500 mg by mouth 2 (two) times daily.   Yes [provider]  ALPRAZolam (XANAX) 0.25 MG tablet Take 0.25 mg by mouth 3 (three) times daily as needed for anxiety.   Yes [provider]  amLODipine (NORVASC) 10 MG tablet Take 10 mg by mouth daily.    Yes [provider]  aspirin EC 81 MG EC tablet Take 1 tablet (81 mg total) by mouth daily. 07/05/16  Yes Mody, Ulice Bold, MD  Cholecalciferol (VITAMIN D-1000 MAX ST) 1000 units tablet Take 1 tablet by mouth daily.   Yes [provider]  Cranberry 450 MG CAPS Take by mouth.   Yes [provider]  donepezil (ARICEPT) 5 MG tablet Take 5 mg by mouth at bedtime.   Yes [provider]  escitalopram (LEXAPRO) 10 MG tablet Take 10 mg by mouth at bedtime.   Yes [provider]  hydrochlorothiazide (HYDRODIURIL) 25 MG tablet Take 25 mg by mouth daily.   Yes [provider]  loperamide (IMODIUM) 2 MG capsule Take 2 mg by mouth every 6 (six) hours as needed for diarrhea or loose stools.  Yes [provider]  Melatonin 5 MG TABS Take 1 tablet by mouth at bedtime.   Yes [provider]  methimazole (TAPAZOLE) 10 MG tablet Take 10 mg by mouth daily.    Yes [provider]  Multiple Vitamins-Minerals (PRESERVISION AREDS 2) CAPS Take 1 capsule by mouth 2 (two) times daily.   Yes [provider]  ondansetron (ZOFRAN ODT) 4 MG disintegrating tablet Take 1 tablet (4 mg total) by mouth every 8 (eight) hours as needed for nausea or vomiting. 06/30/16  Yes Alfred Levins, [place], MD  potassium chloride SA  (K-DUR,KLOR-CON) 20 MEQ tablet Take 20 mEq by mouth daily.    Yes [provider]      VITAL SIGNS:  Blood pressure (!) 160/61, pulse 63, temperature 97.9 F (36.6 C), temperature source Oral, resp. rate (!) 23, SpO2 95 %.  PHYSICAL EXAMINATION:  GENERAL:  83 y.o.-year-old patient lying in the bed with no acute distress.  EYES:  No scleral icterus. Extraocular muscles intact.  HEENT: Head atraumatic, normocephalic. Oropharynx and nasopharynx clear.  NECK:  Supple, no jugular venous distention. No thyroid enlargement, no tenderness.  LUNGS: Normal breath sounds bilaterally, no wheezing, rales,rhonchi or crepitation. No use of accessory muscles of respiration.  CARDIOVASCULAR: S1, S2 normal. No murmurs, rubs, or gallops.  ABDOMEN: Soft, nontender, nondistended. Bowel sounds present. No organomegaly or mass.  EXTREMITIES: No pedal edema, cyanosis, or clubbing.  NEUROLOGIC: Cranial nerves II through XII are intact. Muscle strength 5/5 in all extremities. Sensation intact. Gait not checked.  PSYCHIATRIC: The patient is alert and awake. Pleasant SKIN: No obvious rash, lesion, or ulcer.   LABORATORY PANEL:   CBC  Recent Labs Lab 07/11/16 1415  WBC 14.7*  HGB 12.9  HCT 37.7  PLT 305   ------------------------------------------------------------------------------------------------------------------  Chemistries   Recent Labs Lab 07/11/16 1415  NA 136  K 3.8  CL 100*  CO2 25  GLUCOSE 97  BUN 19  CREATININE 0.92  CALCIUM 9.2  AST 27  ALT 14  ALKPHOS 88  BILITOT 0.5   ------------------------------------------------------------------------------------------------------------------  Cardiac Enzymes  Recent Labs Lab 07/11/16 1415  TROPONINI 0.07*   ------------------------------------------------------------------------------------------------------------------  RADIOLOGY:  Ct Head Wo Contrast  Result Date: 07/11/2016 CLINICAL DATA:  Nausea emesis and  hypertension EXAM: CT HEAD WITHOUT CONTRAST TECHNIQUE: Contiguous axial images were obtained from the base of the skull through the vertex without intravenous contrast. COMPARISON:  MRI 07/03/2016, 07/02/2016 FINDINGS: Brain: No acute territorial infarction, hemorrhage, or intracranial mass is seen. Old left occipital infarct. Extensive periventricular and subcortical white matter small vessel ischemic changes. Moderate atrophy. Stable enlarged ventricles likely related to atrophy. Vascular: No hyperdense vessels. Carotid artery calcifications. Vertebral artery calcifications. Skull: No fracture or suspicious bone lesion. Sinuses/Orbits: No acute abnormality. Other: None IMPRESSION: 1. No CT evidence for acute intracranial abnormality 2. Old left occipital infarct. Extensive periventricular and subcortical white matter small vessel ischemic changes along with atrophy. Electronically Signed   By: Donavan Foil M.D.   On: 07/11/2016 15:27    EKG:   Orders placed or performed during the hospital encounter of 07/11/16  . ED EKG  . ED EKG  . EKG 12-Lead  . EKG 12-Lead    IMPRESSION AND PLAN:   * Elevated troponin Minimal without chest pain or shortness of breath. Etiology is unclear. Discussed with family at bedside. Son her healthcare part of attorney mentions that they would not want to pursue any stress test or cardiac catheterization. It was then decided that she  needs to be kept comfortable if there is any worsening. We discussed that even if she has a heart attack and we are not pursuing any procedures treatment discotomy medical management with aspirin, statin and beta blockers. Patient can be discharged back to her assisted living facility for conservative management. Has hospice services available.  * Vomiting. It is unclear if patient threw up or had nausea. Patient does have a dementia and is a poor historian. At this time she does not have any abdominal tenderness. No signs of infection.  She is asking to eat some food and feels hungry. Needs prescription for some nausea medications with Zofran. However advised the family to return if there is any abdominal pain, severe diarrhea or fever or worsening vomiting.  * Hypertension. Continue medications.    All the records are reviewed and case discussed with Consulting provider. Management plans discussed with the patient, family and they are in agreement.  CODE STATUS: DNR  TOTAL TIME TAKING CARE OF THIS PATIENT: 45 minutes.    Hillary Bow R M.D on 07/11/2016 at 4:26 PM  Between 7am to 6pm - Pager - [phone]  After 6pm go to [email or web address] - password [email or web address] [facility]  Office  [phone]  CC: Primary care Physician: Raelyn Number, MD     Note: This dictation was prepared with Dragon dictation along with smaller phrase technology. Any transcriptional errors that result from this process are unintentional.

## 2016-07-11 NOTE — Discharge Instructions (Signed)
Please continue to take a daily baby aspirin (81 mg).  Return to the Emergency Department (ED) if you experience any further concerns or develop chest pain/pressure/tightness, difficulty breathing, or sudden sweating, or other symptoms that concern you.

## 2016-07-11 NOTE — ED Notes (Signed)
Pt unable to sign. Thayer Headings from [facility] picked up pt. Discharge instructions reviewed. No questions at this time.

## 2016-07-12 DIAGNOSIS — G933 Postviral fatigue syndrome: Secondary | ICD-10-CM | POA: Diagnosis not present

## 2016-07-13 DIAGNOSIS — G933 Postviral fatigue syndrome: Secondary | ICD-10-CM | POA: Diagnosis not present

## 2016-07-14 DIAGNOSIS — G933 Postviral fatigue syndrome: Secondary | ICD-10-CM | POA: Diagnosis not present

## 2016-07-15 DIAGNOSIS — G933 Postviral fatigue syndrome: Secondary | ICD-10-CM | POA: Diagnosis not present

## 2016-07-16 DIAGNOSIS — R2689 Other abnormalities of gait and mobility: Secondary | ICD-10-CM | POA: Diagnosis not present

## 2016-07-16 DIAGNOSIS — R296 Repeated falls: Secondary | ICD-10-CM | POA: Diagnosis not present

## 2016-07-16 DIAGNOSIS — H3530 Unspecified macular degeneration: Secondary | ICD-10-CM | POA: Diagnosis not present

## 2016-07-16 DIAGNOSIS — H548 Legal blindness, as defined in USA: Secondary | ICD-10-CM | POA: Diagnosis not present

## 2016-07-16 DIAGNOSIS — I10 Essential (primary) hypertension: Secondary | ICD-10-CM | POA: Diagnosis not present

## 2016-07-16 DIAGNOSIS — F419 Anxiety disorder, unspecified: Secondary | ICD-10-CM | POA: Diagnosis not present

## 2016-07-16 DIAGNOSIS — G933 Postviral fatigue syndrome: Secondary | ICD-10-CM | POA: Diagnosis not present

## 2016-07-16 DIAGNOSIS — F329 Major depressive disorder, single episode, unspecified: Secondary | ICD-10-CM | POA: Diagnosis not present

## 2016-07-16 DIAGNOSIS — F0390 Unspecified dementia without behavioral disturbance: Secondary | ICD-10-CM | POA: Diagnosis not present

## 2016-07-16 DIAGNOSIS — E785 Hyperlipidemia, unspecified: Secondary | ICD-10-CM | POA: Diagnosis not present

## 2016-07-17 DIAGNOSIS — H548 Legal blindness, as defined in USA: Secondary | ICD-10-CM | POA: Diagnosis not present

## 2016-07-17 DIAGNOSIS — I10 Essential (primary) hypertension: Secondary | ICD-10-CM | POA: Diagnosis not present

## 2016-07-17 DIAGNOSIS — F419 Anxiety disorder, unspecified: Secondary | ICD-10-CM | POA: Diagnosis not present

## 2016-07-17 DIAGNOSIS — F0390 Unspecified dementia without behavioral disturbance: Secondary | ICD-10-CM | POA: Diagnosis not present

## 2016-07-17 DIAGNOSIS — R2689 Other abnormalities of gait and mobility: Secondary | ICD-10-CM | POA: Diagnosis not present

## 2016-07-17 DIAGNOSIS — E785 Hyperlipidemia, unspecified: Secondary | ICD-10-CM | POA: Diagnosis not present

## 2016-07-17 DIAGNOSIS — R296 Repeated falls: Secondary | ICD-10-CM | POA: Diagnosis not present

## 2016-07-17 DIAGNOSIS — G933 Postviral fatigue syndrome: Secondary | ICD-10-CM | POA: Diagnosis not present

## 2016-07-17 DIAGNOSIS — H3530 Unspecified macular degeneration: Secondary | ICD-10-CM | POA: Diagnosis not present

## 2016-07-17 DIAGNOSIS — F329 Major depressive disorder, single episode, unspecified: Secondary | ICD-10-CM | POA: Diagnosis not present

## 2016-07-18 DIAGNOSIS — F0390 Unspecified dementia without behavioral disturbance: Secondary | ICD-10-CM | POA: Diagnosis not present

## 2016-07-18 DIAGNOSIS — E785 Hyperlipidemia, unspecified: Secondary | ICD-10-CM | POA: Diagnosis not present

## 2016-07-18 DIAGNOSIS — I10 Essential (primary) hypertension: Secondary | ICD-10-CM | POA: Diagnosis not present

## 2016-07-18 DIAGNOSIS — F419 Anxiety disorder, unspecified: Secondary | ICD-10-CM | POA: Diagnosis not present

## 2016-07-18 DIAGNOSIS — G933 Postviral fatigue syndrome: Secondary | ICD-10-CM | POA: Diagnosis not present

## 2016-07-18 DIAGNOSIS — F329 Major depressive disorder, single episode, unspecified: Secondary | ICD-10-CM | POA: Diagnosis not present

## 2016-07-18 DIAGNOSIS — H548 Legal blindness, as defined in USA: Secondary | ICD-10-CM | POA: Diagnosis not present

## 2016-07-18 DIAGNOSIS — H3530 Unspecified macular degeneration: Secondary | ICD-10-CM | POA: Diagnosis not present

## 2016-07-18 DIAGNOSIS — R296 Repeated falls: Secondary | ICD-10-CM | POA: Diagnosis not present

## 2016-07-18 DIAGNOSIS — R2689 Other abnormalities of gait and mobility: Secondary | ICD-10-CM | POA: Diagnosis not present

## 2016-07-19 DIAGNOSIS — G933 Postviral fatigue syndrome: Secondary | ICD-10-CM | POA: Diagnosis not present

## 2016-07-20 DIAGNOSIS — G933 Postviral fatigue syndrome: Secondary | ICD-10-CM | POA: Diagnosis not present

## 2016-07-21 DIAGNOSIS — H3530 Unspecified macular degeneration: Secondary | ICD-10-CM | POA: Diagnosis not present

## 2016-07-21 DIAGNOSIS — F0390 Unspecified dementia without behavioral disturbance: Secondary | ICD-10-CM | POA: Diagnosis not present

## 2016-07-21 DIAGNOSIS — F419 Anxiety disorder, unspecified: Secondary | ICD-10-CM | POA: Diagnosis not present

## 2016-07-21 DIAGNOSIS — G933 Postviral fatigue syndrome: Secondary | ICD-10-CM | POA: Diagnosis not present

## 2016-07-21 DIAGNOSIS — H548 Legal blindness, as defined in USA: Secondary | ICD-10-CM | POA: Diagnosis not present

## 2016-07-21 DIAGNOSIS — F329 Major depressive disorder, single episode, unspecified: Secondary | ICD-10-CM | POA: Diagnosis not present

## 2016-07-21 DIAGNOSIS — R296 Repeated falls: Secondary | ICD-10-CM | POA: Diagnosis not present

## 2016-07-21 DIAGNOSIS — E785 Hyperlipidemia, unspecified: Secondary | ICD-10-CM | POA: Diagnosis not present

## 2016-07-21 DIAGNOSIS — R2689 Other abnormalities of gait and mobility: Secondary | ICD-10-CM | POA: Diagnosis not present

## 2016-07-21 DIAGNOSIS — I10 Essential (primary) hypertension: Secondary | ICD-10-CM | POA: Diagnosis not present

## 2016-07-22 DIAGNOSIS — R2689 Other abnormalities of gait and mobility: Secondary | ICD-10-CM | POA: Diagnosis not present

## 2016-07-22 DIAGNOSIS — I10 Essential (primary) hypertension: Secondary | ICD-10-CM | POA: Diagnosis not present

## 2016-07-22 DIAGNOSIS — F0390 Unspecified dementia without behavioral disturbance: Secondary | ICD-10-CM | POA: Diagnosis not present

## 2016-07-22 DIAGNOSIS — G933 Postviral fatigue syndrome: Secondary | ICD-10-CM | POA: Diagnosis not present

## 2016-07-22 DIAGNOSIS — R296 Repeated falls: Secondary | ICD-10-CM | POA: Diagnosis not present

## 2016-07-23 DIAGNOSIS — G933 Postviral fatigue syndrome: Secondary | ICD-10-CM | POA: Diagnosis not present

## 2016-07-24 DIAGNOSIS — H548 Legal blindness, as defined in USA: Secondary | ICD-10-CM | POA: Diagnosis not present

## 2016-07-24 DIAGNOSIS — F419 Anxiety disorder, unspecified: Secondary | ICD-10-CM | POA: Diagnosis not present

## 2016-07-24 DIAGNOSIS — F0390 Unspecified dementia without behavioral disturbance: Secondary | ICD-10-CM | POA: Diagnosis not present

## 2016-07-24 DIAGNOSIS — E785 Hyperlipidemia, unspecified: Secondary | ICD-10-CM | POA: Diagnosis not present

## 2016-07-24 DIAGNOSIS — F329 Major depressive disorder, single episode, unspecified: Secondary | ICD-10-CM | POA: Diagnosis not present

## 2016-07-24 DIAGNOSIS — H3530 Unspecified macular degeneration: Secondary | ICD-10-CM | POA: Diagnosis not present

## 2016-07-24 DIAGNOSIS — G933 Postviral fatigue syndrome: Secondary | ICD-10-CM | POA: Diagnosis not present

## 2016-07-24 DIAGNOSIS — R2689 Other abnormalities of gait and mobility: Secondary | ICD-10-CM | POA: Diagnosis not present

## 2016-07-24 DIAGNOSIS — I10 Essential (primary) hypertension: Secondary | ICD-10-CM | POA: Diagnosis not present

## 2016-07-24 DIAGNOSIS — R296 Repeated falls: Secondary | ICD-10-CM | POA: Diagnosis not present

## 2016-07-25 DIAGNOSIS — G933 Postviral fatigue syndrome: Secondary | ICD-10-CM | POA: Diagnosis not present

## 2016-07-26 DIAGNOSIS — G933 Postviral fatigue syndrome: Secondary | ICD-10-CM | POA: Diagnosis not present

## 2016-07-27 DIAGNOSIS — G933 Postviral fatigue syndrome: Secondary | ICD-10-CM | POA: Diagnosis not present

## 2016-07-28 DIAGNOSIS — G933 Postviral fatigue syndrome: Secondary | ICD-10-CM | POA: Diagnosis not present

## 2016-07-28 DIAGNOSIS — F0390 Unspecified dementia without behavioral disturbance: Secondary | ICD-10-CM | POA: Diagnosis not present

## 2016-07-28 DIAGNOSIS — H3530 Unspecified macular degeneration: Secondary | ICD-10-CM | POA: Diagnosis not present

## 2016-07-28 DIAGNOSIS — I10 Essential (primary) hypertension: Secondary | ICD-10-CM | POA: Diagnosis not present

## 2016-07-28 DIAGNOSIS — F419 Anxiety disorder, unspecified: Secondary | ICD-10-CM | POA: Diagnosis not present

## 2016-07-28 DIAGNOSIS — R2689 Other abnormalities of gait and mobility: Secondary | ICD-10-CM | POA: Diagnosis not present

## 2016-07-28 DIAGNOSIS — F329 Major depressive disorder, single episode, unspecified: Secondary | ICD-10-CM | POA: Diagnosis not present

## 2016-07-28 DIAGNOSIS — R296 Repeated falls: Secondary | ICD-10-CM | POA: Diagnosis not present

## 2016-07-28 DIAGNOSIS — E785 Hyperlipidemia, unspecified: Secondary | ICD-10-CM | POA: Diagnosis not present

## 2016-07-28 DIAGNOSIS — H548 Legal blindness, as defined in USA: Secondary | ICD-10-CM | POA: Diagnosis not present

## 2016-07-29 DIAGNOSIS — G933 Postviral fatigue syndrome: Secondary | ICD-10-CM | POA: Diagnosis not present

## 2016-07-30 DIAGNOSIS — F419 Anxiety disorder, unspecified: Secondary | ICD-10-CM | POA: Diagnosis not present

## 2016-07-30 DIAGNOSIS — F0151 Vascular dementia with behavioral disturbance: Secondary | ICD-10-CM | POA: Diagnosis not present

## 2016-07-30 DIAGNOSIS — G933 Postviral fatigue syndrome: Secondary | ICD-10-CM | POA: Diagnosis not present

## 2016-07-31 DIAGNOSIS — R296 Repeated falls: Secondary | ICD-10-CM | POA: Diagnosis not present

## 2016-07-31 DIAGNOSIS — F419 Anxiety disorder, unspecified: Secondary | ICD-10-CM | POA: Diagnosis not present

## 2016-07-31 DIAGNOSIS — R2689 Other abnormalities of gait and mobility: Secondary | ICD-10-CM | POA: Diagnosis not present

## 2016-07-31 DIAGNOSIS — H3530 Unspecified macular degeneration: Secondary | ICD-10-CM | POA: Diagnosis not present

## 2016-07-31 DIAGNOSIS — G933 Postviral fatigue syndrome: Secondary | ICD-10-CM | POA: Diagnosis not present

## 2016-07-31 DIAGNOSIS — H548 Legal blindness, as defined in USA: Secondary | ICD-10-CM | POA: Diagnosis not present

## 2016-07-31 DIAGNOSIS — F0390 Unspecified dementia without behavioral disturbance: Secondary | ICD-10-CM | POA: Diagnosis not present

## 2016-07-31 DIAGNOSIS — F329 Major depressive disorder, single episode, unspecified: Secondary | ICD-10-CM | POA: Diagnosis not present

## 2016-07-31 DIAGNOSIS — I10 Essential (primary) hypertension: Secondary | ICD-10-CM | POA: Diagnosis not present

## 2016-07-31 DIAGNOSIS — E785 Hyperlipidemia, unspecified: Secondary | ICD-10-CM | POA: Diagnosis not present

## 2016-08-01 DIAGNOSIS — Z79899 Other long term (current) drug therapy: Secondary | ICD-10-CM | POA: Diagnosis not present

## 2016-08-01 DIAGNOSIS — E038 Other specified hypothyroidism: Secondary | ICD-10-CM | POA: Diagnosis not present

## 2016-08-01 DIAGNOSIS — G933 Postviral fatigue syndrome: Secondary | ICD-10-CM | POA: Diagnosis not present

## 2016-08-02 DIAGNOSIS — G933 Postviral fatigue syndrome: Secondary | ICD-10-CM | POA: Diagnosis not present

## 2016-08-03 DIAGNOSIS — G933 Postviral fatigue syndrome: Secondary | ICD-10-CM | POA: Diagnosis not present

## 2016-08-04 DIAGNOSIS — G933 Postviral fatigue syndrome: Secondary | ICD-10-CM | POA: Diagnosis not present

## 2016-08-04 DIAGNOSIS — R2689 Other abnormalities of gait and mobility: Secondary | ICD-10-CM | POA: Diagnosis not present

## 2016-08-04 DIAGNOSIS — H3530 Unspecified macular degeneration: Secondary | ICD-10-CM | POA: Diagnosis not present

## 2016-08-04 DIAGNOSIS — G309 Alzheimer's disease, unspecified: Secondary | ICD-10-CM | POA: Diagnosis not present

## 2016-08-04 DIAGNOSIS — E785 Hyperlipidemia, unspecified: Secondary | ICD-10-CM | POA: Diagnosis not present

## 2016-08-04 DIAGNOSIS — F0390 Unspecified dementia without behavioral disturbance: Secondary | ICD-10-CM | POA: Diagnosis not present

## 2016-08-04 DIAGNOSIS — F329 Major depressive disorder, single episode, unspecified: Secondary | ICD-10-CM | POA: Diagnosis not present

## 2016-08-04 DIAGNOSIS — I10 Essential (primary) hypertension: Secondary | ICD-10-CM | POA: Diagnosis not present

## 2016-08-04 DIAGNOSIS — R296 Repeated falls: Secondary | ICD-10-CM | POA: Diagnosis not present

## 2016-08-04 DIAGNOSIS — F419 Anxiety disorder, unspecified: Secondary | ICD-10-CM | POA: Diagnosis not present

## 2016-08-04 DIAGNOSIS — H548 Legal blindness, as defined in USA: Secondary | ICD-10-CM | POA: Diagnosis not present

## 2016-08-05 DIAGNOSIS — F0390 Unspecified dementia without behavioral disturbance: Secondary | ICD-10-CM | POA: Diagnosis not present

## 2016-08-05 DIAGNOSIS — G933 Postviral fatigue syndrome: Secondary | ICD-10-CM | POA: Diagnosis not present

## 2016-08-05 DIAGNOSIS — R2689 Other abnormalities of gait and mobility: Secondary | ICD-10-CM | POA: Diagnosis not present

## 2016-08-05 DIAGNOSIS — H3530 Unspecified macular degeneration: Secondary | ICD-10-CM | POA: Diagnosis not present

## 2016-08-05 DIAGNOSIS — H548 Legal blindness, as defined in USA: Secondary | ICD-10-CM | POA: Diagnosis not present

## 2016-08-05 DIAGNOSIS — F329 Major depressive disorder, single episode, unspecified: Secondary | ICD-10-CM | POA: Diagnosis not present

## 2016-08-05 DIAGNOSIS — E785 Hyperlipidemia, unspecified: Secondary | ICD-10-CM | POA: Diagnosis not present

## 2016-08-05 DIAGNOSIS — I10 Essential (primary) hypertension: Secondary | ICD-10-CM | POA: Diagnosis not present

## 2016-08-05 DIAGNOSIS — F419 Anxiety disorder, unspecified: Secondary | ICD-10-CM | POA: Diagnosis not present

## 2016-08-05 DIAGNOSIS — R296 Repeated falls: Secondary | ICD-10-CM | POA: Diagnosis not present

## 2016-08-06 DIAGNOSIS — F419 Anxiety disorder, unspecified: Secondary | ICD-10-CM | POA: Diagnosis not present

## 2016-08-06 DIAGNOSIS — F0151 Vascular dementia with behavioral disturbance: Secondary | ICD-10-CM | POA: Diagnosis not present

## 2016-08-06 DIAGNOSIS — G933 Postviral fatigue syndrome: Secondary | ICD-10-CM | POA: Diagnosis not present

## 2016-08-07 DIAGNOSIS — I10 Essential (primary) hypertension: Secondary | ICD-10-CM | POA: Diagnosis not present

## 2016-08-07 DIAGNOSIS — H3530 Unspecified macular degeneration: Secondary | ICD-10-CM | POA: Diagnosis not present

## 2016-08-07 DIAGNOSIS — G933 Postviral fatigue syndrome: Secondary | ICD-10-CM | POA: Diagnosis not present

## 2016-08-07 DIAGNOSIS — R2689 Other abnormalities of gait and mobility: Secondary | ICD-10-CM | POA: Diagnosis not present

## 2016-08-07 DIAGNOSIS — H548 Legal blindness, as defined in USA: Secondary | ICD-10-CM | POA: Diagnosis not present

## 2016-08-07 DIAGNOSIS — F0390 Unspecified dementia without behavioral disturbance: Secondary | ICD-10-CM | POA: Diagnosis not present

## 2016-08-07 DIAGNOSIS — F329 Major depressive disorder, single episode, unspecified: Secondary | ICD-10-CM | POA: Diagnosis not present

## 2016-08-07 DIAGNOSIS — E785 Hyperlipidemia, unspecified: Secondary | ICD-10-CM | POA: Diagnosis not present

## 2016-08-07 DIAGNOSIS — R296 Repeated falls: Secondary | ICD-10-CM | POA: Diagnosis not present

## 2016-08-07 DIAGNOSIS — F419 Anxiety disorder, unspecified: Secondary | ICD-10-CM | POA: Diagnosis not present

## 2016-08-08 DIAGNOSIS — G933 Postviral fatigue syndrome: Secondary | ICD-10-CM | POA: Diagnosis not present

## 2016-08-09 DIAGNOSIS — G933 Postviral fatigue syndrome: Secondary | ICD-10-CM | POA: Diagnosis not present

## 2016-08-10 DIAGNOSIS — G933 Postviral fatigue syndrome: Secondary | ICD-10-CM | POA: Diagnosis not present

## 2016-08-11 DIAGNOSIS — E782 Mixed hyperlipidemia: Secondary | ICD-10-CM | POA: Diagnosis not present

## 2016-08-11 DIAGNOSIS — E038 Other specified hypothyroidism: Secondary | ICD-10-CM | POA: Diagnosis not present

## 2016-08-11 DIAGNOSIS — D518 Other vitamin B12 deficiency anemias: Secondary | ICD-10-CM | POA: Diagnosis not present

## 2016-08-11 DIAGNOSIS — E559 Vitamin D deficiency, unspecified: Secondary | ICD-10-CM | POA: Diagnosis not present

## 2016-08-11 DIAGNOSIS — G933 Postviral fatigue syndrome: Secondary | ICD-10-CM | POA: Diagnosis not present

## 2016-08-11 DIAGNOSIS — Z79899 Other long term (current) drug therapy: Secondary | ICD-10-CM | POA: Diagnosis not present

## 2016-08-11 DIAGNOSIS — E119 Type 2 diabetes mellitus without complications: Secondary | ICD-10-CM | POA: Diagnosis not present

## 2016-08-12 DIAGNOSIS — F0390 Unspecified dementia without behavioral disturbance: Secondary | ICD-10-CM | POA: Diagnosis not present

## 2016-08-12 DIAGNOSIS — R296 Repeated falls: Secondary | ICD-10-CM | POA: Diagnosis not present

## 2016-08-12 DIAGNOSIS — G933 Postviral fatigue syndrome: Secondary | ICD-10-CM | POA: Diagnosis not present

## 2016-08-12 DIAGNOSIS — I10 Essential (primary) hypertension: Secondary | ICD-10-CM | POA: Diagnosis not present

## 2016-08-12 DIAGNOSIS — E785 Hyperlipidemia, unspecified: Secondary | ICD-10-CM | POA: Diagnosis not present

## 2016-08-12 DIAGNOSIS — F329 Major depressive disorder, single episode, unspecified: Secondary | ICD-10-CM | POA: Diagnosis not present

## 2016-08-12 DIAGNOSIS — H548 Legal blindness, as defined in USA: Secondary | ICD-10-CM | POA: Diagnosis not present

## 2016-08-12 DIAGNOSIS — H3530 Unspecified macular degeneration: Secondary | ICD-10-CM | POA: Diagnosis not present

## 2016-08-12 DIAGNOSIS — F419 Anxiety disorder, unspecified: Secondary | ICD-10-CM | POA: Diagnosis not present

## 2016-08-12 DIAGNOSIS — R2689 Other abnormalities of gait and mobility: Secondary | ICD-10-CM | POA: Diagnosis not present

## 2016-08-13 DIAGNOSIS — G933 Postviral fatigue syndrome: Secondary | ICD-10-CM | POA: Diagnosis not present

## 2016-08-13 DIAGNOSIS — F419 Anxiety disorder, unspecified: Secondary | ICD-10-CM | POA: Diagnosis not present

## 2016-08-13 DIAGNOSIS — R32 Unspecified urinary incontinence: Secondary | ICD-10-CM | POA: Diagnosis not present

## 2016-08-13 DIAGNOSIS — F0151 Vascular dementia with behavioral disturbance: Secondary | ICD-10-CM | POA: Diagnosis not present

## 2016-08-13 DIAGNOSIS — F0390 Unspecified dementia without behavioral disturbance: Secondary | ICD-10-CM | POA: Diagnosis not present

## 2016-08-14 DIAGNOSIS — E785 Hyperlipidemia, unspecified: Secondary | ICD-10-CM | POA: Diagnosis not present

## 2016-08-14 DIAGNOSIS — F329 Major depressive disorder, single episode, unspecified: Secondary | ICD-10-CM | POA: Diagnosis not present

## 2016-08-14 DIAGNOSIS — H548 Legal blindness, as defined in USA: Secondary | ICD-10-CM | POA: Diagnosis not present

## 2016-08-14 DIAGNOSIS — H3530 Unspecified macular degeneration: Secondary | ICD-10-CM | POA: Diagnosis not present

## 2016-08-14 DIAGNOSIS — G933 Postviral fatigue syndrome: Secondary | ICD-10-CM | POA: Diagnosis not present

## 2016-08-14 DIAGNOSIS — F0390 Unspecified dementia without behavioral disturbance: Secondary | ICD-10-CM | POA: Diagnosis not present

## 2016-08-14 DIAGNOSIS — F419 Anxiety disorder, unspecified: Secondary | ICD-10-CM | POA: Diagnosis not present

## 2016-08-14 DIAGNOSIS — I10 Essential (primary) hypertension: Secondary | ICD-10-CM | POA: Diagnosis not present

## 2016-08-14 DIAGNOSIS — R2689 Other abnormalities of gait and mobility: Secondary | ICD-10-CM | POA: Diagnosis not present

## 2016-08-14 DIAGNOSIS — R296 Repeated falls: Secondary | ICD-10-CM | POA: Diagnosis not present

## 2016-08-15 DIAGNOSIS — G933 Postviral fatigue syndrome: Secondary | ICD-10-CM | POA: Diagnosis not present

## 2016-08-16 DIAGNOSIS — G933 Postviral fatigue syndrome: Secondary | ICD-10-CM | POA: Diagnosis not present

## 2016-08-17 DIAGNOSIS — G933 Postviral fatigue syndrome: Secondary | ICD-10-CM | POA: Diagnosis not present

## 2016-08-18 DIAGNOSIS — F419 Anxiety disorder, unspecified: Secondary | ICD-10-CM | POA: Diagnosis not present

## 2016-08-18 DIAGNOSIS — F0151 Vascular dementia with behavioral disturbance: Secondary | ICD-10-CM | POA: Diagnosis not present

## 2016-08-18 DIAGNOSIS — G933 Postviral fatigue syndrome: Secondary | ICD-10-CM | POA: Diagnosis not present

## 2016-08-19 DIAGNOSIS — R1310 Dysphagia, unspecified: Secondary | ICD-10-CM | POA: Diagnosis not present

## 2016-08-19 DIAGNOSIS — E785 Hyperlipidemia, unspecified: Secondary | ICD-10-CM | POA: Diagnosis not present

## 2016-08-19 DIAGNOSIS — G309 Alzheimer's disease, unspecified: Secondary | ICD-10-CM | POA: Diagnosis not present

## 2016-08-19 DIAGNOSIS — F0150 Vascular dementia without behavioral disturbance: Secondary | ICD-10-CM | POA: Diagnosis not present

## 2016-08-19 DIAGNOSIS — G933 Postviral fatigue syndrome: Secondary | ICD-10-CM | POA: Diagnosis not present

## 2016-08-19 DIAGNOSIS — I10 Essential (primary) hypertension: Secondary | ICD-10-CM | POA: Diagnosis not present

## 2016-08-19 DIAGNOSIS — R296 Repeated falls: Secondary | ICD-10-CM | POA: Diagnosis not present

## 2016-08-19 DIAGNOSIS — R2689 Other abnormalities of gait and mobility: Secondary | ICD-10-CM | POA: Diagnosis not present

## 2016-08-19 DIAGNOSIS — I679 Cerebrovascular disease, unspecified: Secondary | ICD-10-CM | POA: Diagnosis not present

## 2016-08-19 DIAGNOSIS — F0280 Dementia in other diseases classified elsewhere without behavioral disturbance: Secondary | ICD-10-CM | POA: Diagnosis not present

## 2016-08-20 DIAGNOSIS — G933 Postviral fatigue syndrome: Secondary | ICD-10-CM | POA: Diagnosis not present

## 2016-08-21 DIAGNOSIS — F0280 Dementia in other diseases classified elsewhere without behavioral disturbance: Secondary | ICD-10-CM | POA: Diagnosis not present

## 2016-08-21 DIAGNOSIS — G933 Postviral fatigue syndrome: Secondary | ICD-10-CM | POA: Diagnosis not present

## 2016-08-21 DIAGNOSIS — E785 Hyperlipidemia, unspecified: Secondary | ICD-10-CM | POA: Diagnosis not present

## 2016-08-21 DIAGNOSIS — G309 Alzheimer's disease, unspecified: Secondary | ICD-10-CM | POA: Diagnosis not present

## 2016-08-21 DIAGNOSIS — R296 Repeated falls: Secondary | ICD-10-CM | POA: Diagnosis not present

## 2016-08-21 DIAGNOSIS — I10 Essential (primary) hypertension: Secondary | ICD-10-CM | POA: Diagnosis not present

## 2016-08-21 DIAGNOSIS — I679 Cerebrovascular disease, unspecified: Secondary | ICD-10-CM | POA: Diagnosis not present

## 2016-08-21 DIAGNOSIS — R1310 Dysphagia, unspecified: Secondary | ICD-10-CM | POA: Diagnosis not present

## 2016-08-21 DIAGNOSIS — F0150 Vascular dementia without behavioral disturbance: Secondary | ICD-10-CM | POA: Diagnosis not present

## 2016-08-21 DIAGNOSIS — R2689 Other abnormalities of gait and mobility: Secondary | ICD-10-CM | POA: Diagnosis not present

## 2016-08-22 ENCOUNTER — Emergency Department
Admission: EM | Admit: 2016-08-22 | Discharge: 2016-08-22 | Disposition: A | Discharge status: Home / Self Care | Payer: Medicare HMO | Attending: Student in an Organized Health Care Education/Training Program | Admitting: Student in an Organized Health Care Education/Training Program

## 2016-08-22 ENCOUNTER — Emergency Department: Payer: Medicare HMO

## 2016-08-22 DIAGNOSIS — Z7401 Bed confinement status: Secondary | ICD-10-CM | POA: Diagnosis not present

## 2016-08-22 DIAGNOSIS — Z87891 Personal history of nicotine dependence: Secondary | ICD-10-CM | POA: Diagnosis not present

## 2016-08-22 DIAGNOSIS — R4020 Unspecified coma: Secondary | ICD-10-CM | POA: Diagnosis not present

## 2016-08-22 DIAGNOSIS — R4182 Altered mental status, unspecified: Secondary | ICD-10-CM | POA: Diagnosis not present

## 2016-08-22 DIAGNOSIS — Z7982 Long term (current) use of aspirin: Secondary | ICD-10-CM | POA: Insufficient documentation

## 2016-08-22 DIAGNOSIS — R410 Disorientation, unspecified: Secondary | ICD-10-CM | POA: Diagnosis not present

## 2016-08-22 DIAGNOSIS — Z79899 Other long term (current) drug therapy: Secondary | ICD-10-CM | POA: Diagnosis not present

## 2016-08-22 DIAGNOSIS — G933 Postviral fatigue syndrome: Secondary | ICD-10-CM | POA: Diagnosis not present

## 2016-08-22 DIAGNOSIS — N390 Urinary tract infection, site not specified: Secondary | ICD-10-CM | POA: Insufficient documentation

## 2016-08-22 DIAGNOSIS — I10 Essential (primary) hypertension: Secondary | ICD-10-CM | POA: Diagnosis not present

## 2016-08-22 DIAGNOSIS — R4189 Other symptoms and signs involving cognitive functions and awareness: Secondary | ICD-10-CM | POA: Diagnosis not present

## 2016-08-22 LAB — COMPREHENSIVE METABOLIC PANEL
ALBUMIN: 3.5 g/dL (ref 3.5–5.0)
ALK PHOS: 103 U/L (ref 38–126)
ALT: 20 U/L (ref 14–54)
AST: 30 U/L (ref 15–41)
Anion gap: 9 (ref 5–15)
BILIRUBIN TOTAL: 0.6 mg/dL (ref 0.3–1.2)
BUN: 16 mg/dL (ref 6–20)
CALCIUM: 8.8 mg/dL — AB (ref 8.9–10.3)
CO2: 29 mmol/L (ref 22–32)
Chloride: 101 mmol/L (ref 101–111)
Creatinine, Ser: 0.71 mg/dL (ref 0.44–1.00)
GFR calc Af Amer: >60 mL/min (ref >60)
GFR calc non Af Amer: >60 mL/min (ref >60)
GLUCOSE: 116 mg/dL — AB (ref 65–99)
Potassium: 4 mmol/L (ref 3.5–5.1)
Sodium: 139 mmol/L (ref 135–145)
Total Protein: 7.6 g/dL (ref 6.5–8.1)

## 2016-08-22 LAB — URINALYSIS, COMPLETE (UACMP) WITH MICROSCOPIC
BILIRUBIN URINE: NEGATIVE
Glucose, UA: NEGATIVE mg/dL
Hgb urine dipstick: NEGATIVE
KETONES UR: NEGATIVE mg/dL
Nitrite: POSITIVE — AB
PROTEIN: NEGATIVE mg/dL
SPECIFIC GRAVITY, URINE: 1.008 (ref 1.005–1.030)
pH: 6 (ref 5.0–8.0)

## 2016-08-22 LAB — CBC
HEMATOCRIT: 37.1 % (ref 35.0–47.0)
Hemoglobin: 12.5 g/dL (ref 12.0–16.0)
MCH: 31 pg (ref 26.0–34.0)
MCHC: 33.7 g/dL (ref 32.0–36.0)
MCV: 91.8 fL (ref 80.0–100.0)
Platelets: 294 10*3/uL (ref 150–440)
RBC: 4.04 MIL/uL (ref 3.80–5.20)
RDW: 14.2 % (ref 11.5–14.5)
WBC: 12.2 10*3/uL — ABNORMAL HIGH (ref 3.6–11.0)

## 2016-08-22 LAB — GLUCOSE, CAPILLARY: GLUCOSE-CAPILLARY: 100 mg/dL — AB (ref 65–99)

## 2016-08-22 LAB — TROPONIN I: Troponin I: 0.03 ng/mL (ref <0.03)

## 2016-08-22 MED ORDER — CEPHALEXIN 250 MG PO CAPS: 250.0000 mg | ORAL_CAPSULE | Freq: Three times a day (TID) | ORAL | 0 refills | Status: AC

## 2016-08-22 MED ORDER — DEXTROSE 5 % IV SOLN
1.0000 g | Freq: Once | INTRAVENOUS | Status: AC
  Administered 2016-08-22: 1 g via INTRAVENOUS
  Filled 2016-08-22: qty 10

## 2016-08-22 NOTE — ED Notes (Signed)
Called [facility], will send pt back by EMS after antibiotics finish.

## 2016-08-22 NOTE — ED Provider Notes (Signed)
[facility] Emergency Department Provider Note    First MD Initiated Contact with Patient 08/22/16 1206     (approximate)  I have reviewed the triage vital signs and the nursing notes.   HISTORY  Chief Complaint Altered Mental Status  Level V Caveat:  dementia  HPI Sarah [name] is a 83 y.o. female was reportedly found altered and unresponsive. Patient was spontaneously breathing. There was report of her blood pressure being elevated and her heart rate being low. No other information provided. Patient arrives sleepy and drowsy but awakens easily and denies any pain.  Further history limited 2/2 dementia   Past Medical History:  Diagnosis Date  . Dementia   . Fall   . Hyperlipidemia   . Hypertension   . Macular degeneration   . Osteoporosis    Family History  Problem Relation Age of Onset  . CVA Mother   . Dementia Father    History reviewed. No pertinent surgical history. Patient Active Problem List   Diagnosis Date Noted  . Mass of upper lobe of right lung 07/11/2016  . CVA (cerebral vascular accident) ([place]) 07/02/2016      Prior to Admission medications   Medication Sig Start Date End Date Taking? Authorizing Provider  acetaminophen (TYLENOL) 325 MG tablet Take 650 mg by mouth every 4 (four) hours as needed.   Yes [provider]  acetaminophen (TYLENOL) 500 MG tablet Take 500 mg by mouth 2 (two) times daily.   Yes [provider]  ALPRAZolam (XANAX) 0.25 MG tablet Take 0.25 mg by mouth at bedtime. 08/19/16 08/25/16 Yes [provider]  ALPRAZolam Duanne Moron) 0.5 MG tablet Take 0.5 mg by mouth at bedtime as needed for anxiety.    Yes [provider]  amLODipine (NORVASC) 10 MG tablet Take 10 mg by mouth daily.    Yes [provider]  aspirin EC 81 MG EC tablet Take 1 tablet (81 mg total) by mouth daily. 07/05/16  Yes Mody, Ulice Bold, MD  Cholecalciferol (VITAMIN D-1000 MAX ST) 1000 units tablet Take 1  tablet by mouth daily.   Yes [provider]  Cranberry 450 MG CAPS Take 450 mg by mouth daily.    Yes [provider]  donepezil (ARICEPT) 5 MG tablet Take 5 mg by mouth at bedtime.   Yes [provider]  escitalopram (LEXAPRO) 10 MG tablet Take 10 mg by mouth at bedtime.   Yes [provider]  hydrochlorothiazide (HYDRODIURIL) 25 MG tablet Take 25 mg by mouth daily.   Yes [provider]  loperamide (IMODIUM) 2 MG capsule Take 2 mg by mouth every 6 (six) hours as needed for diarrhea or loose stools.   Yes [provider]  Melatonin 5 MG TABS Take 1 tablet by mouth at bedtime.   Yes [provider]  memantine (NAMENDA) 5 MG tablet Take 5-10 mg by mouth 2 (two) times daily. Take 10 mg by mouth in the morning and 5 mg by mouth at bedtime. 08/06/16  Yes [provider]  mirtazapine (REMERON) 15 MG tablet Take 7.5 mg by mouth at bedtime. 08/13/16  Yes [provider]  Multiple Vitamins-Minerals (PRESERVISION AREDS 2) CAPS Take 1 capsule by mouth 2 (two) times daily.   Yes [provider]  ondansetron (ZOFRAN ODT) 4 MG disintegrating tablet Take 1 tablet (4 mg total) by mouth every 8 (eight) hours as needed for nausea or vomiting. 06/30/16  Yes Alfred Levins, [place], MD  potassium chloride SA (  K-DUR,KLOR-CON) 20 MEQ tablet Take 20 mEq by mouth daily.    Yes [provider]  cephALEXin (KEFLEX) 250 MG capsule Take 1 capsule (250 mg total) by mouth 3 (three) times daily. 08/22/16 08/29/16  Merlyn Lot, MD    Allergies Patient has no known allergies.    Social History Social History  Substance Use Topics  . Smoking status: Former Research scientist (life sciences)  . Smokeless tobacco: Never Used  . Alcohol use No    Review of Systems Patient denies headaches, rhinorrhea, blurry vision, numbness, shortness of breath, chest pain, edema, cough, abdominal pain, nausea, vomiting, diarrhea, dysuria, fevers, rashes or hallucinations  unless otherwise stated above in HPI. ____________________________________________   PHYSICAL EXAM:  VITAL SIGNS: Vitals:   08/22/16 1330 08/22/16 1440  BP: (!) 151/54 (!) 132/52  Pulse: 84 (!) 58  Resp: 14 14  Temp:      Constitutional: comfortable appearing, sleeping but arouses easily to voice, well appearing and in no acute distress. Eyes: Conjunctivae are normal.  Head: Atraumatic. Nose: No congestion/rhinnorhea. Mouth/Throat: Mucous membranes are moist.   Neck: No stridor. Painless ROM.  Cardiovascular: Normal rate, regular rhythm. Grossly normal heart sounds.  Good peripheral circulation. Respiratory: Normal respiratory effort.  No retractions. Lungs CTAB. Gastrointestinal: Soft and nontender. No distention. No abdominal bruits. No CVA tenderness. Musculoskeletal: No lower extremity tenderness nor edema.  No joint effusions. Neurologic:  No gross focal neurologic deficits are appreciated. No facial droop Skin:  Skin is warm, dry and intact. No rash noted.  ____________________________________________   LABS (all labs ordered are listed, but only abnormal results are displayed)  Results for orders placed or performed during the hospital encounter of 08/22/16 (from the past 24 hour(s))  Comprehensive metabolic panel     Status: Abnormal   Collection Time: 08/22/16 12:16 PM  Result Value Ref Range   Sodium 139 135 - 145 mmol/L   Potassium 4.0 3.5 - 5.1 mmol/L   Chloride 101 101 - 111 mmol/L   CO2 29 22 - 32 mmol/L   Glucose, Bld 116 (H) 65 - 99 mg/dL   BUN 16 6 - 20 mg/dL   Creatinine, Ser 0.71 0.44 - 1.00 mg/dL   Calcium 8.8 (L) 8.9 - 10.3 mg/dL   Total Protein 7.6 6.5 - 8.1 g/dL   Albumin 3.5 3.5 - 5.0 g/dL   AST 30 15 - 41 U/L   ALT 20 14 - 54 U/L   Alkaline Phosphatase 103 38 - 126 U/L   Total Bilirubin 0.6 0.3 - 1.2 mg/dL   GFR calc non Af Amer >60 >60 mL/min   GFR calc Af Amer >60 >60 mL/min   Anion gap 9 5 - 15  CBC     Status: Abnormal   Collection  Time: 08/22/16 12:16 PM  Result Value Ref Range   WBC 12.2 (H) 3.6 - 11.0 K/uL   RBC 4.04 3.80 - 5.20 MIL/uL   Hemoglobin 12.5 12.0 - 16.0 g/dL   HCT 37.1 35.0 - 47.0 %   MCV 91.8 80.0 - 100.0 fL   MCH 31.0 26.0 - 34.0 pg   MCHC 33.7 32.0 - 36.0 g/dL   RDW 14.2 11.5 - 14.5 %   Platelets 294 150 - 440 K/uL  Troponin I     Status: Abnormal   Collection Time: 08/22/16 12:16 PM  Result Value Ref Range   Troponin I 0.03 (HH) <0.03 ng/mL  Glucose, capillary     Status: Abnormal   Collection Time: 08/22/16 12:17 PM  Result Value Ref Range   Glucose-Capillary 100 (H) 65 - 99 mg/dL  Urinalysis, Complete w Microscopic     Status: Abnormal   Collection Time: 08/22/16 12:45 PM  Result Value Ref Range   Color, Urine YELLOW (A) YELLOW   APPearance HAZY (A) CLEAR   Specific Gravity, Urine 1.008 1.005 - 1.030   pH 6.0 5.0 - 8.0   Glucose, UA NEGATIVE NEGATIVE mg/dL   Hgb urine dipstick NEGATIVE NEGATIVE   Bilirubin Urine NEGATIVE NEGATIVE   Ketones, ur NEGATIVE NEGATIVE mg/dL   Protein, ur NEGATIVE NEGATIVE mg/dL   Nitrite POSITIVE (A) NEGATIVE   Leukocytes, UA MODERATE (A) NEGATIVE   RBC / HPF 0-5 0 - 5 RBC/hpf   WBC, UA TOO NUMEROUS TO COUNT 0 - 5 WBC/hpf   Bacteria, UA MANY (A) NONE SEEN   Squamous Epithelial / LPF 0-5 (A) NONE SEEN   WBC Clumps PRESENT    Mucous PRESENT    ____________________________________________  EKG My review and personal interpretation at Time: 12:11   Indication: ams  Rate: 60  Rhythm: sinus Axis: normal  Other: normal intervals, no stemi ____________________________________________  RADIOLOGY  I personally reviewed all radiographic images ordered to evaluate for the above acute complaints and reviewed radiology reports and findings.  These findings were personally discussed with the patient.  Please see medical record for radiology report.  ____________________________________________   PROCEDURES  Procedure(s) performed:   Procedures    Critical Care performed: no ____________________________________________   INITIAL IMPRESSION / ASSESSMENT AND PLAN / ED COURSE  Pertinent labs & imaging results that were available during my care of the patient were reviewed by me and considered in my medical decision making (see chart for details).  DDX: uti, dehydration, electrolye abn, dysrhythmia, pna  Sarah [name] is a 83 y.o. who presents to the ED with Above symptoms. Patient pleasant and well-appearing. History limited due to dementia. CT imaging will be ordered to evaluate for bleed. No evidence of ischemia on EKG. Abdominal exam soft and benign.  The patient will be placed on continuous pulse oximetry and telemetry for monitoring.  Laboratory evaluation will be sent to evaluate for the above complaints.     Clinical Course as of Aug 22 1520  Fri Aug 22, 2016  1328 Discussed case with patient's son medical POA at bedside. He states that he encouraged EMS the facility keep the patient at home is that she is a DO NOT RESUSCITATE and her goals of care comfort only. He states that she frequently goes in and out of simply wanting to sleep. Currently behaving normally to the patient. He has no further concerns at this time.  [PR]  1354 Patient resting comfortably. Awakens to voice.  [PR]  1430 UA with evidence of UTI.  Patient received IV rocephin.  Son updated results. We'll also place order for physical therapy initial nursing checks.  Have offered admission to hospital for further observation but the son would like her discharge back home. This is in line with her goals of care at this is reasonable.  [PR]    Clinical Course User Index [PR] Merlyn Lot, MD     ____________________________________________   FINAL CLINICAL IMPRESSION(S) / ED DIAGNOSES  Final diagnoses:  Confusion  Lower urinary tract infectious disease      NEW MEDICATIONS STARTED DURING THIS VISIT:  New Prescriptions    CEPHALEXIN (KEFLEX) 250 MG CAPSULE    Take 1 capsule (250 mg total) by mouth 3 (three) times daily.  Note:  This document was prepared using Dragon voice recognition software and may include unintentional dictation errors.    Merlyn Lot, MD 08/22/16 [phone]

## 2016-08-22 NOTE — ED Triage Notes (Signed)
Pt came to ED via EMS from [facility]. EMS called out for unresponsiveness. Pt has history of dementia, not acting her normal self. Responsive to painful stimuli. VS stable.

## 2016-08-23 DIAGNOSIS — G933 Postviral fatigue syndrome: Secondary | ICD-10-CM | POA: Diagnosis not present

## 2016-08-24 DIAGNOSIS — G933 Postviral fatigue syndrome: Secondary | ICD-10-CM | POA: Diagnosis not present

## 2016-08-25 DIAGNOSIS — R296 Repeated falls: Secondary | ICD-10-CM | POA: Diagnosis not present

## 2016-08-25 DIAGNOSIS — I679 Cerebrovascular disease, unspecified: Secondary | ICD-10-CM | POA: Diagnosis not present

## 2016-08-25 DIAGNOSIS — I10 Essential (primary) hypertension: Secondary | ICD-10-CM | POA: Diagnosis not present

## 2016-08-25 DIAGNOSIS — E785 Hyperlipidemia, unspecified: Secondary | ICD-10-CM | POA: Diagnosis not present

## 2016-08-25 DIAGNOSIS — F0150 Vascular dementia without behavioral disturbance: Secondary | ICD-10-CM | POA: Diagnosis not present

## 2016-08-25 DIAGNOSIS — F0280 Dementia in other diseases classified elsewhere without behavioral disturbance: Secondary | ICD-10-CM | POA: Diagnosis not present

## 2016-08-25 DIAGNOSIS — R2689 Other abnormalities of gait and mobility: Secondary | ICD-10-CM | POA: Diagnosis not present

## 2016-08-25 DIAGNOSIS — G309 Alzheimer's disease, unspecified: Secondary | ICD-10-CM | POA: Diagnosis not present

## 2016-08-25 DIAGNOSIS — G933 Postviral fatigue syndrome: Secondary | ICD-10-CM | POA: Diagnosis not present

## 2016-08-25 DIAGNOSIS — R1310 Dysphagia, unspecified: Secondary | ICD-10-CM | POA: Diagnosis not present

## 2016-08-26 DIAGNOSIS — N390 Urinary tract infection, site not specified: Secondary | ICD-10-CM | POA: Diagnosis not present

## 2016-08-26 DIAGNOSIS — I10 Essential (primary) hypertension: Secondary | ICD-10-CM | POA: Diagnosis not present

## 2016-08-26 DIAGNOSIS — G933 Postviral fatigue syndrome: Secondary | ICD-10-CM | POA: Diagnosis not present

## 2016-08-27 DIAGNOSIS — F0150 Vascular dementia without behavioral disturbance: Secondary | ICD-10-CM | POA: Diagnosis not present

## 2016-08-27 DIAGNOSIS — R296 Repeated falls: Secondary | ICD-10-CM | POA: Diagnosis not present

## 2016-08-27 DIAGNOSIS — R2689 Other abnormalities of gait and mobility: Secondary | ICD-10-CM | POA: Diagnosis not present

## 2016-08-27 DIAGNOSIS — R1310 Dysphagia, unspecified: Secondary | ICD-10-CM | POA: Diagnosis not present

## 2016-08-27 DIAGNOSIS — G933 Postviral fatigue syndrome: Secondary | ICD-10-CM | POA: Diagnosis not present

## 2016-08-27 DIAGNOSIS — E785 Hyperlipidemia, unspecified: Secondary | ICD-10-CM | POA: Diagnosis not present

## 2016-08-27 DIAGNOSIS — F0280 Dementia in other diseases classified elsewhere without behavioral disturbance: Secondary | ICD-10-CM | POA: Diagnosis not present

## 2016-08-27 DIAGNOSIS — G309 Alzheimer's disease, unspecified: Secondary | ICD-10-CM | POA: Diagnosis not present

## 2016-08-27 DIAGNOSIS — I679 Cerebrovascular disease, unspecified: Secondary | ICD-10-CM | POA: Diagnosis not present

## 2016-08-27 DIAGNOSIS — I10 Essential (primary) hypertension: Secondary | ICD-10-CM | POA: Diagnosis not present

## 2016-08-28 DIAGNOSIS — E785 Hyperlipidemia, unspecified: Secondary | ICD-10-CM | POA: Diagnosis not present

## 2016-08-28 DIAGNOSIS — F0280 Dementia in other diseases classified elsewhere without behavioral disturbance: Secondary | ICD-10-CM | POA: Diagnosis not present

## 2016-08-28 DIAGNOSIS — I679 Cerebrovascular disease, unspecified: Secondary | ICD-10-CM | POA: Diagnosis not present

## 2016-08-28 DIAGNOSIS — G933 Postviral fatigue syndrome: Secondary | ICD-10-CM | POA: Diagnosis not present

## 2016-08-28 DIAGNOSIS — R296 Repeated falls: Secondary | ICD-10-CM | POA: Diagnosis not present

## 2016-08-28 DIAGNOSIS — R2689 Other abnormalities of gait and mobility: Secondary | ICD-10-CM | POA: Diagnosis not present

## 2016-08-28 DIAGNOSIS — F0150 Vascular dementia without behavioral disturbance: Secondary | ICD-10-CM | POA: Diagnosis not present

## 2016-08-28 DIAGNOSIS — R1310 Dysphagia, unspecified: Secondary | ICD-10-CM | POA: Diagnosis not present

## 2016-08-28 DIAGNOSIS — I10 Essential (primary) hypertension: Secondary | ICD-10-CM | POA: Diagnosis not present

## 2016-08-28 DIAGNOSIS — G309 Alzheimer's disease, unspecified: Secondary | ICD-10-CM | POA: Diagnosis not present

## 2016-08-29 DIAGNOSIS — G933 Postviral fatigue syndrome: Secondary | ICD-10-CM | POA: Diagnosis not present

## 2016-08-30 DIAGNOSIS — G933 Postviral fatigue syndrome: Secondary | ICD-10-CM | POA: Diagnosis not present

## 2016-08-31 DIAGNOSIS — G933 Postviral fatigue syndrome: Secondary | ICD-10-CM | POA: Diagnosis not present

## 2016-09-01 DIAGNOSIS — R2689 Other abnormalities of gait and mobility: Secondary | ICD-10-CM | POA: Diagnosis not present

## 2016-09-01 DIAGNOSIS — G309 Alzheimer's disease, unspecified: Secondary | ICD-10-CM | POA: Diagnosis not present

## 2016-09-01 DIAGNOSIS — R1310 Dysphagia, unspecified: Secondary | ICD-10-CM | POA: Diagnosis not present

## 2016-09-01 DIAGNOSIS — R296 Repeated falls: Secondary | ICD-10-CM | POA: Diagnosis not present

## 2016-09-01 DIAGNOSIS — F0280 Dementia in other diseases classified elsewhere without behavioral disturbance: Secondary | ICD-10-CM | POA: Diagnosis not present

## 2016-09-01 DIAGNOSIS — F0150 Vascular dementia without behavioral disturbance: Secondary | ICD-10-CM | POA: Diagnosis not present

## 2016-09-01 DIAGNOSIS — F0151 Vascular dementia with behavioral disturbance: Secondary | ICD-10-CM | POA: Diagnosis not present

## 2016-09-01 DIAGNOSIS — E785 Hyperlipidemia, unspecified: Secondary | ICD-10-CM | POA: Diagnosis not present

## 2016-09-01 DIAGNOSIS — G933 Postviral fatigue syndrome: Secondary | ICD-10-CM | POA: Diagnosis not present

## 2016-09-01 DIAGNOSIS — I679 Cerebrovascular disease, unspecified: Secondary | ICD-10-CM | POA: Diagnosis not present

## 2016-09-01 DIAGNOSIS — I10 Essential (primary) hypertension: Secondary | ICD-10-CM | POA: Diagnosis not present

## 2016-09-02 DIAGNOSIS — E785 Hyperlipidemia, unspecified: Secondary | ICD-10-CM | POA: Diagnosis not present

## 2016-09-02 DIAGNOSIS — I679 Cerebrovascular disease, unspecified: Secondary | ICD-10-CM | POA: Diagnosis not present

## 2016-09-02 DIAGNOSIS — R296 Repeated falls: Secondary | ICD-10-CM | POA: Diagnosis not present

## 2016-09-02 DIAGNOSIS — R1310 Dysphagia, unspecified: Secondary | ICD-10-CM | POA: Diagnosis not present

## 2016-09-02 DIAGNOSIS — R2689 Other abnormalities of gait and mobility: Secondary | ICD-10-CM | POA: Diagnosis not present

## 2016-09-02 DIAGNOSIS — I10 Essential (primary) hypertension: Secondary | ICD-10-CM | POA: Diagnosis not present

## 2016-09-02 DIAGNOSIS — G309 Alzheimer's disease, unspecified: Secondary | ICD-10-CM | POA: Diagnosis not present

## 2016-09-02 DIAGNOSIS — F0150 Vascular dementia without behavioral disturbance: Secondary | ICD-10-CM | POA: Diagnosis not present

## 2016-09-02 DIAGNOSIS — G933 Postviral fatigue syndrome: Secondary | ICD-10-CM | POA: Diagnosis not present

## 2016-09-02 DIAGNOSIS — F0280 Dementia in other diseases classified elsewhere without behavioral disturbance: Secondary | ICD-10-CM | POA: Diagnosis not present

## 2016-09-03 DIAGNOSIS — G933 Postviral fatigue syndrome: Secondary | ICD-10-CM | POA: Diagnosis not present

## 2016-09-04 DIAGNOSIS — G933 Postviral fatigue syndrome: Secondary | ICD-10-CM | POA: Diagnosis not present

## 2016-09-05 DIAGNOSIS — G933 Postviral fatigue syndrome: Secondary | ICD-10-CM | POA: Diagnosis not present

## 2016-09-06 ENCOUNTER — Emergency Department
Admission: EM | Admit: 2016-09-06 | Discharge: 2016-09-06 | Disposition: A | Discharge status: Home / Self Care | Payer: Medicare HMO | Attending: Emergency Medicine | Admitting: Emergency Medicine

## 2016-09-06 ENCOUNTER — Encounter: Payer: Self-pay | Admitting: Emergency Medicine

## 2016-09-06 ENCOUNTER — Emergency Department: Payer: Medicare HMO

## 2016-09-06 DIAGNOSIS — R1310 Dysphagia, unspecified: Secondary | ICD-10-CM | POA: Diagnosis not present

## 2016-09-06 DIAGNOSIS — Y939 Activity, unspecified: Secondary | ICD-10-CM | POA: Insufficient documentation

## 2016-09-06 DIAGNOSIS — Z87891 Personal history of nicotine dependence: Secondary | ICD-10-CM | POA: Insufficient documentation

## 2016-09-06 DIAGNOSIS — R2689 Other abnormalities of gait and mobility: Secondary | ICD-10-CM | POA: Diagnosis not present

## 2016-09-06 DIAGNOSIS — Z8673 Personal history of transient ischemic attack (TIA), and cerebral infarction without residual deficits: Secondary | ICD-10-CM | POA: Diagnosis not present

## 2016-09-06 DIAGNOSIS — Y929 Unspecified place or not applicable: Secondary | ICD-10-CM | POA: Diagnosis not present

## 2016-09-06 DIAGNOSIS — S9030XA Contusion of unspecified foot, initial encounter: Secondary | ICD-10-CM

## 2016-09-06 DIAGNOSIS — X58XXXA Exposure to other specified factors, initial encounter: Secondary | ICD-10-CM | POA: Insufficient documentation

## 2016-09-06 DIAGNOSIS — F0280 Dementia in other diseases classified elsewhere without behavioral disturbance: Secondary | ICD-10-CM | POA: Diagnosis not present

## 2016-09-06 DIAGNOSIS — Y999 Unspecified external cause status: Secondary | ICD-10-CM | POA: Diagnosis not present

## 2016-09-06 DIAGNOSIS — Z7982 Long term (current) use of aspirin: Secondary | ICD-10-CM | POA: Insufficient documentation

## 2016-09-06 DIAGNOSIS — Z79899 Other long term (current) drug therapy: Secondary | ICD-10-CM | POA: Diagnosis not present

## 2016-09-06 DIAGNOSIS — G309 Alzheimer's disease, unspecified: Secondary | ICD-10-CM | POA: Diagnosis not present

## 2016-09-06 DIAGNOSIS — F0390 Unspecified dementia without behavioral disturbance: Secondary | ICD-10-CM | POA: Insufficient documentation

## 2016-09-06 DIAGNOSIS — F0150 Vascular dementia without behavioral disturbance: Secondary | ICD-10-CM | POA: Diagnosis not present

## 2016-09-06 DIAGNOSIS — G933 Postviral fatigue syndrome: Secondary | ICD-10-CM | POA: Diagnosis not present

## 2016-09-06 DIAGNOSIS — S9032XA Contusion of left foot, initial encounter: Secondary | ICD-10-CM | POA: Insufficient documentation

## 2016-09-06 DIAGNOSIS — I679 Cerebrovascular disease, unspecified: Secondary | ICD-10-CM | POA: Diagnosis not present

## 2016-09-06 DIAGNOSIS — I10 Essential (primary) hypertension: Secondary | ICD-10-CM | POA: Insufficient documentation

## 2016-09-06 DIAGNOSIS — E785 Hyperlipidemia, unspecified: Secondary | ICD-10-CM | POA: Diagnosis not present

## 2016-09-06 DIAGNOSIS — M79672 Pain in left foot: Secondary | ICD-10-CM | POA: Diagnosis not present

## 2016-09-06 DIAGNOSIS — R296 Repeated falls: Secondary | ICD-10-CM | POA: Diagnosis not present

## 2016-09-06 NOTE — ED Triage Notes (Signed)
Pt arrived via EMS from [facility] [place] [address].  Per staff to EMS, pt was sitting in wheelchair and suffered a traumatic left foot injury by unknown mechanism.  Bruising and swelling noted to left foot. Pt has hx of Alzheimer's and currently does not c/o pain.  Abrasion also noted on left elbow secured with a bandaid.

## 2016-09-06 NOTE — ED Notes (Signed)
Able to get in contact with [facility], pt will be transported back via POV with staff member

## 2016-09-06 NOTE — ED Notes (Addendum)
Attempted to call report  to [facility] kept getting voicemail. Pt will go back via EMS.

## 2016-09-06 NOTE — ED Notes (Signed)
Pt resting quietly on stretcher with eyes closed, respirations equal and unlabored.

## 2016-09-06 NOTE — Discharge Instructions (Signed)
You were evaluated for bruising in the foot, and no broken bones were found.  Return to the emergency room immediately for any worsening condition including numbness, tingling, redness or uncontrolled pain.

## 2016-09-06 NOTE — ED Provider Notes (Signed)
[facility] Emergency Department Provider Note ____________________________________________   I have reviewed the triage vital signs and the triage nursing note.  HISTORY  Chief Complaint Foot Injury   Historian Limited history from patient due to dementia Nursing report  HPI Sarah [name] is a 83 y.o. female brought in for evaluation of bruising to the left foot. Unknown traumatic injury.  No reported pain.  Pain seems to be minimal. Nothing seems to make a better.    Past Medical History:  Diagnosis Date  . Dementia   . Fall   . Hyperlipidemia   . Hypertension   . Macular degeneration   . Osteoporosis     Patient Active Problem List   Diagnosis Date Noted  . Mass of upper lobe of right lung 07/11/2016  . CVA (cerebral vascular accident) ([place]) 07/02/2016    History reviewed. No pertinent surgical history.  Prior to Admission medications   Medication Sig Start Date End Date Taking? Authorizing Provider  acetaminophen (TYLENOL) 325 MG tablet Take 650 mg by mouth every 4 (four) hours as needed.   Yes [provider]  acetaminophen (TYLENOL) 500 MG tablet Take 500 mg by mouth 2 (two) times daily.   Yes [provider]  amLODipine (NORVASC) 10 MG tablet Take 10 mg by mouth daily.    Yes [provider]  aspirin EC 81 MG EC tablet Take 1 tablet (81 mg total) by mouth daily. 07/05/16  Yes Mody, Ulice Bold, MD  Cholecalciferol (VITAMIN D-1000 MAX ST) 1000 units tablet Take 1 tablet by mouth daily.   Yes [provider]  Cranberry 450 MG CAPS Take 450 mg by mouth daily.    Yes [provider]  escitalopram (LEXAPRO) 10 MG tablet Take 10 mg by mouth at bedtime.   Yes [provider]  hydrochlorothiazide (HYDRODIURIL) 25 MG tablet Take 25 mg by mouth daily.   Yes [provider]  loperamide (IMODIUM) 2 MG capsule Take 2 mg by mouth every 6 (six) hours as needed for diarrhea or loose stools.   Yes  [provider]  Melatonin 5 MG TABS Take 1 tablet by mouth at bedtime.   Yes [provider]  memantine (NAMENDA) 10 MG tablet Take 10 mg by mouth 2 (two) times daily.  08/06/16  Yes [provider]  mirtazapine (REMERON) 15 MG tablet Take 7.5 mg by mouth at bedtime. 08/13/16  Yes [provider]  Multiple Vitamins-Minerals (PRESERVISION AREDS 2) CAPS Take 1 capsule by mouth 2 (two) times daily.   Yes [provider]  ondansetron (ZOFRAN ODT) 4 MG disintegrating tablet Take 1 tablet (4 mg total) by mouth every 8 (eight) hours as needed for nausea or vomiting. Patient taking differently: Take 4 mg by mouth every 6 (six) hours as needed for nausea or vomiting.  06/30/16  Yes Alfred Levins, [place], MD  potassium chloride SA (K-DUR,KLOR-CON) 20 MEQ tablet Take 20 mEq by mouth daily.    Yes [provider]    No Known Allergies  Family History  Problem Relation Age of Onset  . CVA Mother   . Dementia Father     Social History Social History  Substance Use Topics  . Smoking status: Former Research scientist (life sciences)  . Smokeless tobacco: Never Used  . Alcohol use No    Review of Systems    ____________________________________________   PHYSICAL EXAM:  VITAL SIGNS: ED Triage Vitals  Enc Vitals Group     BP 09/06/16 1310 (!) 149/51  Pulse Rate 09/06/16 1310 (!) 56     Resp 09/06/16 1310 18     Temp 09/06/16 1310 98.5 F (36.9 C)     Temp Source 09/06/16 1310 Oral     SpO2 09/06/16 1309 96 %     Weight 09/06/16 1311 149 lb (67.6 kg)     Height 09/06/16 1311 5\' 7"  (1.702 m)     Head Circumference --      Peak Flow --      Pain Score --      Pain Loc --      Pain Edu? --      Excl. in [place]? --      Constitutional: Alert But disoriented. Well appearing and in no distress. HEENT   Head: Normocephalic and atraumatic.      Eyes: Conjunctivae are normal. Pupils equal and round.       Ears:         Nose: No congestion/rhinnorhea.    Mouth/Throat: Mucous membranes are moist.   Neck: No stridor. Cardiovascular/Chest: Normal rate, regular rhythm.  No murmurs, rubs, or gallops. Respiratory: Normal respiratory effort without tachypnea nor retractions. Breath sounds are clear and equal bilaterally. No wheezes/rales/rhonchi. Gastrointestinal: Soft. No distention, no guarding, no rebound. Nontender.    Genitourinary/rectal:Deferred Musculoskeletal: Nontender with normal range of motion in all extremities. Ecchymosis to the left foot across the top. Neurologic: Minimal speech, mostly yes and no. No facial droop. No gross or focal neurologic deficits are appreciated. Skin:  Skin is warm, dry and intact. No rash noted. Psychiatric: No agitation.   ____________________________________________  LABS (pertinent positives/negatives)  Labs Reviewed - No data to display  ____________________________________________    EKG I, Lisa Roca, MD, the attending physician have personally viewed and interpreted all ECGs.  None ____________________________________________  RADIOLOGY All Xrays were viewed by me. Imaging interpreted by Radiologist.  Left foot complete:  IMPRESSION: No acute findings.  Mild degenerative changes and postsurgical change as described. __________________________________________  PROCEDURES  Procedure(s) performed: None  Critical Care performed: None  ____________________________________________   ED COURSE / ASSESSMENT AND PLAN  Pertinent labs & imaging results that were available during my care of the patient were reviewed by me and considered in my medical decision making (see chart for details).    Unknown traumatic event, nursing home noticed left bruised foot. X-ray shows no acute fracture. Suspect bruise versus sprain.  Neurovascularly intact.  Okay for discharge and outpatient management.    CONSULTATIONS:   None  Patient / Family / Caregiver informed of clinical course,  medical decision-making process, and agree with plan.   I discussed return precautions, follow-up instructions, and discharge instructions with patient and/or family.   ___________________________________________   FINAL CLINICAL IMPRESSION(S) / ED DIAGNOSES   Final diagnoses:  Contusion of foot, unspecified laterality, initial encounter              Note: This dictation was prepared with Dragon dictation. Any transcriptional errors that result from this process are unintentional    Lisa Roca, MD 09/06/16 1553

## 2016-09-06 NOTE — ED Notes (Signed)
Pt resting with eyes closed, respirations equal and unlabored, awaiting status update from MD.

## 2016-09-07 DIAGNOSIS — G933 Postviral fatigue syndrome: Secondary | ICD-10-CM | POA: Diagnosis not present

## 2016-09-08 DIAGNOSIS — E785 Hyperlipidemia, unspecified: Secondary | ICD-10-CM | POA: Diagnosis not present

## 2016-09-08 DIAGNOSIS — G933 Postviral fatigue syndrome: Secondary | ICD-10-CM | POA: Diagnosis not present

## 2016-09-08 DIAGNOSIS — R296 Repeated falls: Secondary | ICD-10-CM | POA: Diagnosis not present

## 2016-09-08 DIAGNOSIS — G309 Alzheimer's disease, unspecified: Secondary | ICD-10-CM | POA: Diagnosis not present

## 2016-09-08 DIAGNOSIS — R1310 Dysphagia, unspecified: Secondary | ICD-10-CM | POA: Diagnosis not present

## 2016-09-08 DIAGNOSIS — I10 Essential (primary) hypertension: Secondary | ICD-10-CM | POA: Diagnosis not present

## 2016-09-08 DIAGNOSIS — F0150 Vascular dementia without behavioral disturbance: Secondary | ICD-10-CM | POA: Diagnosis not present

## 2016-09-08 DIAGNOSIS — F419 Anxiety disorder, unspecified: Secondary | ICD-10-CM | POA: Diagnosis not present

## 2016-09-08 DIAGNOSIS — F0280 Dementia in other diseases classified elsewhere without behavioral disturbance: Secondary | ICD-10-CM | POA: Diagnosis not present

## 2016-09-08 DIAGNOSIS — I679 Cerebrovascular disease, unspecified: Secondary | ICD-10-CM | POA: Diagnosis not present

## 2016-09-08 DIAGNOSIS — R2689 Other abnormalities of gait and mobility: Secondary | ICD-10-CM | POA: Diagnosis not present

## 2016-09-09 DIAGNOSIS — F0280 Dementia in other diseases classified elsewhere without behavioral disturbance: Secondary | ICD-10-CM | POA: Diagnosis not present

## 2016-09-09 DIAGNOSIS — F0150 Vascular dementia without behavioral disturbance: Secondary | ICD-10-CM | POA: Diagnosis not present

## 2016-09-09 DIAGNOSIS — G309 Alzheimer's disease, unspecified: Secondary | ICD-10-CM | POA: Diagnosis not present

## 2016-09-09 DIAGNOSIS — S9002XA Contusion of left ankle, initial encounter: Secondary | ICD-10-CM | POA: Diagnosis not present

## 2016-09-09 DIAGNOSIS — I679 Cerebrovascular disease, unspecified: Secondary | ICD-10-CM | POA: Diagnosis not present

## 2016-09-09 DIAGNOSIS — E785 Hyperlipidemia, unspecified: Secondary | ICD-10-CM | POA: Diagnosis not present

## 2016-09-09 DIAGNOSIS — I10 Essential (primary) hypertension: Secondary | ICD-10-CM | POA: Diagnosis not present

## 2016-09-09 DIAGNOSIS — R296 Repeated falls: Secondary | ICD-10-CM | POA: Diagnosis not present

## 2016-09-09 DIAGNOSIS — R2689 Other abnormalities of gait and mobility: Secondary | ICD-10-CM | POA: Diagnosis not present

## 2016-09-09 DIAGNOSIS — G933 Postviral fatigue syndrome: Secondary | ICD-10-CM | POA: Diagnosis not present

## 2016-09-09 DIAGNOSIS — R1310 Dysphagia, unspecified: Secondary | ICD-10-CM | POA: Diagnosis not present

## 2016-09-10 DIAGNOSIS — G309 Alzheimer's disease, unspecified: Secondary | ICD-10-CM | POA: Diagnosis not present

## 2016-09-10 DIAGNOSIS — I10 Essential (primary) hypertension: Secondary | ICD-10-CM | POA: Diagnosis not present

## 2016-09-10 DIAGNOSIS — E785 Hyperlipidemia, unspecified: Secondary | ICD-10-CM | POA: Diagnosis not present

## 2016-09-10 DIAGNOSIS — F0150 Vascular dementia without behavioral disturbance: Secondary | ICD-10-CM | POA: Diagnosis not present

## 2016-09-10 DIAGNOSIS — F0390 Unspecified dementia without behavioral disturbance: Secondary | ICD-10-CM | POA: Diagnosis not present

## 2016-09-10 DIAGNOSIS — R296 Repeated falls: Secondary | ICD-10-CM | POA: Diagnosis not present

## 2016-09-10 DIAGNOSIS — R1310 Dysphagia, unspecified: Secondary | ICD-10-CM | POA: Diagnosis not present

## 2016-09-10 DIAGNOSIS — R2689 Other abnormalities of gait and mobility: Secondary | ICD-10-CM | POA: Diagnosis not present

## 2016-09-10 DIAGNOSIS — G933 Postviral fatigue syndrome: Secondary | ICD-10-CM | POA: Diagnosis not present

## 2016-09-10 DIAGNOSIS — I679 Cerebrovascular disease, unspecified: Secondary | ICD-10-CM | POA: Diagnosis not present

## 2016-09-10 DIAGNOSIS — R32 Unspecified urinary incontinence: Secondary | ICD-10-CM | POA: Diagnosis not present

## 2016-09-10 DIAGNOSIS — F0280 Dementia in other diseases classified elsewhere without behavioral disturbance: Secondary | ICD-10-CM | POA: Diagnosis not present

## 2016-09-11 DIAGNOSIS — E785 Hyperlipidemia, unspecified: Secondary | ICD-10-CM | POA: Diagnosis not present

## 2016-09-11 DIAGNOSIS — F419 Anxiety disorder, unspecified: Secondary | ICD-10-CM | POA: Diagnosis not present

## 2016-09-11 DIAGNOSIS — I679 Cerebrovascular disease, unspecified: Secondary | ICD-10-CM | POA: Diagnosis not present

## 2016-09-11 DIAGNOSIS — F0150 Vascular dementia without behavioral disturbance: Secondary | ICD-10-CM | POA: Diagnosis not present

## 2016-09-11 DIAGNOSIS — R296 Repeated falls: Secondary | ICD-10-CM | POA: Diagnosis not present

## 2016-09-11 DIAGNOSIS — I10 Essential (primary) hypertension: Secondary | ICD-10-CM | POA: Diagnosis not present

## 2016-09-11 DIAGNOSIS — F0280 Dementia in other diseases classified elsewhere without behavioral disturbance: Secondary | ICD-10-CM | POA: Diagnosis not present

## 2016-09-11 DIAGNOSIS — R1310 Dysphagia, unspecified: Secondary | ICD-10-CM | POA: Diagnosis not present

## 2016-09-11 DIAGNOSIS — G309 Alzheimer's disease, unspecified: Secondary | ICD-10-CM | POA: Diagnosis not present

## 2016-09-11 DIAGNOSIS — R2689 Other abnormalities of gait and mobility: Secondary | ICD-10-CM | POA: Diagnosis not present

## 2016-09-11 DIAGNOSIS — G933 Postviral fatigue syndrome: Secondary | ICD-10-CM | POA: Diagnosis not present

## 2016-09-11 DIAGNOSIS — F0151 Vascular dementia with behavioral disturbance: Secondary | ICD-10-CM | POA: Diagnosis not present

## 2016-09-12 DIAGNOSIS — R1310 Dysphagia, unspecified: Secondary | ICD-10-CM | POA: Diagnosis not present

## 2016-09-12 DIAGNOSIS — G933 Postviral fatigue syndrome: Secondary | ICD-10-CM | POA: Diagnosis not present

## 2016-09-12 DIAGNOSIS — R296 Repeated falls: Secondary | ICD-10-CM | POA: Diagnosis not present

## 2016-09-12 DIAGNOSIS — F0280 Dementia in other diseases classified elsewhere without behavioral disturbance: Secondary | ICD-10-CM | POA: Diagnosis not present

## 2016-09-12 DIAGNOSIS — G309 Alzheimer's disease, unspecified: Secondary | ICD-10-CM | POA: Diagnosis not present

## 2016-09-12 DIAGNOSIS — I10 Essential (primary) hypertension: Secondary | ICD-10-CM | POA: Diagnosis not present

## 2016-09-12 DIAGNOSIS — R2689 Other abnormalities of gait and mobility: Secondary | ICD-10-CM | POA: Diagnosis not present

## 2016-09-12 DIAGNOSIS — I679 Cerebrovascular disease, unspecified: Secondary | ICD-10-CM | POA: Diagnosis not present

## 2016-09-12 DIAGNOSIS — E785 Hyperlipidemia, unspecified: Secondary | ICD-10-CM | POA: Diagnosis not present

## 2016-09-12 DIAGNOSIS — F0150 Vascular dementia without behavioral disturbance: Secondary | ICD-10-CM | POA: Diagnosis not present

## 2016-09-13 DIAGNOSIS — G933 Postviral fatigue syndrome: Secondary | ICD-10-CM | POA: Diagnosis not present

## 2016-09-14 ENCOUNTER — Emergency Department
Admission: EM | Admit: 2016-09-14 | Discharge: 2016-09-14 | Disposition: A | Discharge status: Home / Self Care | Payer: Medicare HMO | Attending: Emergency Medicine | Admitting: Emergency Medicine

## 2016-09-14 ENCOUNTER — Emergency Department: Payer: Medicare HMO

## 2016-09-14 ENCOUNTER — Encounter: Payer: Self-pay | Admitting: Emergency Medicine

## 2016-09-14 DIAGNOSIS — Z7982 Long term (current) use of aspirin: Secondary | ICD-10-CM | POA: Diagnosis not present

## 2016-09-14 DIAGNOSIS — Z79899 Other long term (current) drug therapy: Secondary | ICD-10-CM | POA: Insufficient documentation

## 2016-09-14 DIAGNOSIS — Y92193 Bedroom in other specified residential institution as the place of occurrence of the external cause: Secondary | ICD-10-CM | POA: Diagnosis not present

## 2016-09-14 DIAGNOSIS — Y9384 Activity, sleeping: Secondary | ICD-10-CM | POA: Diagnosis not present

## 2016-09-14 DIAGNOSIS — F0390 Unspecified dementia without behavioral disturbance: Secondary | ICD-10-CM | POA: Diagnosis not present

## 2016-09-14 DIAGNOSIS — S1121XA Laceration without foreign body of pharynx and cervical esophagus, initial encounter: Secondary | ICD-10-CM | POA: Diagnosis not present

## 2016-09-14 DIAGNOSIS — S0101XA Laceration without foreign body of scalp, initial encounter: Secondary | ICD-10-CM | POA: Diagnosis not present

## 2016-09-14 DIAGNOSIS — I10 Essential (primary) hypertension: Secondary | ICD-10-CM | POA: Diagnosis not present

## 2016-09-14 DIAGNOSIS — W06XXXA Fall from bed, initial encounter: Secondary | ICD-10-CM | POA: Diagnosis not present

## 2016-09-14 DIAGNOSIS — S6992XA Unspecified injury of left wrist, hand and finger(s), initial encounter: Secondary | ICD-10-CM | POA: Diagnosis not present

## 2016-09-14 DIAGNOSIS — I639 Cerebral infarction, unspecified: Secondary | ICD-10-CM | POA: Insufficient documentation

## 2016-09-14 DIAGNOSIS — S0990XA Unspecified injury of head, initial encounter: Secondary | ICD-10-CM

## 2016-09-14 DIAGNOSIS — Z87891 Personal history of nicotine dependence: Secondary | ICD-10-CM | POA: Insufficient documentation

## 2016-09-14 DIAGNOSIS — Y998 Other external cause status: Secondary | ICD-10-CM | POA: Insufficient documentation

## 2016-09-14 DIAGNOSIS — S4992XA Unspecified injury of left shoulder and upper arm, initial encounter: Secondary | ICD-10-CM | POA: Diagnosis not present

## 2016-09-14 DIAGNOSIS — G933 Postviral fatigue syndrome: Secondary | ICD-10-CM | POA: Diagnosis not present

## 2016-09-14 MED ORDER — BACITRACIN ZINC 500 UNIT/GM EX OINT: TOPICAL_OINTMENT | CUTANEOUS | 0 refills | Status: AC

## 2016-09-14 MED ORDER — LIDOCAINE-EPINEPHRINE-TETRACAINE (LET) SOLUTION
3.0000 mL | Freq: Once | NASAL | Status: AC
  Administered 2016-09-14: 3 mL via TOPICAL
  Filled 2016-09-14: qty 3

## 2016-09-14 MED ORDER — BACITRACIN ZINC 500 UNIT/GM EX OINT
TOPICAL_OINTMENT | Freq: Once | CUTANEOUS | Status: AC
  Administered 2016-09-14: 08:00:00 via TOPICAL
  Filled 2016-09-14: qty 0.9

## 2016-09-14 NOTE — Discharge Instructions (Signed)
Please have staples removed in 7-10 days

## 2016-09-14 NOTE — ED Notes (Signed)
Pt's head wound irrigated and cleaned using sterile water and 4x4 guaze; LET solution applied to the laceration on the head; MD notified.

## 2016-09-14 NOTE — ED Triage Notes (Signed)
Pt presents to ED 19 via EMS from [place] for an unwitnessed fall out of the bed and hitting top of her head on the corner of the dresser with laceration with bleeding controlled. Pt has history of dementia, alzheimer; has no known allergies and did come with a DNR form that is placed in her chart. Pt is awake, alert and oriented x 1, oriented only to herself.

## 2016-09-14 NOTE — ED Notes (Signed)
Pt's caregiver verbalized understanding of discharge instructions. NAD at this time.

## 2016-09-14 NOTE — ED Provider Notes (Signed)
[facility] Emergency Department Provider Note   ____________________________________________   First MD Initiated Contact with Patient 09/14/16 [phone]     (approximate)  I have reviewed the triage vital signs and the nursing notes.   HISTORY  Chief Complaint Fall  History is limited due to patient dementia  HPI Sarah [name] is a 83 y.o. female whocomes into the hospital today after rolling out of bed. She hit her head on a nightstand and obtained a laceration. The patient does not recall rolling out of bed and does not know she is in the emergency department today. The patient does have a history of Alzheimer's dementia.   Past Medical History:  Diagnosis Date  . Dementia   . Fall   . Hyperlipidemia   . Hypertension   . Macular degeneration   . Osteoporosis     Patient Active Problem List   Diagnosis Date Noted  . Mass of upper lobe of right lung 07/11/2016  . CVA (cerebral vascular accident) ([place]) 07/02/2016    History reviewed. No pertinent surgical history.  Prior to Admission medications   Medication Sig Start Date End Date Taking? Authorizing Provider  acetaminophen (TYLENOL) 325 MG tablet Take 650 mg by mouth every 4 (four) hours as needed for mild pain, fever or headache.    Yes [provider]  acetaminophen (TYLENOL) 500 MG tablet Take 500 mg by mouth 2 (two) times daily.   Yes [provider]  amLODipine (NORVASC) 10 MG tablet Take 10 mg by mouth daily.    Yes [provider]  aspirin EC 81 MG EC tablet Take 1 tablet (81 mg total) by mouth daily. 07/05/16  Yes Mody, Ulice Bold, MD  Cholecalciferol (VITAMIN D-1000 MAX ST) 1000 units tablet Take 1 tablet by mouth daily.   Yes [provider]  Cranberry 450 MG CAPS Take 450 mg by mouth daily.    Yes [provider]  donepezil (ARICEPT) 5 MG tablet Take 5 mg by mouth at bedtime.   Yes [provider]  escitalopram (LEXAPRO) 10 MG tablet  Take 10 mg by mouth at bedtime.   Yes [provider]  hydrochlorothiazide (HYDRODIURIL) 25 MG tablet Take 25 mg by mouth daily.   Yes [provider]  loperamide (IMODIUM) 2 MG capsule Take 2 mg by mouth every 6 (six) hours as needed for diarrhea or loose stools.   Yes [provider]  Melatonin 5 MG TABS Take 1 tablet by mouth at bedtime.   Yes [provider]  memantine (NAMENDA) 10 MG tablet Take 10 mg by mouth 2 (two) times daily.  08/06/16  Yes [provider]  methimazole (TAPAZOLE) 10 MG tablet Take 10 mg by mouth daily.   Yes [provider]  mirtazapine (REMERON) 15 MG tablet Take 7.5 mg by mouth at bedtime. 08/13/16  Yes [provider]  Multiple Vitamins-Minerals (PRESERVISION AREDS 2) CAPS Take 1 capsule by mouth 2 (two) times daily.   Yes [provider]  ondansetron (ZOFRAN ODT) 4 MG disintegrating tablet Take 1 tablet (4 mg total) by mouth every 8 (eight) hours as needed for nausea or vomiting. Patient taking differently: Take 4 mg by mouth every 6 (six) hours as needed for nausea or vomiting.  06/30/16  Yes Alfred Levins, [place], MD  potassium chloride SA (K-DUR,KLOR-CON) 20 MEQ tablet Take 20 mEq by mouth daily.    Yes [provider]  risperiDONE (RISPERDAL) 0.25 MG tablet Take 0.25 mg by mouth  2 (two) times daily.   Yes [provider]  bacitracin ointment Apply to affected area daily 09/14/16 09/14/17  Loney Hering, MD    Allergies Patient has no known allergies.  Family History  Problem Relation Age of Onset  . CVA Mother   . Dementia Father     Social History Social History  Substance Use Topics  . Smoking status: Former Research scientist (life sciences)  . Smokeless tobacco: Never Used  . Alcohol use No    Review of Systems Unable to assess due to patient dementia  ____________________________________________   PHYSICAL EXAM:  VITAL SIGNS: ED Triage Vitals  Enc Vitals Group     BP 09/14/16  0530 154/56     Pulse 09/14/16 0530 69     Resp 09/14/16 0530 19     Temp --      Temp src --      SpO2 09/14/16 0505 98 %     Weight 09/14/16 0507 150 lb (68 kg)     Height 09/14/16 0507 5\' 7"  (1.702 m)     Head Circumference --      Peak Flow --      Pain Score --      Pain Loc --      Pain Edu? --      Excl. in [place]? --     Constitutional: Alert and non oriented. Well appearing and in mild distress. Eyes: Conjunctivae are normal. PERRL. EOMI. Head: Atraumatic. Nose: No congestion/rhinnorhea. Mouth/Throat: Mucous membranes are moist.  Oropharynx non-erythematous. Cardiovascular: Normal rate, regular rhythm. Grossly normal heart sounds.  Good peripheral circulation. Respiratory: Normal respiratory effort.  No retractions. Lungs CTAB. Gastrointestinal: Soft and nontender. No distention. Positive bowel sounds Musculoskeletal: No lower extremity tenderness nor edema.   Neurologic:  Normal speech and language.  Skin:  Skin is warm, dry and laceration to left parietal scalp, bruise to left hand and left shoulder Psychiatric: Mood and affect are normal.   ____________________________________________   LABS (all labs ordered are listed, but only abnormal results are displayed)  Labs Reviewed - No data to display ____________________________________________  EKG  ED ECG REPORT I, Loney Hering, the attending physician, personally viewed and interpreted this ECG.   Date: 09/14/2016  EKG Time: 0511  Rate: 69  Rhythm: normal sinus rhythm  Axis: normal  Intervals:none  ST&T Change: none  ____________________________________________  RADIOLOGY  Ct Head Wo Contrast  Result Date: 09/14/2016 CLINICAL DATA:  83 y/o F; unwitnessed witnessed fall with laceration to the top of the head. EXAM: CT HEAD WITHOUT CONTRAST CT CERVICAL SPINE WITHOUT CONTRAST TECHNIQUE: Multidetector CT imaging of the head and cervical spine was performed following the standard protocol without  intravenous contrast. Multiplanar CT image reconstructions of the cervical spine were also generated. COMPARISON:  08/22/2016 CT head.  07/02/2016 CT cervical spine. FINDINGS: CT HEAD FINDINGS Brain: No evidence of acute infarction, hemorrhage, hydrocephalus, extra-axial collection or mass lesion/mass effect. Stable advanced chronic microvascular ischemic changes of the brain and parenchymal volume loss. Stable chronic infarct of left occipital lobe. Vascular: Extensive calcific atherosclerosis of carotid siphons. Skull: Laceration in the left high parietal region of the scalp. No displaced calvarial fracture. Sinuses/Orbits: No acute finding. Bilateral intra-ocular lens replacement. Other: Multiple dermal nodules at the vertex of the scalp, direct visualization recommended. CT CERVICAL SPINE FINDINGS Alignment: Stable trace C4-5 and C7-T1 anterolisthesis. Skull base and vertebrae: No acute fracture. No primary bone lesion or focal pathologic process. Soft tissues and spinal canal: Multinodular  thyroid goiter. Disc levels: Moderate cervical spondylosis with discogenic degenerative changes greatest at the C5-C7 levels. Mild facet arthrosis. No high-grade bony canal stenosis. Upper chest: Right upper lobe ground-glass mass in the apical segment is stable from prior CT of the cervical spine. Other: Negative. IMPRESSION: CT head: 1. Laceration to the left high parietal scalp. No displaced calvarial fracture. 2. No acute intracranial abnormality. 3. Stable advance chronic microvascular ischemic changes and advanced parenchymal volume loss of the brain. Chronic left occipital infarct. CT cervical spine: 1. No acute fracture or dislocation. 2. Right upper lobe ground-glass mass stable from prior CT of cervical spine. Given persistence this is highly suspicious for neoplasm. 3. Moderate cervical degenerative changes greatest at the C5-C7 levels. Electronically Signed   By: Kristine Garbe M.D.   On: 09/14/2016  06:18   Ct Cervical Spine Wo Contrast  Result Date: 09/14/2016 CLINICAL DATA:  83 y/o F; unwitnessed witnessed fall with laceration to the top of the head. EXAM: CT HEAD WITHOUT CONTRAST CT CERVICAL SPINE WITHOUT CONTRAST TECHNIQUE: Multidetector CT imaging of the head and cervical spine was performed following the standard protocol without intravenous contrast. Multiplanar CT image reconstructions of the cervical spine were also generated. COMPARISON:  08/22/2016 CT head.  07/02/2016 CT cervical spine. FINDINGS: CT HEAD FINDINGS Brain: No evidence of acute infarction, hemorrhage, hydrocephalus, extra-axial collection or mass lesion/mass effect. Stable advanced chronic microvascular ischemic changes of the brain and parenchymal volume loss. Stable chronic infarct of left occipital lobe. Vascular: Extensive calcific atherosclerosis of carotid siphons. Skull: Laceration in the left high parietal region of the scalp. No displaced calvarial fracture. Sinuses/Orbits: No acute finding. Bilateral intra-ocular lens replacement. Other: Multiple dermal nodules at the vertex of the scalp, direct visualization recommended. CT CERVICAL SPINE FINDINGS Alignment: Stable trace C4-5 and C7-T1 anterolisthesis. Skull base and vertebrae: No acute fracture. No primary bone lesion or focal pathologic process. Soft tissues and spinal canal: Multinodular thyroid goiter. Disc levels: Moderate cervical spondylosis with discogenic degenerative changes greatest at the C5-C7 levels. Mild facet arthrosis. No high-grade bony canal stenosis. Upper chest: Right upper lobe ground-glass mass in the apical segment is stable from prior CT of the cervical spine. Other: Negative. IMPRESSION: CT head: 1. Laceration to the left high parietal scalp. No displaced calvarial fracture. 2. No acute intracranial abnormality. 3. Stable advance chronic microvascular ischemic changes and advanced parenchymal volume loss of the brain. Chronic left occipital  infarct. CT cervical spine: 1. No acute fracture or dislocation. 2. Right upper lobe ground-glass mass stable from prior CT of cervical spine. Given persistence this is highly suspicious for neoplasm. 3. Moderate cervical degenerative changes greatest at the C5-C7 levels. Electronically Signed   By: Kristine Garbe M.D.   On: 09/14/2016 06:18   Dg Shoulder Left  Result Date: 09/14/2016 CLINICAL DATA:  . EXAM: LEFT SHOULDER - 2+ VIEW COMPARISON:  None. FINDINGS: Negative for acute fracture or dislocation. No bone lesion or bony destruction. No acute soft tissue abnormality. IMPRESSION: Negative. Electronically Signed   By: Andreas Newport M.D.   On: 09/14/2016 06:23   Dg Hand Complete Left  Result Date: 09/14/2016 CLINICAL DATA:  Unwitnessed fall EXAM: LEFT HAND - COMPLETE 3+ VIEW COMPARISON:  None. FINDINGS: Negative for acute fracture or dislocation. Severe arthritic changes involving all interphalangeal joints. No bone lesion or bony destruction. No acute soft tissue abnormality. IMPRESSION: Negative for acute fracture, dislocation or radiopaque foreign body. Electronically Signed   By: Andreas Newport M.D.   On: 09/14/2016  06:24    ____________________________________________   PROCEDURES  Procedure(s) performed: please, see procedure note(s).  Marland Kitchen.Laceration Repair Date/Time: 09/14/2016 7:10 AM Performed by: Loney Hering Authorized by: Loney Hering   Consent:    Consent obtained:  Emergent situation Anesthesia (see MAR for exact dosages):    Anesthesia method:  Topical application   Topical anesthetic:  LET Laceration details:    Location:  Scalp   Scalp location:  L parietal   Length (cm):  4.5 Repair type:    Repair type:  Simple Exploration:    Contaminated: no   Treatment:    Area cleansed with:  Saline   Amount of cleaning:  Standard   Irrigation method:  Syringe Skin repair:    Repair method:  Staples   Number of staples:  5 Post-procedure  details:    Dressing:  Antibiotic ointment   Patient tolerance of procedure:  Tolerated well, no immediate complications    Critical Care performed: No  ____________________________________________   INITIAL IMPRESSION / ASSESSMENT AND PLAN / ED COURSE  Pertinent labs & imaging results that were available during my care of the patient were reviewed by me and considered in my medical decision making (see chart for details).  This is a 83 year old female who comes to the hospital today after rolling out of bed. The patient has a laceration to her left parietal scalp and a bruise to her left shoulder and hand. I sent the patient for a CT scan of her head and cervical spine as well as some x-rays of her shoulder and hand. The patient's imaging studies unremarkable. I did repair the patient's laceration. She'll be discharged back to her nursing home. The patient still does not know where she is or who she is but she does have Alzheimer's dementia and that is her baseline. The patient should have her staples removed in 7-10 days.      ____________________________________________   FINAL CLINICAL IMPRESSION(S) / ED DIAGNOSES  Final diagnoses:  Injury of head, initial encounter  Laceration of scalp, initial encounter      NEW MEDICATIONS STARTED DURING THIS VISIT:  New Prescriptions   BACITRACIN OINTMENT    Apply to affected area daily     Note:  This document was prepared using Dragon voice recognition software and may include unintentional dictation errors.    Loney Hering, MD 09/14/16 [phone]

## 2016-09-14 NOTE — ED Notes (Signed)
RN spoke with [employer] on call. Supervisor states she will pick the pt up and transport her back to [place].

## 2016-09-14 NOTE — ED Notes (Signed)
Signature pad not working. Hard copy printed and caregiver signed.

## 2016-09-15 DIAGNOSIS — I10 Essential (primary) hypertension: Secondary | ICD-10-CM | POA: Diagnosis not present

## 2016-09-15 DIAGNOSIS — G309 Alzheimer's disease, unspecified: Secondary | ICD-10-CM | POA: Diagnosis not present

## 2016-09-15 DIAGNOSIS — R296 Repeated falls: Secondary | ICD-10-CM | POA: Diagnosis not present

## 2016-09-15 DIAGNOSIS — E785 Hyperlipidemia, unspecified: Secondary | ICD-10-CM | POA: Diagnosis not present

## 2016-09-15 DIAGNOSIS — S0101XA Laceration without foreign body of scalp, initial encounter: Secondary | ICD-10-CM | POA: Diagnosis not present

## 2016-09-15 DIAGNOSIS — R2689 Other abnormalities of gait and mobility: Secondary | ICD-10-CM | POA: Diagnosis not present

## 2016-09-15 DIAGNOSIS — F0150 Vascular dementia without behavioral disturbance: Secondary | ICD-10-CM | POA: Diagnosis not present

## 2016-09-15 DIAGNOSIS — R1310 Dysphagia, unspecified: Secondary | ICD-10-CM | POA: Diagnosis not present

## 2016-09-15 DIAGNOSIS — F0280 Dementia in other diseases classified elsewhere without behavioral disturbance: Secondary | ICD-10-CM | POA: Diagnosis not present

## 2016-09-15 DIAGNOSIS — G933 Postviral fatigue syndrome: Secondary | ICD-10-CM | POA: Diagnosis not present

## 2016-09-15 DIAGNOSIS — I679 Cerebrovascular disease, unspecified: Secondary | ICD-10-CM | POA: Diagnosis not present

## 2016-09-16 DIAGNOSIS — E785 Hyperlipidemia, unspecified: Secondary | ICD-10-CM | POA: Diagnosis not present

## 2016-09-16 DIAGNOSIS — F0150 Vascular dementia without behavioral disturbance: Secondary | ICD-10-CM | POA: Diagnosis not present

## 2016-09-16 DIAGNOSIS — F0280 Dementia in other diseases classified elsewhere without behavioral disturbance: Secondary | ICD-10-CM | POA: Diagnosis not present

## 2016-09-16 DIAGNOSIS — I679 Cerebrovascular disease, unspecified: Secondary | ICD-10-CM | POA: Diagnosis not present

## 2016-09-16 DIAGNOSIS — G933 Postviral fatigue syndrome: Secondary | ICD-10-CM | POA: Diagnosis not present

## 2016-09-16 DIAGNOSIS — R1310 Dysphagia, unspecified: Secondary | ICD-10-CM | POA: Diagnosis not present

## 2016-09-16 DIAGNOSIS — I10 Essential (primary) hypertension: Secondary | ICD-10-CM | POA: Diagnosis not present

## 2016-09-16 DIAGNOSIS — R2689 Other abnormalities of gait and mobility: Secondary | ICD-10-CM | POA: Diagnosis not present

## 2016-09-16 DIAGNOSIS — G309 Alzheimer's disease, unspecified: Secondary | ICD-10-CM | POA: Diagnosis not present

## 2016-09-16 DIAGNOSIS — R296 Repeated falls: Secondary | ICD-10-CM | POA: Diagnosis not present

## 2016-09-17 DIAGNOSIS — G309 Alzheimer's disease, unspecified: Secondary | ICD-10-CM | POA: Diagnosis not present

## 2016-09-17 DIAGNOSIS — E785 Hyperlipidemia, unspecified: Secondary | ICD-10-CM | POA: Diagnosis not present

## 2016-09-17 DIAGNOSIS — R1310 Dysphagia, unspecified: Secondary | ICD-10-CM | POA: Diagnosis not present

## 2016-09-17 DIAGNOSIS — R296 Repeated falls: Secondary | ICD-10-CM | POA: Diagnosis not present

## 2016-09-17 DIAGNOSIS — I10 Essential (primary) hypertension: Secondary | ICD-10-CM | POA: Diagnosis not present

## 2016-09-17 DIAGNOSIS — F0150 Vascular dementia without behavioral disturbance: Secondary | ICD-10-CM | POA: Diagnosis not present

## 2016-09-17 DIAGNOSIS — G933 Postviral fatigue syndrome: Secondary | ICD-10-CM | POA: Diagnosis not present

## 2016-09-17 DIAGNOSIS — I679 Cerebrovascular disease, unspecified: Secondary | ICD-10-CM | POA: Diagnosis not present

## 2016-09-17 DIAGNOSIS — F0280 Dementia in other diseases classified elsewhere without behavioral disturbance: Secondary | ICD-10-CM | POA: Diagnosis not present

## 2016-09-17 DIAGNOSIS — R2689 Other abnormalities of gait and mobility: Secondary | ICD-10-CM | POA: Diagnosis not present

## 2016-09-18 DIAGNOSIS — R296 Repeated falls: Secondary | ICD-10-CM | POA: Diagnosis not present

## 2016-09-18 DIAGNOSIS — I10 Essential (primary) hypertension: Secondary | ICD-10-CM | POA: Diagnosis not present

## 2016-09-18 DIAGNOSIS — E785 Hyperlipidemia, unspecified: Secondary | ICD-10-CM | POA: Diagnosis not present

## 2016-09-18 DIAGNOSIS — R2689 Other abnormalities of gait and mobility: Secondary | ICD-10-CM | POA: Diagnosis not present

## 2016-09-18 DIAGNOSIS — F0280 Dementia in other diseases classified elsewhere without behavioral disturbance: Secondary | ICD-10-CM | POA: Diagnosis not present

## 2016-09-18 DIAGNOSIS — I679 Cerebrovascular disease, unspecified: Secondary | ICD-10-CM | POA: Diagnosis not present

## 2016-09-18 DIAGNOSIS — G933 Postviral fatigue syndrome: Secondary | ICD-10-CM | POA: Diagnosis not present

## 2016-09-18 DIAGNOSIS — G309 Alzheimer's disease, unspecified: Secondary | ICD-10-CM | POA: Diagnosis not present

## 2016-09-18 DIAGNOSIS — R1310 Dysphagia, unspecified: Secondary | ICD-10-CM | POA: Diagnosis not present

## 2016-09-18 DIAGNOSIS — F0150 Vascular dementia without behavioral disturbance: Secondary | ICD-10-CM | POA: Diagnosis not present

## 2016-09-19 DIAGNOSIS — G933 Postviral fatigue syndrome: Secondary | ICD-10-CM | POA: Diagnosis not present

## 2016-09-20 DIAGNOSIS — G933 Postviral fatigue syndrome: Secondary | ICD-10-CM | POA: Diagnosis not present

## 2016-09-21 DIAGNOSIS — G933 Postviral fatigue syndrome: Secondary | ICD-10-CM | POA: Diagnosis not present

## 2016-09-22 DIAGNOSIS — F0150 Vascular dementia without behavioral disturbance: Secondary | ICD-10-CM | POA: Diagnosis not present

## 2016-09-22 DIAGNOSIS — I679 Cerebrovascular disease, unspecified: Secondary | ICD-10-CM | POA: Diagnosis not present

## 2016-09-22 DIAGNOSIS — F0151 Vascular dementia with behavioral disturbance: Secondary | ICD-10-CM | POA: Diagnosis not present

## 2016-09-22 DIAGNOSIS — I10 Essential (primary) hypertension: Secondary | ICD-10-CM | POA: Diagnosis not present

## 2016-09-22 DIAGNOSIS — G309 Alzheimer's disease, unspecified: Secondary | ICD-10-CM | POA: Diagnosis not present

## 2016-09-22 DIAGNOSIS — E785 Hyperlipidemia, unspecified: Secondary | ICD-10-CM | POA: Diagnosis not present

## 2016-09-22 DIAGNOSIS — R296 Repeated falls: Secondary | ICD-10-CM | POA: Diagnosis not present

## 2016-09-22 DIAGNOSIS — G933 Postviral fatigue syndrome: Secondary | ICD-10-CM | POA: Diagnosis not present

## 2016-09-22 DIAGNOSIS — R1310 Dysphagia, unspecified: Secondary | ICD-10-CM | POA: Diagnosis not present

## 2016-09-22 DIAGNOSIS — F0280 Dementia in other diseases classified elsewhere without behavioral disturbance: Secondary | ICD-10-CM | POA: Diagnosis not present

## 2016-09-22 DIAGNOSIS — F419 Anxiety disorder, unspecified: Secondary | ICD-10-CM | POA: Diagnosis not present

## 2016-09-22 DIAGNOSIS — R2689 Other abnormalities of gait and mobility: Secondary | ICD-10-CM | POA: Diagnosis not present

## 2016-09-23 DIAGNOSIS — G933 Postviral fatigue syndrome: Secondary | ICD-10-CM | POA: Diagnosis not present

## 2016-09-24 DIAGNOSIS — F0150 Vascular dementia without behavioral disturbance: Secondary | ICD-10-CM | POA: Diagnosis not present

## 2016-09-24 DIAGNOSIS — E785 Hyperlipidemia, unspecified: Secondary | ICD-10-CM | POA: Diagnosis not present

## 2016-09-24 DIAGNOSIS — R2689 Other abnormalities of gait and mobility: Secondary | ICD-10-CM | POA: Diagnosis not present

## 2016-09-24 DIAGNOSIS — F0280 Dementia in other diseases classified elsewhere without behavioral disturbance: Secondary | ICD-10-CM | POA: Diagnosis not present

## 2016-09-24 DIAGNOSIS — G309 Alzheimer's disease, unspecified: Secondary | ICD-10-CM | POA: Diagnosis not present

## 2016-09-24 DIAGNOSIS — R296 Repeated falls: Secondary | ICD-10-CM | POA: Diagnosis not present

## 2016-09-24 DIAGNOSIS — I10 Essential (primary) hypertension: Secondary | ICD-10-CM | POA: Diagnosis not present

## 2016-09-24 DIAGNOSIS — I679 Cerebrovascular disease, unspecified: Secondary | ICD-10-CM | POA: Diagnosis not present

## 2016-09-24 DIAGNOSIS — R1310 Dysphagia, unspecified: Secondary | ICD-10-CM | POA: Diagnosis not present

## 2016-09-24 DIAGNOSIS — G933 Postviral fatigue syndrome: Secondary | ICD-10-CM | POA: Diagnosis not present

## 2016-09-25 DIAGNOSIS — E785 Hyperlipidemia, unspecified: Secondary | ICD-10-CM | POA: Diagnosis not present

## 2016-09-25 DIAGNOSIS — I679 Cerebrovascular disease, unspecified: Secondary | ICD-10-CM | POA: Diagnosis not present

## 2016-09-25 DIAGNOSIS — F0150 Vascular dementia without behavioral disturbance: Secondary | ICD-10-CM | POA: Diagnosis not present

## 2016-09-25 DIAGNOSIS — R1310 Dysphagia, unspecified: Secondary | ICD-10-CM | POA: Diagnosis not present

## 2016-09-25 DIAGNOSIS — F0280 Dementia in other diseases classified elsewhere without behavioral disturbance: Secondary | ICD-10-CM | POA: Diagnosis not present

## 2016-09-25 DIAGNOSIS — R296 Repeated falls: Secondary | ICD-10-CM | POA: Diagnosis not present

## 2016-09-25 DIAGNOSIS — G933 Postviral fatigue syndrome: Secondary | ICD-10-CM | POA: Diagnosis not present

## 2016-09-25 DIAGNOSIS — G309 Alzheimer's disease, unspecified: Secondary | ICD-10-CM | POA: Diagnosis not present

## 2016-09-25 DIAGNOSIS — I10 Essential (primary) hypertension: Secondary | ICD-10-CM | POA: Diagnosis not present

## 2016-09-25 DIAGNOSIS — R2689 Other abnormalities of gait and mobility: Secondary | ICD-10-CM | POA: Diagnosis not present

## 2016-09-26 DIAGNOSIS — G933 Postviral fatigue syndrome: Secondary | ICD-10-CM | POA: Diagnosis not present

## 2016-09-27 DIAGNOSIS — G933 Postviral fatigue syndrome: Secondary | ICD-10-CM | POA: Diagnosis not present

## 2016-09-28 DIAGNOSIS — G933 Postviral fatigue syndrome: Secondary | ICD-10-CM | POA: Diagnosis not present

## 2016-09-29 DIAGNOSIS — R296 Repeated falls: Secondary | ICD-10-CM | POA: Diagnosis not present

## 2016-09-29 DIAGNOSIS — G309 Alzheimer's disease, unspecified: Secondary | ICD-10-CM | POA: Diagnosis not present

## 2016-09-29 DIAGNOSIS — I10 Essential (primary) hypertension: Secondary | ICD-10-CM | POA: Diagnosis not present

## 2016-09-29 DIAGNOSIS — F0151 Vascular dementia with behavioral disturbance: Secondary | ICD-10-CM | POA: Diagnosis not present

## 2016-09-29 DIAGNOSIS — R1310 Dysphagia, unspecified: Secondary | ICD-10-CM | POA: Diagnosis not present

## 2016-09-29 DIAGNOSIS — F0150 Vascular dementia without behavioral disturbance: Secondary | ICD-10-CM | POA: Diagnosis not present

## 2016-09-29 DIAGNOSIS — I679 Cerebrovascular disease, unspecified: Secondary | ICD-10-CM | POA: Diagnosis not present

## 2016-09-29 DIAGNOSIS — R2689 Other abnormalities of gait and mobility: Secondary | ICD-10-CM | POA: Diagnosis not present

## 2016-09-29 DIAGNOSIS — E785 Hyperlipidemia, unspecified: Secondary | ICD-10-CM | POA: Diagnosis not present

## 2016-09-29 DIAGNOSIS — G933 Postviral fatigue syndrome: Secondary | ICD-10-CM | POA: Diagnosis not present

## 2016-09-29 DIAGNOSIS — F0280 Dementia in other diseases classified elsewhere without behavioral disturbance: Secondary | ICD-10-CM | POA: Diagnosis not present

## 2016-09-29 DIAGNOSIS — F419 Anxiety disorder, unspecified: Secondary | ICD-10-CM | POA: Diagnosis not present

## 2016-09-30 DIAGNOSIS — G933 Postviral fatigue syndrome: Secondary | ICD-10-CM | POA: Diagnosis not present

## 2016-10-01 DIAGNOSIS — G933 Postviral fatigue syndrome: Secondary | ICD-10-CM | POA: Diagnosis not present

## 2016-10-02 DIAGNOSIS — G933 Postviral fatigue syndrome: Secondary | ICD-10-CM | POA: Diagnosis not present

## 2016-10-03 DIAGNOSIS — G933 Postviral fatigue syndrome: Secondary | ICD-10-CM | POA: Diagnosis not present

## 2016-10-03 DIAGNOSIS — R2689 Other abnormalities of gait and mobility: Secondary | ICD-10-CM | POA: Diagnosis not present

## 2016-10-03 DIAGNOSIS — R296 Repeated falls: Secondary | ICD-10-CM | POA: Diagnosis not present

## 2016-10-03 DIAGNOSIS — I10 Essential (primary) hypertension: Secondary | ICD-10-CM | POA: Diagnosis not present

## 2016-10-03 DIAGNOSIS — F0150 Vascular dementia without behavioral disturbance: Secondary | ICD-10-CM | POA: Diagnosis not present

## 2016-10-03 DIAGNOSIS — E785 Hyperlipidemia, unspecified: Secondary | ICD-10-CM | POA: Diagnosis not present

## 2016-10-03 DIAGNOSIS — F0280 Dementia in other diseases classified elsewhere without behavioral disturbance: Secondary | ICD-10-CM | POA: Diagnosis not present

## 2016-10-03 DIAGNOSIS — I679 Cerebrovascular disease, unspecified: Secondary | ICD-10-CM | POA: Diagnosis not present

## 2016-10-03 DIAGNOSIS — R1310 Dysphagia, unspecified: Secondary | ICD-10-CM | POA: Diagnosis not present

## 2016-10-03 DIAGNOSIS — G309 Alzheimer's disease, unspecified: Secondary | ICD-10-CM | POA: Diagnosis not present

## 2016-10-04 DIAGNOSIS — G933 Postviral fatigue syndrome: Secondary | ICD-10-CM | POA: Diagnosis not present

## 2016-10-05 DIAGNOSIS — G933 Postviral fatigue syndrome: Secondary | ICD-10-CM | POA: Diagnosis not present

## 2016-10-06 DIAGNOSIS — E785 Hyperlipidemia, unspecified: Secondary | ICD-10-CM | POA: Diagnosis not present

## 2016-10-06 DIAGNOSIS — G933 Postviral fatigue syndrome: Secondary | ICD-10-CM | POA: Diagnosis not present

## 2016-10-06 DIAGNOSIS — R1310 Dysphagia, unspecified: Secondary | ICD-10-CM | POA: Diagnosis not present

## 2016-10-06 DIAGNOSIS — I679 Cerebrovascular disease, unspecified: Secondary | ICD-10-CM | POA: Diagnosis not present

## 2016-10-06 DIAGNOSIS — F0280 Dementia in other diseases classified elsewhere without behavioral disturbance: Secondary | ICD-10-CM | POA: Diagnosis not present

## 2016-10-06 DIAGNOSIS — F0150 Vascular dementia without behavioral disturbance: Secondary | ICD-10-CM | POA: Diagnosis not present

## 2016-10-06 DIAGNOSIS — G309 Alzheimer's disease, unspecified: Secondary | ICD-10-CM | POA: Diagnosis not present

## 2016-10-06 DIAGNOSIS — R2689 Other abnormalities of gait and mobility: Secondary | ICD-10-CM | POA: Diagnosis not present

## 2016-10-06 DIAGNOSIS — I10 Essential (primary) hypertension: Secondary | ICD-10-CM | POA: Diagnosis not present

## 2016-10-06 DIAGNOSIS — R296 Repeated falls: Secondary | ICD-10-CM | POA: Diagnosis not present

## 2016-10-07 DIAGNOSIS — G309 Alzheimer's disease, unspecified: Secondary | ICD-10-CM | POA: Diagnosis not present

## 2016-10-07 DIAGNOSIS — E785 Hyperlipidemia, unspecified: Secondary | ICD-10-CM | POA: Diagnosis not present

## 2016-10-07 DIAGNOSIS — R296 Repeated falls: Secondary | ICD-10-CM | POA: Diagnosis not present

## 2016-10-07 DIAGNOSIS — I10 Essential (primary) hypertension: Secondary | ICD-10-CM | POA: Diagnosis not present

## 2016-10-07 DIAGNOSIS — R2689 Other abnormalities of gait and mobility: Secondary | ICD-10-CM | POA: Diagnosis not present

## 2016-10-07 DIAGNOSIS — F0280 Dementia in other diseases classified elsewhere without behavioral disturbance: Secondary | ICD-10-CM | POA: Diagnosis not present

## 2016-10-07 DIAGNOSIS — I679 Cerebrovascular disease, unspecified: Secondary | ICD-10-CM | POA: Diagnosis not present

## 2016-10-07 DIAGNOSIS — F0150 Vascular dementia without behavioral disturbance: Secondary | ICD-10-CM | POA: Diagnosis not present

## 2016-10-07 DIAGNOSIS — G933 Postviral fatigue syndrome: Secondary | ICD-10-CM | POA: Diagnosis not present

## 2016-10-07 DIAGNOSIS — R1310 Dysphagia, unspecified: Secondary | ICD-10-CM | POA: Diagnosis not present

## 2016-10-08 DIAGNOSIS — F419 Anxiety disorder, unspecified: Secondary | ICD-10-CM | POA: Diagnosis not present

## 2016-10-08 DIAGNOSIS — F0151 Vascular dementia with behavioral disturbance: Secondary | ICD-10-CM | POA: Diagnosis not present

## 2016-10-08 DIAGNOSIS — G933 Postviral fatigue syndrome: Secondary | ICD-10-CM | POA: Diagnosis not present

## 2016-10-09 DIAGNOSIS — G933 Postviral fatigue syndrome: Secondary | ICD-10-CM | POA: Diagnosis not present

## 2016-10-10 DIAGNOSIS — L84 Corns and callosities: Secondary | ICD-10-CM | POA: Diagnosis not present

## 2016-10-10 DIAGNOSIS — I739 Peripheral vascular disease, unspecified: Secondary | ICD-10-CM | POA: Diagnosis not present

## 2016-10-10 DIAGNOSIS — G933 Postviral fatigue syndrome: Secondary | ICD-10-CM | POA: Diagnosis not present

## 2016-10-10 DIAGNOSIS — B351 Tinea unguium: Secondary | ICD-10-CM | POA: Diagnosis not present

## 2016-10-11 DIAGNOSIS — G933 Postviral fatigue syndrome: Secondary | ICD-10-CM | POA: Diagnosis not present

## 2016-10-12 DIAGNOSIS — G933 Postviral fatigue syndrome: Secondary | ICD-10-CM | POA: Diagnosis not present

## 2016-10-13 DIAGNOSIS — F0280 Dementia in other diseases classified elsewhere without behavioral disturbance: Secondary | ICD-10-CM | POA: Diagnosis not present

## 2016-10-13 DIAGNOSIS — R2689 Other abnormalities of gait and mobility: Secondary | ICD-10-CM | POA: Diagnosis not present

## 2016-10-13 DIAGNOSIS — I679 Cerebrovascular disease, unspecified: Secondary | ICD-10-CM | POA: Diagnosis not present

## 2016-10-13 DIAGNOSIS — I10 Essential (primary) hypertension: Secondary | ICD-10-CM | POA: Diagnosis not present

## 2016-10-13 DIAGNOSIS — G933 Postviral fatigue syndrome: Secondary | ICD-10-CM | POA: Diagnosis not present

## 2016-10-13 DIAGNOSIS — G309 Alzheimer's disease, unspecified: Secondary | ICD-10-CM | POA: Diagnosis not present

## 2016-10-13 DIAGNOSIS — F0150 Vascular dementia without behavioral disturbance: Secondary | ICD-10-CM | POA: Diagnosis not present

## 2016-10-13 DIAGNOSIS — R296 Repeated falls: Secondary | ICD-10-CM | POA: Diagnosis not present

## 2016-10-13 DIAGNOSIS — R1310 Dysphagia, unspecified: Secondary | ICD-10-CM | POA: Diagnosis not present

## 2016-10-13 DIAGNOSIS — E785 Hyperlipidemia, unspecified: Secondary | ICD-10-CM | POA: Diagnosis not present

## 2016-10-14 DIAGNOSIS — G933 Postviral fatigue syndrome: Secondary | ICD-10-CM | POA: Diagnosis not present

## 2016-10-14 DIAGNOSIS — R32 Unspecified urinary incontinence: Secondary | ICD-10-CM | POA: Diagnosis not present

## 2016-10-14 DIAGNOSIS — F0390 Unspecified dementia without behavioral disturbance: Secondary | ICD-10-CM | POA: Diagnosis not present

## 2016-10-15 DIAGNOSIS — G933 Postviral fatigue syndrome: Secondary | ICD-10-CM | POA: Diagnosis not present

## 2016-10-16 DIAGNOSIS — E785 Hyperlipidemia, unspecified: Secondary | ICD-10-CM | POA: Diagnosis not present

## 2016-10-16 DIAGNOSIS — R2689 Other abnormalities of gait and mobility: Secondary | ICD-10-CM | POA: Diagnosis not present

## 2016-10-16 DIAGNOSIS — I10 Essential (primary) hypertension: Secondary | ICD-10-CM | POA: Diagnosis not present

## 2016-10-16 DIAGNOSIS — I679 Cerebrovascular disease, unspecified: Secondary | ICD-10-CM | POA: Diagnosis not present

## 2016-10-16 DIAGNOSIS — G309 Alzheimer's disease, unspecified: Secondary | ICD-10-CM | POA: Diagnosis not present

## 2016-10-16 DIAGNOSIS — F0280 Dementia in other diseases classified elsewhere without behavioral disturbance: Secondary | ICD-10-CM | POA: Diagnosis not present

## 2016-10-16 DIAGNOSIS — G933 Postviral fatigue syndrome: Secondary | ICD-10-CM | POA: Diagnosis not present

## 2016-10-16 DIAGNOSIS — R1310 Dysphagia, unspecified: Secondary | ICD-10-CM | POA: Diagnosis not present

## 2016-10-16 DIAGNOSIS — R296 Repeated falls: Secondary | ICD-10-CM | POA: Diagnosis not present

## 2016-10-16 DIAGNOSIS — F0150 Vascular dementia without behavioral disturbance: Secondary | ICD-10-CM | POA: Diagnosis not present

## 2016-10-17 DIAGNOSIS — G933 Postviral fatigue syndrome: Secondary | ICD-10-CM | POA: Diagnosis not present

## 2016-10-18 DIAGNOSIS — G933 Postviral fatigue syndrome: Secondary | ICD-10-CM | POA: Diagnosis not present

## 2016-10-19 DIAGNOSIS — G933 Postviral fatigue syndrome: Secondary | ICD-10-CM | POA: Diagnosis not present

## 2016-10-20 DIAGNOSIS — G933 Postviral fatigue syndrome: Secondary | ICD-10-CM | POA: Diagnosis not present

## 2016-10-21 DIAGNOSIS — G933 Postviral fatigue syndrome: Secondary | ICD-10-CM | POA: Diagnosis not present

## 2016-10-22 DIAGNOSIS — E785 Hyperlipidemia, unspecified: Secondary | ICD-10-CM | POA: Diagnosis not present

## 2016-10-22 DIAGNOSIS — I10 Essential (primary) hypertension: Secondary | ICD-10-CM | POA: Diagnosis not present

## 2016-10-22 DIAGNOSIS — F0150 Vascular dementia without behavioral disturbance: Secondary | ICD-10-CM | POA: Diagnosis not present

## 2016-10-22 DIAGNOSIS — G933 Postviral fatigue syndrome: Secondary | ICD-10-CM | POA: Diagnosis not present

## 2016-10-22 DIAGNOSIS — F0280 Dementia in other diseases classified elsewhere without behavioral disturbance: Secondary | ICD-10-CM | POA: Diagnosis not present

## 2016-10-22 DIAGNOSIS — R1310 Dysphagia, unspecified: Secondary | ICD-10-CM | POA: Diagnosis not present

## 2016-10-22 DIAGNOSIS — G309 Alzheimer's disease, unspecified: Secondary | ICD-10-CM | POA: Diagnosis not present

## 2016-10-22 DIAGNOSIS — I679 Cerebrovascular disease, unspecified: Secondary | ICD-10-CM | POA: Diagnosis not present

## 2016-10-22 DIAGNOSIS — R296 Repeated falls: Secondary | ICD-10-CM | POA: Diagnosis not present

## 2016-10-22 DIAGNOSIS — R2689 Other abnormalities of gait and mobility: Secondary | ICD-10-CM | POA: Diagnosis not present

## 2016-10-23 DIAGNOSIS — I679 Cerebrovascular disease, unspecified: Secondary | ICD-10-CM | POA: Diagnosis not present

## 2016-10-23 DIAGNOSIS — F0280 Dementia in other diseases classified elsewhere without behavioral disturbance: Secondary | ICD-10-CM | POA: Diagnosis not present

## 2016-10-23 DIAGNOSIS — G933 Postviral fatigue syndrome: Secondary | ICD-10-CM | POA: Diagnosis not present

## 2016-10-23 DIAGNOSIS — R296 Repeated falls: Secondary | ICD-10-CM | POA: Diagnosis not present

## 2016-10-23 DIAGNOSIS — G309 Alzheimer's disease, unspecified: Secondary | ICD-10-CM | POA: Diagnosis not present

## 2016-10-23 DIAGNOSIS — F0150 Vascular dementia without behavioral disturbance: Secondary | ICD-10-CM | POA: Diagnosis not present

## 2016-10-23 DIAGNOSIS — R1310 Dysphagia, unspecified: Secondary | ICD-10-CM | POA: Diagnosis not present

## 2016-10-23 DIAGNOSIS — R2689 Other abnormalities of gait and mobility: Secondary | ICD-10-CM | POA: Diagnosis not present

## 2016-10-23 DIAGNOSIS — I10 Essential (primary) hypertension: Secondary | ICD-10-CM | POA: Diagnosis not present

## 2016-10-23 DIAGNOSIS — E785 Hyperlipidemia, unspecified: Secondary | ICD-10-CM | POA: Diagnosis not present

## 2016-10-24 DIAGNOSIS — G933 Postviral fatigue syndrome: Secondary | ICD-10-CM | POA: Diagnosis not present

## 2016-10-25 DIAGNOSIS — G933 Postviral fatigue syndrome: Secondary | ICD-10-CM | POA: Diagnosis not present

## 2016-10-26 DIAGNOSIS — G933 Postviral fatigue syndrome: Secondary | ICD-10-CM | POA: Diagnosis not present

## 2016-10-27 DIAGNOSIS — I10 Essential (primary) hypertension: Secondary | ICD-10-CM | POA: Diagnosis not present

## 2016-10-27 DIAGNOSIS — F0151 Vascular dementia with behavioral disturbance: Secondary | ICD-10-CM | POA: Diagnosis not present

## 2016-10-27 DIAGNOSIS — R2689 Other abnormalities of gait and mobility: Secondary | ICD-10-CM | POA: Diagnosis not present

## 2016-10-27 DIAGNOSIS — E785 Hyperlipidemia, unspecified: Secondary | ICD-10-CM | POA: Diagnosis not present

## 2016-10-27 DIAGNOSIS — G309 Alzheimer's disease, unspecified: Secondary | ICD-10-CM | POA: Diagnosis not present

## 2016-10-27 DIAGNOSIS — R296 Repeated falls: Secondary | ICD-10-CM | POA: Diagnosis not present

## 2016-10-27 DIAGNOSIS — G933 Postviral fatigue syndrome: Secondary | ICD-10-CM | POA: Diagnosis not present

## 2016-10-27 DIAGNOSIS — F0280 Dementia in other diseases classified elsewhere without behavioral disturbance: Secondary | ICD-10-CM | POA: Diagnosis not present

## 2016-10-27 DIAGNOSIS — F0150 Vascular dementia without behavioral disturbance: Secondary | ICD-10-CM | POA: Diagnosis not present

## 2016-10-27 DIAGNOSIS — R1310 Dysphagia, unspecified: Secondary | ICD-10-CM | POA: Diagnosis not present

## 2016-10-27 DIAGNOSIS — I679 Cerebrovascular disease, unspecified: Secondary | ICD-10-CM | POA: Diagnosis not present

## 2016-10-28 DIAGNOSIS — F0150 Vascular dementia without behavioral disturbance: Secondary | ICD-10-CM | POA: Diagnosis not present

## 2016-10-28 DIAGNOSIS — R1310 Dysphagia, unspecified: Secondary | ICD-10-CM | POA: Diagnosis not present

## 2016-10-28 DIAGNOSIS — I10 Essential (primary) hypertension: Secondary | ICD-10-CM | POA: Diagnosis not present

## 2016-10-28 DIAGNOSIS — E785 Hyperlipidemia, unspecified: Secondary | ICD-10-CM | POA: Diagnosis not present

## 2016-10-28 DIAGNOSIS — G933 Postviral fatigue syndrome: Secondary | ICD-10-CM | POA: Diagnosis not present

## 2016-10-28 DIAGNOSIS — R296 Repeated falls: Secondary | ICD-10-CM | POA: Diagnosis not present

## 2016-10-28 DIAGNOSIS — G309 Alzheimer's disease, unspecified: Secondary | ICD-10-CM | POA: Diagnosis not present

## 2016-10-28 DIAGNOSIS — R2689 Other abnormalities of gait and mobility: Secondary | ICD-10-CM | POA: Diagnosis not present

## 2016-10-28 DIAGNOSIS — I679 Cerebrovascular disease, unspecified: Secondary | ICD-10-CM | POA: Diagnosis not present

## 2016-10-28 DIAGNOSIS — F0280 Dementia in other diseases classified elsewhere without behavioral disturbance: Secondary | ICD-10-CM | POA: Diagnosis not present

## 2016-10-29 ENCOUNTER — Emergency Department
Admission: EM | Admit: 2016-10-29 | Discharge: 2016-10-29 | Disposition: A | Discharge status: Home / Self Care | Payer: Medicare HMO | Attending: Emergency Medicine | Admitting: Emergency Medicine

## 2016-10-29 DIAGNOSIS — E860 Dehydration: Secondary | ICD-10-CM | POA: Diagnosis not present

## 2016-10-29 DIAGNOSIS — Z7982 Long term (current) use of aspirin: Secondary | ICD-10-CM | POA: Diagnosis not present

## 2016-10-29 DIAGNOSIS — G933 Postviral fatigue syndrome: Secondary | ICD-10-CM | POA: Diagnosis not present

## 2016-10-29 DIAGNOSIS — R42 Dizziness and giddiness: Secondary | ICD-10-CM | POA: Insufficient documentation

## 2016-10-29 DIAGNOSIS — F0390 Unspecified dementia without behavioral disturbance: Secondary | ICD-10-CM | POA: Insufficient documentation

## 2016-10-29 DIAGNOSIS — R531 Weakness: Secondary | ICD-10-CM | POA: Diagnosis not present

## 2016-10-29 DIAGNOSIS — Z87891 Personal history of nicotine dependence: Secondary | ICD-10-CM | POA: Insufficient documentation

## 2016-10-29 DIAGNOSIS — Z7401 Bed confinement status: Secondary | ICD-10-CM | POA: Diagnosis not present

## 2016-10-29 DIAGNOSIS — R112 Nausea with vomiting, unspecified: Secondary | ICD-10-CM | POA: Insufficient documentation

## 2016-10-29 DIAGNOSIS — N3000 Acute cystitis without hematuria: Secondary | ICD-10-CM | POA: Diagnosis not present

## 2016-10-29 DIAGNOSIS — R197 Diarrhea, unspecified: Secondary | ICD-10-CM | POA: Diagnosis not present

## 2016-10-29 DIAGNOSIS — I10 Essential (primary) hypertension: Secondary | ICD-10-CM | POA: Insufficient documentation

## 2016-10-29 DIAGNOSIS — R4182 Altered mental status, unspecified: Secondary | ICD-10-CM | POA: Diagnosis not present

## 2016-10-29 DIAGNOSIS — Z79899 Other long term (current) drug therapy: Secondary | ICD-10-CM | POA: Diagnosis not present

## 2016-10-29 LAB — URINALYSIS, COMPLETE (UACMP) WITH MICROSCOPIC
Bilirubin Urine: NEGATIVE
Glucose, UA: NEGATIVE mg/dL
Hgb urine dipstick: NEGATIVE
Ketones, ur: NEGATIVE mg/dL
Nitrite: POSITIVE — AB
PH: 5 (ref 5.0–8.0)
Protein, ur: NEGATIVE mg/dL
SPECIFIC GRAVITY, URINE: 1.015 (ref 1.005–1.030)

## 2016-10-29 LAB — CBC WITH DIFFERENTIAL/PLATELET
BASOS ABS: 0.1 10*3/uL (ref 0–0.1)
Basophils Relative: 1 %
Eosinophils Absolute: 0.2 10*3/uL (ref 0–0.7)
Eosinophils Relative: 2 %
HCT: 36.2 % (ref 35.0–47.0)
Hemoglobin: 12.4 g/dL (ref 12.0–16.0)
Lymphocytes Relative: 11 %
Lymphs Abs: 1 10*3/uL (ref 1.0–3.6)
MCH: 31.4 pg (ref 26.0–34.0)
MCHC: 34.3 g/dL (ref 32.0–36.0)
MCV: 91.5 fL (ref 80.0–100.0)
MONO ABS: 0.7 10*3/uL (ref 0.2–0.9)
Monocytes Relative: 7 %
Neutro Abs: 7.4 10*3/uL — ABNORMAL HIGH (ref 1.4–6.5)
Neutrophils Relative %: 79 %
PLATELETS: 260 10*3/uL (ref 150–440)
RBC: 3.96 MIL/uL (ref 3.80–5.20)
RDW: 15.2 % — AB (ref 11.5–14.5)
WBC: 9.4 10*3/uL (ref 3.6–11.0)

## 2016-10-29 LAB — BASIC METABOLIC PANEL
ANION GAP: 9 (ref 5–15)
BUN: 18 mg/dL (ref 6–20)
CALCIUM: 8.9 mg/dL (ref 8.9–10.3)
CO2: 29 mmol/L (ref 22–32)
CREATININE: 0.82 mg/dL (ref 0.44–1.00)
Chloride: 103 mmol/L (ref 101–111)
GFR calc Af Amer: >60 mL/min (ref >60)
Glucose, Bld: 139 mg/dL — ABNORMAL HIGH (ref 65–99)
Potassium: 3.4 mmol/L — ABNORMAL LOW (ref 3.5–5.1)
Sodium: 141 mmol/L (ref 135–145)

## 2016-10-29 LAB — C DIFFICILE QUICK SCREEN W PCR REFLEX
C DIFFICILE (CDIFF) INTERP: NOT DETECTED
C DIFFICILE (CDIFF) TOXIN: NEGATIVE
C Diff antigen: NEGATIVE

## 2016-10-29 MED ORDER — ONDANSETRON HCL 4 MG PO TABS: 4.0000 mg | ORAL_TABLET | Freq: Three times a day (TID) | ORAL | 0 refills | Status: DC | PRN

## 2016-10-29 MED ORDER — CEPHALEXIN 500 MG PO CAPS: 500.0000 mg | ORAL_CAPSULE | Freq: Three times a day (TID) | ORAL | 0 refills | Status: AC

## 2016-10-29 MED ORDER — DEXTROSE 5 % IV SOLN
1.0000 g | Freq: Once | INTRAVENOUS | Status: AC
  Administered 2016-10-29: 1 g via INTRAVENOUS
  Filled 2016-10-29: qty 10

## 2016-10-29 MED ORDER — ONDANSETRON HCL 4 MG/2ML IJ SOLN
4.0000 mg | Freq: Once | INTRAMUSCULAR | Status: AC
  Administered 2016-10-29: 4 mg via INTRAVENOUS
  Filled 2016-10-29: qty 2

## 2016-10-29 MED ORDER — SODIUM CHLORIDE 0.9 % IV BOLUS (SEPSIS)
1000.0000 mL | Freq: Once | INTRAVENOUS | Status: AC
  Administered 2016-10-29: 1000 mL via INTRAVENOUS

## 2016-10-29 NOTE — ED Provider Notes (Signed)
[facility] Emergency Department Provider Note  ____________________________________________  Time seen: Approximately 12:04 PM  I have reviewed the triage vital signs and the nursing notes.   HISTORY  Chief Complaint Weakness and Emesis   HPI Sarah [name] is a 83 y.o. female with a history of dementia, hypertension, hyperlipidemia, CVAwho presents via EMS from [facility] for generalized weakness, nausea, vomiting, and diarrhea for the last 2-3 days. Patient endorses several daily episodes of nonbloody nonbilious emesis and watery diarrhea. Symptoms constant and severe for 3 days.  Today she was feeling very weak. She denies abdominal pain. She does endorse feeling lightheaded. No fever, no chills, no dysuria or hematuria. No recent antibiotics. No history of C. difficile.  Past Medical History:  Diagnosis Date  . Dementia   . Fall   . Hyperlipidemia   . Hypertension   . Macular degeneration   . Osteoporosis     Patient Active Problem List   Diagnosis Date Noted  . Mass of upper lobe of right lung 07/11/2016  . CVA (cerebral vascular accident) ([place]) 07/02/2016    No past surgical history on file.  Prior to Admission medications   Medication Sig Start Date End Date Taking? Authorizing Provider  acetaminophen (TYLENOL) 500 MG tablet Take 500 mg by mouth 2 (two) times daily.   Yes [provider]  amLODipine (NORVASC) 10 MG tablet Take 10 mg by mouth daily.    Yes [provider]  aspirin EC 81 MG EC tablet Take 1 tablet (81 mg total) by mouth daily. 07/05/16  Yes Mody, Ulice Bold, MD  Cholecalciferol (VITAMIN D-1000 MAX ST) 1000 units tablet Take 1 tablet by mouth daily.   Yes [provider]  Cranberry 450 MG CAPS Take 450 mg by mouth daily.    Yes [provider]  donepezil (ARICEPT) 5 MG tablet Take 5 mg by mouth at bedtime.   Yes [provider]  escitalopram (LEXAPRO) 10 MG tablet Take  10 mg by mouth at bedtime.   Yes [provider]  hydrochlorothiazide (HYDRODIURIL) 25 MG tablet Take 25 mg by mouth daily.   Yes [provider]  Melatonin 5 MG TABS Take 1 tablet by mouth at bedtime.   Yes [provider]  memantine (NAMENDA) 10 MG tablet Take 10 mg by mouth 2 (two) times daily.  08/06/16  Yes [provider]  methimazole (TAPAZOLE) 10 MG tablet Take 10 mg by mouth daily.   Yes [provider]  mirtazapine (REMERON) 15 MG tablet Take 7.5 mg by mouth at bedtime. 08/13/16  Yes [provider]  Multiple Vitamins-Minerals (PRESERVISION AREDS) TABS Take 1 capsule by mouth 2 (two) times daily.   Yes [provider]  potassium chloride SA (K-DUR,KLOR-CON) 20 MEQ tablet Take 20 mEq by mouth daily.    Yes [provider]  risperiDONE (RISPERDAL) 0.25 MG tablet Take 0.25 mg by mouth 2 (two) times daily.   Yes [provider]  acetaminophen (TYLENOL) 325 MG tablet Take 650 mg by mouth every 4 (four) hours as needed for mild pain, fever or headache.     [provider]  bacitracin ointment Apply to affected area daily Patient not taking: Reported on 10/29/2016 09/14/16 09/14/17  Loney Hering, MD  cephALEXin (KEFLEX) 500 MG capsule Take 1 capsule (500 mg total) by mouth 3 (three) times daily. 10/29/16 11/05/16  Rudene Re, MD  loperamide (IMODIUM) 2 MG capsule Take 2 mg by mouth every  6 (six) hours as needed for diarrhea or loose stools.    [provider]  ondansetron (ZOFRAN ODT) 4 MG disintegrating tablet Take 1 tablet (4 mg total) by mouth every 8 (eight) hours as needed for nausea or vomiting. Patient taking differently: Take 4 mg by mouth every 6 (six) hours as needed for nausea or vomiting.  06/30/16   Alfred Levins, [place], MD  ondansetron (ZOFRAN) 4 MG tablet Take 1 tablet (4 mg total) by mouth every 8 (eight) hours as needed for nausea or vomiting. 10/29/16   Rudene Re, MD     Allergies Patient has no known allergies.  Family History  Problem Relation Age of Onset  . CVA Mother   . Dementia Father     Social History Social History  Substance Use Topics  . Smoking status: Former Research scientist (life sciences)  . Smokeless tobacco: Never Used  . Alcohol use No    Review of Systems  Constitutional: Negative for fever. + lightheadedness Eyes: Negative for visual changes. ENT: Negative for sore throat. Neck: No neck pain  Cardiovascular: Negative for chest pain. Respiratory: Negative for shortness of breath. Gastrointestinal: Negative for abdominal pain. + vomiting and diarrhea. Genitourinary: Negative for dysuria. Musculoskeletal: Negative for back pain. Skin: Negative for rash. Neurological: Negative for headaches, weakness or numbness. Psych: No SI or HI  ____________________________________________   PHYSICAL EXAM:  VITAL SIGNS: ED Triage Vitals [10/29/16 0941]  Enc Vitals Group     BP (!) 146/49     Pulse Rate (!) 59     Resp 17     Temp 97.6 F (36.4 C)     Temp Source Oral     SpO2 97 %     Weight 150 lb (68 kg)     Height 5\' 3"  (1.6 m)     Head Circumference      Peak Flow      Pain Score      Pain Loc      Pain Edu?      Excl. in [place]?     Constitutional: Alert and oriented. Well appearing and in no apparent distress. HEENT:      Head: Normocephalic and atraumatic.         Eyes: Conjunctivae are normal. Sclera is non-icteric.       Mouth/Throat: Mucous membranes are dry.       Neck: Supple with no signs of meningismus. Cardiovascular: Regular rate and rhythm. No murmurs, gallops, or rubs. 2+ symmetrical distal pulses are present in all extremities. No JVD. Respiratory: Normal respiratory effort. Lungs are clear to auscultation bilaterally. No wheezes, crackles, or rhonchi.  Gastrointestinal: Soft, non tender, and non distended with positive bowel sounds. No rebound or guarding. [place] stool guaiac negative Musculoskeletal: Nontender with  normal range of motion in all extremities. No edema, cyanosis, or erythema of extremities. Neurologic: Normal speech and language. Face is symmetric. Moving all extremities. No gross focal neurologic deficits are appreciated. Skin: Skin is warm, dry and intact. No rash noted. Psychiatric: Mood and affect are normal. Speech and behavior are normal.  ____________________________________________   LABS (all labs ordered are listed, but only abnormal results are displayed)  Labs Reviewed  CBC WITH DIFFERENTIAL/PLATELET - Abnormal; Notable for the following:       Result Value   RDW 15.2 (*)    Neutro Abs 7.4 (*)    All other components within normal limits  BASIC METABOLIC PANEL - Abnormal; Notable for the following:    Potassium 3.4 (*)  Glucose, Bld 139 (*)    All other components within normal limits  URINALYSIS, COMPLETE (UACMP) WITH MICROSCOPIC - Abnormal; Notable for the following:    Color, Urine YELLOW (*)    APPearance HAZY (*)    Nitrite POSITIVE (*)    Leukocytes, UA LARGE (*)    Bacteria, UA MANY (*)    Squamous Epithelial / LPF 0-5 (*)    All other components within normal limits  C DIFFICILE QUICK SCREEN W PCR REFLEX  URINE CULTURE   ____________________________________________  EKG  ED ECG REPORT I, Rudene Re, the attending physician, personally viewed and interpreted this ECG.  Normal sinus rhythm, rate of 61, normal intervals, normal axis, no ST elevations or depressions.  ____________________________________________  RADIOLOGY  none  ____________________________________________   PROCEDURES  Procedure(s) performed: None Procedures Critical Care performed:  None ____________________________________________   INITIAL IMPRESSION / ASSESSMENT AND PLAN / ED COURSE   83 y.o. female with a history of dementia, hypertension, hyperlipidemia, CVAwho presents via EMS from [facility] for generalized weakness, nausea, vomiting,  and diarrhea for the last 2-3 days. Patient looks dry on exam. Had a large BM upon arrival. Abdomen is soft and non tender. Vitals WNL. Will check labs for signs of dehydration, electrolyte abnormalities, we'll C. Difficile. we'll give IV fluids, and Zofran.    _________________________ 2:31 PM on 10/29/2016 -----------------------------------------  Labs showing normal creatinine and electrolytes. Normal WBC, C. Diff negative. Patient looks markedly improved and reports she feels much better. UA is pending. Patient tolerating PO. Had two BMs in the ED, not vomiting. Plan to dc back to SNF after UA if patient remains well appearing.   _________________________ 3:08 PM on 10/29/2016 -----------------------------------------  UA positive for UTI. Patient will be given a dose of Rocephin and will be discharged back to her facility on Keflex.   Pertinent labs & imaging results that were available during my care of the patient were reviewed by me and considered in my medical decision making (see chart for details).    ____________________________________________   FINAL CLINICAL IMPRESSION(S) / ED DIAGNOSES  Final diagnoses:  Dehydration  Nausea vomiting and diarrhea  Acute cystitis without hematuria      NEW MEDICATIONS STARTED DURING THIS VISIT:  New Prescriptions   CEPHALEXIN (KEFLEX) 500 MG CAPSULE    Take 1 capsule (500 mg total) by mouth 3 (three) times daily.   ONDANSETRON (ZOFRAN) 4 MG TABLET    Take 1 tablet (4 mg total) by mouth every 8 (eight) hours as needed for nausea or vomiting.     Note:  This document was prepared using Dragon voice recognition software and may include unintentional dictation errors.    Alfred Levins, [place], MD 10/29/16 [phone]

## 2016-10-29 NOTE — ED Notes (Addendum)
Pt clothing and linens changed and stool sample collected. Attempted to ambulate pt, but she is unable to walk. She did not know what to do with the walker.   Spoke with Thayer Headings at [facility]; she states pt does walk with a walker but that she is blind and needs help to know what to do. She also has cancer, which is in her lungs. Pt is normally confused, but she seems more confused today, and needed a wheelchair versus the walker.

## 2016-10-29 NOTE — ED Notes (Signed)
Hooked patient up to monitor. 

## 2016-10-29 NOTE — ED Notes (Signed)
Patient ambulated with 2 person assist. Patient is demented unsure if she uses assistive devices at facility. Patient denies using walker.

## 2016-10-29 NOTE — ED Triage Notes (Signed)
She arrives today via [facility] from [place]  EMS reports pt with weakness, nausea and vomiting for the past two days

## 2016-10-29 NOTE — ED Notes (Signed)
Pt provided a ginger ale with 3 saltine and PB crackers

## 2016-10-29 NOTE — ED Notes (Signed)
Changed patient and linens.

## 2016-10-29 NOTE — ED Notes (Signed)
Patient drinking water. Denies any nausea.

## 2016-10-29 NOTE — Discharge Instructions (Signed)

## 2016-10-29 NOTE — ED Notes (Signed)
Patient being discharged back to [facility].

## 2016-10-29 NOTE — ED Notes (Signed)
I could hear the charge nurse in the hallway - pt ate 2 crackers and then she got choked on the third pt moved into room26 -

## 2016-10-30 DIAGNOSIS — R296 Repeated falls: Secondary | ICD-10-CM | POA: Diagnosis not present

## 2016-10-30 DIAGNOSIS — G309 Alzheimer's disease, unspecified: Secondary | ICD-10-CM | POA: Diagnosis not present

## 2016-10-30 DIAGNOSIS — R2689 Other abnormalities of gait and mobility: Secondary | ICD-10-CM | POA: Diagnosis not present

## 2016-10-30 DIAGNOSIS — I679 Cerebrovascular disease, unspecified: Secondary | ICD-10-CM | POA: Diagnosis not present

## 2016-10-30 DIAGNOSIS — I10 Essential (primary) hypertension: Secondary | ICD-10-CM | POA: Diagnosis not present

## 2016-10-30 DIAGNOSIS — R1310 Dysphagia, unspecified: Secondary | ICD-10-CM | POA: Diagnosis not present

## 2016-10-30 DIAGNOSIS — G933 Postviral fatigue syndrome: Secondary | ICD-10-CM | POA: Diagnosis not present

## 2016-10-30 DIAGNOSIS — E785 Hyperlipidemia, unspecified: Secondary | ICD-10-CM | POA: Diagnosis not present

## 2016-10-30 DIAGNOSIS — F0280 Dementia in other diseases classified elsewhere without behavioral disturbance: Secondary | ICD-10-CM | POA: Diagnosis not present

## 2016-10-30 DIAGNOSIS — F0150 Vascular dementia without behavioral disturbance: Secondary | ICD-10-CM | POA: Diagnosis not present

## 2016-10-31 DIAGNOSIS — G933 Postviral fatigue syndrome: Secondary | ICD-10-CM | POA: Diagnosis not present

## 2016-10-31 LAB — URINE CULTURE

## 2016-11-01 DIAGNOSIS — G933 Postviral fatigue syndrome: Secondary | ICD-10-CM | POA: Diagnosis not present

## 2016-11-02 DIAGNOSIS — G933 Postviral fatigue syndrome: Secondary | ICD-10-CM | POA: Diagnosis not present

## 2016-11-03 DIAGNOSIS — R2689 Other abnormalities of gait and mobility: Secondary | ICD-10-CM | POA: Diagnosis not present

## 2016-11-03 DIAGNOSIS — H3530 Unspecified macular degeneration: Secondary | ICD-10-CM | POA: Diagnosis not present

## 2016-11-03 DIAGNOSIS — F0280 Dementia in other diseases classified elsewhere without behavioral disturbance: Secondary | ICD-10-CM | POA: Diagnosis not present

## 2016-11-03 DIAGNOSIS — I679 Cerebrovascular disease, unspecified: Secondary | ICD-10-CM | POA: Diagnosis not present

## 2016-11-03 DIAGNOSIS — I10 Essential (primary) hypertension: Secondary | ICD-10-CM | POA: Diagnosis not present

## 2016-11-03 DIAGNOSIS — G309 Alzheimer's disease, unspecified: Secondary | ICD-10-CM | POA: Diagnosis not present

## 2016-11-03 DIAGNOSIS — R296 Repeated falls: Secondary | ICD-10-CM | POA: Diagnosis not present

## 2016-11-03 DIAGNOSIS — G933 Postviral fatigue syndrome: Secondary | ICD-10-CM | POA: Diagnosis not present

## 2016-11-03 DIAGNOSIS — R1310 Dysphagia, unspecified: Secondary | ICD-10-CM | POA: Diagnosis not present

## 2016-11-03 DIAGNOSIS — F0150 Vascular dementia without behavioral disturbance: Secondary | ICD-10-CM | POA: Diagnosis not present

## 2016-11-04 DIAGNOSIS — G933 Postviral fatigue syndrome: Secondary | ICD-10-CM | POA: Diagnosis not present

## 2016-11-05 DIAGNOSIS — G933 Postviral fatigue syndrome: Secondary | ICD-10-CM | POA: Diagnosis not present

## 2016-11-06 DIAGNOSIS — I10 Essential (primary) hypertension: Secondary | ICD-10-CM | POA: Diagnosis not present

## 2016-11-06 DIAGNOSIS — R296 Repeated falls: Secondary | ICD-10-CM | POA: Diagnosis not present

## 2016-11-06 DIAGNOSIS — R1310 Dysphagia, unspecified: Secondary | ICD-10-CM | POA: Diagnosis not present

## 2016-11-06 DIAGNOSIS — G309 Alzheimer's disease, unspecified: Secondary | ICD-10-CM | POA: Diagnosis not present

## 2016-11-06 DIAGNOSIS — I679 Cerebrovascular disease, unspecified: Secondary | ICD-10-CM | POA: Diagnosis not present

## 2016-11-06 DIAGNOSIS — H3530 Unspecified macular degeneration: Secondary | ICD-10-CM | POA: Diagnosis not present

## 2016-11-06 DIAGNOSIS — F0280 Dementia in other diseases classified elsewhere without behavioral disturbance: Secondary | ICD-10-CM | POA: Diagnosis not present

## 2016-11-06 DIAGNOSIS — R2689 Other abnormalities of gait and mobility: Secondary | ICD-10-CM | POA: Diagnosis not present

## 2016-11-06 DIAGNOSIS — G933 Postviral fatigue syndrome: Secondary | ICD-10-CM | POA: Diagnosis not present

## 2016-11-06 DIAGNOSIS — F0150 Vascular dementia without behavioral disturbance: Secondary | ICD-10-CM | POA: Diagnosis not present

## 2016-11-07 DIAGNOSIS — G933 Postviral fatigue syndrome: Secondary | ICD-10-CM | POA: Diagnosis not present

## 2016-11-08 DIAGNOSIS — G933 Postviral fatigue syndrome: Secondary | ICD-10-CM | POA: Diagnosis not present

## 2016-11-09 DIAGNOSIS — G933 Postviral fatigue syndrome: Secondary | ICD-10-CM | POA: Diagnosis not present

## 2016-11-10 DIAGNOSIS — G933 Postviral fatigue syndrome: Secondary | ICD-10-CM | POA: Diagnosis not present

## 2016-11-11 DIAGNOSIS — I6789 Other cerebrovascular disease: Secondary | ICD-10-CM | POA: Diagnosis not present

## 2016-11-11 DIAGNOSIS — G933 Postviral fatigue syndrome: Secondary | ICD-10-CM | POA: Diagnosis not present

## 2016-11-11 DIAGNOSIS — G309 Alzheimer's disease, unspecified: Secondary | ICD-10-CM | POA: Diagnosis not present

## 2016-11-11 DIAGNOSIS — F0150 Vascular dementia without behavioral disturbance: Secondary | ICD-10-CM | POA: Diagnosis not present

## 2016-11-11 DIAGNOSIS — F0280 Dementia in other diseases classified elsewhere without behavioral disturbance: Secondary | ICD-10-CM | POA: Diagnosis not present

## 2016-11-12 DIAGNOSIS — I10 Essential (primary) hypertension: Secondary | ICD-10-CM | POA: Diagnosis not present

## 2016-11-12 DIAGNOSIS — G933 Postviral fatigue syndrome: Secondary | ICD-10-CM | POA: Diagnosis not present

## 2016-11-12 DIAGNOSIS — F0151 Vascular dementia with behavioral disturbance: Secondary | ICD-10-CM | POA: Diagnosis not present

## 2016-11-12 DIAGNOSIS — F0150 Vascular dementia without behavioral disturbance: Secondary | ICD-10-CM | POA: Diagnosis not present

## 2016-11-12 DIAGNOSIS — H3530 Unspecified macular degeneration: Secondary | ICD-10-CM | POA: Diagnosis not present

## 2016-11-12 DIAGNOSIS — R2689 Other abnormalities of gait and mobility: Secondary | ICD-10-CM | POA: Diagnosis not present

## 2016-11-12 DIAGNOSIS — G309 Alzheimer's disease, unspecified: Secondary | ICD-10-CM | POA: Diagnosis not present

## 2016-11-12 DIAGNOSIS — F0280 Dementia in other diseases classified elsewhere without behavioral disturbance: Secondary | ICD-10-CM | POA: Diagnosis not present

## 2016-11-12 DIAGNOSIS — I679 Cerebrovascular disease, unspecified: Secondary | ICD-10-CM | POA: Diagnosis not present

## 2016-11-12 DIAGNOSIS — R32 Unspecified urinary incontinence: Secondary | ICD-10-CM | POA: Diagnosis not present

## 2016-11-12 DIAGNOSIS — R1310 Dysphagia, unspecified: Secondary | ICD-10-CM | POA: Diagnosis not present

## 2016-11-12 DIAGNOSIS — F0390 Unspecified dementia without behavioral disturbance: Secondary | ICD-10-CM | POA: Diagnosis not present

## 2016-11-12 DIAGNOSIS — R296 Repeated falls: Secondary | ICD-10-CM | POA: Diagnosis not present

## 2016-11-13 DIAGNOSIS — R296 Repeated falls: Secondary | ICD-10-CM | POA: Diagnosis not present

## 2016-11-13 DIAGNOSIS — F0150 Vascular dementia without behavioral disturbance: Secondary | ICD-10-CM | POA: Diagnosis not present

## 2016-11-13 DIAGNOSIS — G309 Alzheimer's disease, unspecified: Secondary | ICD-10-CM | POA: Diagnosis not present

## 2016-11-13 DIAGNOSIS — R1310 Dysphagia, unspecified: Secondary | ICD-10-CM | POA: Diagnosis not present

## 2016-11-13 DIAGNOSIS — G933 Postviral fatigue syndrome: Secondary | ICD-10-CM | POA: Diagnosis not present

## 2016-11-13 DIAGNOSIS — F0280 Dementia in other diseases classified elsewhere without behavioral disturbance: Secondary | ICD-10-CM | POA: Diagnosis not present

## 2016-11-13 DIAGNOSIS — R2689 Other abnormalities of gait and mobility: Secondary | ICD-10-CM | POA: Diagnosis not present

## 2016-11-13 DIAGNOSIS — I10 Essential (primary) hypertension: Secondary | ICD-10-CM | POA: Diagnosis not present

## 2016-11-13 DIAGNOSIS — I679 Cerebrovascular disease, unspecified: Secondary | ICD-10-CM | POA: Diagnosis not present

## 2016-11-13 DIAGNOSIS — H3530 Unspecified macular degeneration: Secondary | ICD-10-CM | POA: Diagnosis not present

## 2016-11-14 DIAGNOSIS — G933 Postviral fatigue syndrome: Secondary | ICD-10-CM | POA: Diagnosis not present

## 2016-11-15 DIAGNOSIS — G933 Postviral fatigue syndrome: Secondary | ICD-10-CM | POA: Diagnosis not present

## 2016-11-16 DIAGNOSIS — G933 Postviral fatigue syndrome: Secondary | ICD-10-CM | POA: Diagnosis not present

## 2016-11-17 DIAGNOSIS — G309 Alzheimer's disease, unspecified: Secondary | ICD-10-CM | POA: Diagnosis not present

## 2016-11-17 DIAGNOSIS — F0150 Vascular dementia without behavioral disturbance: Secondary | ICD-10-CM | POA: Diagnosis not present

## 2016-11-17 DIAGNOSIS — I10 Essential (primary) hypertension: Secondary | ICD-10-CM | POA: Diagnosis not present

## 2016-11-17 DIAGNOSIS — M6281 Muscle weakness (generalized): Secondary | ICD-10-CM | POA: Diagnosis not present

## 2016-11-17 DIAGNOSIS — H3530 Unspecified macular degeneration: Secondary | ICD-10-CM | POA: Diagnosis not present

## 2016-11-17 DIAGNOSIS — F0280 Dementia in other diseases classified elsewhere without behavioral disturbance: Secondary | ICD-10-CM | POA: Diagnosis not present

## 2016-11-17 DIAGNOSIS — R296 Repeated falls: Secondary | ICD-10-CM | POA: Diagnosis not present

## 2016-11-17 DIAGNOSIS — I679 Cerebrovascular disease, unspecified: Secondary | ICD-10-CM | POA: Diagnosis not present

## 2016-11-17 DIAGNOSIS — G933 Postviral fatigue syndrome: Secondary | ICD-10-CM | POA: Diagnosis not present

## 2016-11-17 DIAGNOSIS — R1310 Dysphagia, unspecified: Secondary | ICD-10-CM | POA: Diagnosis not present

## 2016-11-17 DIAGNOSIS — R2689 Other abnormalities of gait and mobility: Secondary | ICD-10-CM | POA: Diagnosis not present

## 2016-11-18 DIAGNOSIS — Z79899 Other long term (current) drug therapy: Secondary | ICD-10-CM | POA: Diagnosis not present

## 2016-11-18 DIAGNOSIS — G933 Postviral fatigue syndrome: Secondary | ICD-10-CM | POA: Diagnosis not present

## 2016-11-19 DIAGNOSIS — R1310 Dysphagia, unspecified: Secondary | ICD-10-CM | POA: Diagnosis not present

## 2016-11-19 DIAGNOSIS — I10 Essential (primary) hypertension: Secondary | ICD-10-CM | POA: Diagnosis not present

## 2016-11-19 DIAGNOSIS — G933 Postviral fatigue syndrome: Secondary | ICD-10-CM | POA: Diagnosis not present

## 2016-11-19 DIAGNOSIS — G309 Alzheimer's disease, unspecified: Secondary | ICD-10-CM | POA: Diagnosis not present

## 2016-11-19 DIAGNOSIS — F0150 Vascular dementia without behavioral disturbance: Secondary | ICD-10-CM | POA: Diagnosis not present

## 2016-11-19 DIAGNOSIS — R296 Repeated falls: Secondary | ICD-10-CM | POA: Diagnosis not present

## 2016-11-19 DIAGNOSIS — H3530 Unspecified macular degeneration: Secondary | ICD-10-CM | POA: Diagnosis not present

## 2016-11-19 DIAGNOSIS — F0280 Dementia in other diseases classified elsewhere without behavioral disturbance: Secondary | ICD-10-CM | POA: Diagnosis not present

## 2016-11-19 DIAGNOSIS — I679 Cerebrovascular disease, unspecified: Secondary | ICD-10-CM | POA: Diagnosis not present

## 2016-11-19 DIAGNOSIS — R2689 Other abnormalities of gait and mobility: Secondary | ICD-10-CM | POA: Diagnosis not present

## 2016-11-20 DIAGNOSIS — G933 Postviral fatigue syndrome: Secondary | ICD-10-CM | POA: Diagnosis not present

## 2016-11-21 DIAGNOSIS — G933 Postviral fatigue syndrome: Secondary | ICD-10-CM | POA: Diagnosis not present

## 2016-11-22 DIAGNOSIS — G933 Postviral fatigue syndrome: Secondary | ICD-10-CM | POA: Diagnosis not present

## 2016-11-23 DIAGNOSIS — G933 Postviral fatigue syndrome: Secondary | ICD-10-CM | POA: Diagnosis not present

## 2016-11-24 DIAGNOSIS — F0280 Dementia in other diseases classified elsewhere without behavioral disturbance: Secondary | ICD-10-CM | POA: Diagnosis not present

## 2016-11-24 DIAGNOSIS — G933 Postviral fatigue syndrome: Secondary | ICD-10-CM | POA: Diagnosis not present

## 2016-11-24 DIAGNOSIS — R296 Repeated falls: Secondary | ICD-10-CM | POA: Diagnosis not present

## 2016-11-24 DIAGNOSIS — H3530 Unspecified macular degeneration: Secondary | ICD-10-CM | POA: Diagnosis not present

## 2016-11-24 DIAGNOSIS — F0150 Vascular dementia without behavioral disturbance: Secondary | ICD-10-CM | POA: Diagnosis not present

## 2016-11-24 DIAGNOSIS — I679 Cerebrovascular disease, unspecified: Secondary | ICD-10-CM | POA: Diagnosis not present

## 2016-11-24 DIAGNOSIS — R2689 Other abnormalities of gait and mobility: Secondary | ICD-10-CM | POA: Diagnosis not present

## 2016-11-24 DIAGNOSIS — R1310 Dysphagia, unspecified: Secondary | ICD-10-CM | POA: Diagnosis not present

## 2016-11-24 DIAGNOSIS — G309 Alzheimer's disease, unspecified: Secondary | ICD-10-CM | POA: Diagnosis not present

## 2016-11-24 DIAGNOSIS — I10 Essential (primary) hypertension: Secondary | ICD-10-CM | POA: Diagnosis not present

## 2016-11-25 DIAGNOSIS — F0151 Vascular dementia with behavioral disturbance: Secondary | ICD-10-CM | POA: Diagnosis not present

## 2016-11-25 DIAGNOSIS — G933 Postviral fatigue syndrome: Secondary | ICD-10-CM | POA: Diagnosis not present

## 2016-11-25 DIAGNOSIS — I10 Essential (primary) hypertension: Secondary | ICD-10-CM | POA: Diagnosis not present

## 2016-11-25 DIAGNOSIS — E785 Hyperlipidemia, unspecified: Secondary | ICD-10-CM | POA: Diagnosis not present

## 2016-11-25 DIAGNOSIS — R2681 Unsteadiness on feet: Secondary | ICD-10-CM | POA: Diagnosis not present

## 2016-11-26 DIAGNOSIS — F419 Anxiety disorder, unspecified: Secondary | ICD-10-CM | POA: Diagnosis not present

## 2016-11-26 DIAGNOSIS — I679 Cerebrovascular disease, unspecified: Secondary | ICD-10-CM | POA: Diagnosis not present

## 2016-11-26 DIAGNOSIS — R296 Repeated falls: Secondary | ICD-10-CM | POA: Diagnosis not present

## 2016-11-26 DIAGNOSIS — R2689 Other abnormalities of gait and mobility: Secondary | ICD-10-CM | POA: Diagnosis not present

## 2016-11-26 DIAGNOSIS — F0280 Dementia in other diseases classified elsewhere without behavioral disturbance: Secondary | ICD-10-CM | POA: Diagnosis not present

## 2016-11-26 DIAGNOSIS — I10 Essential (primary) hypertension: Secondary | ICD-10-CM | POA: Diagnosis not present

## 2016-11-26 DIAGNOSIS — F0151 Vascular dementia with behavioral disturbance: Secondary | ICD-10-CM | POA: Diagnosis not present

## 2016-11-26 DIAGNOSIS — G309 Alzheimer's disease, unspecified: Secondary | ICD-10-CM | POA: Diagnosis not present

## 2016-11-26 DIAGNOSIS — R1310 Dysphagia, unspecified: Secondary | ICD-10-CM | POA: Diagnosis not present

## 2016-11-26 DIAGNOSIS — F0150 Vascular dementia without behavioral disturbance: Secondary | ICD-10-CM | POA: Diagnosis not present

## 2016-11-26 DIAGNOSIS — H3530 Unspecified macular degeneration: Secondary | ICD-10-CM | POA: Diagnosis not present

## 2016-11-26 DIAGNOSIS — G933 Postviral fatigue syndrome: Secondary | ICD-10-CM | POA: Diagnosis not present

## 2016-11-27 DIAGNOSIS — G933 Postviral fatigue syndrome: Secondary | ICD-10-CM | POA: Diagnosis not present

## 2016-11-28 DIAGNOSIS — G933 Postviral fatigue syndrome: Secondary | ICD-10-CM | POA: Diagnosis not present

## 2016-11-29 DIAGNOSIS — G933 Postviral fatigue syndrome: Secondary | ICD-10-CM | POA: Diagnosis not present

## 2016-11-30 DIAGNOSIS — I679 Cerebrovascular disease, unspecified: Secondary | ICD-10-CM | POA: Diagnosis not present

## 2016-11-30 DIAGNOSIS — G933 Postviral fatigue syndrome: Secondary | ICD-10-CM | POA: Diagnosis not present

## 2016-11-30 DIAGNOSIS — H3530 Unspecified macular degeneration: Secondary | ICD-10-CM | POA: Diagnosis not present

## 2016-11-30 DIAGNOSIS — I10 Essential (primary) hypertension: Secondary | ICD-10-CM | POA: Diagnosis not present

## 2016-11-30 DIAGNOSIS — R1310 Dysphagia, unspecified: Secondary | ICD-10-CM | POA: Diagnosis not present

## 2016-11-30 DIAGNOSIS — R2689 Other abnormalities of gait and mobility: Secondary | ICD-10-CM | POA: Diagnosis not present

## 2016-11-30 DIAGNOSIS — F0150 Vascular dementia without behavioral disturbance: Secondary | ICD-10-CM | POA: Diagnosis not present

## 2016-11-30 DIAGNOSIS — F0280 Dementia in other diseases classified elsewhere without behavioral disturbance: Secondary | ICD-10-CM | POA: Diagnosis not present

## 2016-11-30 DIAGNOSIS — G309 Alzheimer's disease, unspecified: Secondary | ICD-10-CM | POA: Diagnosis not present

## 2016-11-30 DIAGNOSIS — R296 Repeated falls: Secondary | ICD-10-CM | POA: Diagnosis not present

## 2016-12-01 DIAGNOSIS — R1310 Dysphagia, unspecified: Secondary | ICD-10-CM | POA: Diagnosis not present

## 2016-12-01 DIAGNOSIS — H3530 Unspecified macular degeneration: Secondary | ICD-10-CM | POA: Diagnosis not present

## 2016-12-01 DIAGNOSIS — F0280 Dementia in other diseases classified elsewhere without behavioral disturbance: Secondary | ICD-10-CM | POA: Diagnosis not present

## 2016-12-01 DIAGNOSIS — F0150 Vascular dementia without behavioral disturbance: Secondary | ICD-10-CM | POA: Diagnosis not present

## 2016-12-01 DIAGNOSIS — I10 Essential (primary) hypertension: Secondary | ICD-10-CM | POA: Diagnosis not present

## 2016-12-01 DIAGNOSIS — G933 Postviral fatigue syndrome: Secondary | ICD-10-CM | POA: Diagnosis not present

## 2016-12-01 DIAGNOSIS — R2689 Other abnormalities of gait and mobility: Secondary | ICD-10-CM | POA: Diagnosis not present

## 2016-12-01 DIAGNOSIS — G309 Alzheimer's disease, unspecified: Secondary | ICD-10-CM | POA: Diagnosis not present

## 2016-12-01 DIAGNOSIS — R296 Repeated falls: Secondary | ICD-10-CM | POA: Diagnosis not present

## 2016-12-01 DIAGNOSIS — I679 Cerebrovascular disease, unspecified: Secondary | ICD-10-CM | POA: Diagnosis not present

## 2016-12-02 DIAGNOSIS — I679 Cerebrovascular disease, unspecified: Secondary | ICD-10-CM | POA: Diagnosis not present

## 2016-12-02 DIAGNOSIS — R296 Repeated falls: Secondary | ICD-10-CM | POA: Diagnosis not present

## 2016-12-02 DIAGNOSIS — H3530 Unspecified macular degeneration: Secondary | ICD-10-CM | POA: Diagnosis not present

## 2016-12-02 DIAGNOSIS — G309 Alzheimer's disease, unspecified: Secondary | ICD-10-CM | POA: Diagnosis not present

## 2016-12-02 DIAGNOSIS — F0280 Dementia in other diseases classified elsewhere without behavioral disturbance: Secondary | ICD-10-CM | POA: Diagnosis not present

## 2016-12-02 DIAGNOSIS — F0150 Vascular dementia without behavioral disturbance: Secondary | ICD-10-CM | POA: Diagnosis not present

## 2016-12-02 DIAGNOSIS — R1310 Dysphagia, unspecified: Secondary | ICD-10-CM | POA: Diagnosis not present

## 2016-12-02 DIAGNOSIS — I10 Essential (primary) hypertension: Secondary | ICD-10-CM | POA: Diagnosis not present

## 2016-12-02 DIAGNOSIS — R2689 Other abnormalities of gait and mobility: Secondary | ICD-10-CM | POA: Diagnosis not present

## 2016-12-02 DIAGNOSIS — G933 Postviral fatigue syndrome: Secondary | ICD-10-CM | POA: Diagnosis not present

## 2016-12-03 DIAGNOSIS — G933 Postviral fatigue syndrome: Secondary | ICD-10-CM | POA: Diagnosis not present

## 2016-12-04 DIAGNOSIS — G933 Postviral fatigue syndrome: Secondary | ICD-10-CM | POA: Diagnosis not present

## 2016-12-05 DIAGNOSIS — G933 Postviral fatigue syndrome: Secondary | ICD-10-CM | POA: Diagnosis not present

## 2016-12-06 DIAGNOSIS — G933 Postviral fatigue syndrome: Secondary | ICD-10-CM | POA: Diagnosis not present

## 2016-12-07 DIAGNOSIS — G933 Postviral fatigue syndrome: Secondary | ICD-10-CM | POA: Diagnosis not present

## 2016-12-08 DIAGNOSIS — I679 Cerebrovascular disease, unspecified: Secondary | ICD-10-CM | POA: Diagnosis not present

## 2016-12-08 DIAGNOSIS — F0280 Dementia in other diseases classified elsewhere without behavioral disturbance: Secondary | ICD-10-CM | POA: Diagnosis not present

## 2016-12-08 DIAGNOSIS — G309 Alzheimer's disease, unspecified: Secondary | ICD-10-CM | POA: Diagnosis not present

## 2016-12-08 DIAGNOSIS — R296 Repeated falls: Secondary | ICD-10-CM | POA: Diagnosis not present

## 2016-12-08 DIAGNOSIS — H3530 Unspecified macular degeneration: Secondary | ICD-10-CM | POA: Diagnosis not present

## 2016-12-08 DIAGNOSIS — I10 Essential (primary) hypertension: Secondary | ICD-10-CM | POA: Diagnosis not present

## 2016-12-08 DIAGNOSIS — G933 Postviral fatigue syndrome: Secondary | ICD-10-CM | POA: Diagnosis not present

## 2016-12-08 DIAGNOSIS — R1310 Dysphagia, unspecified: Secondary | ICD-10-CM | POA: Diagnosis not present

## 2016-12-08 DIAGNOSIS — F0150 Vascular dementia without behavioral disturbance: Secondary | ICD-10-CM | POA: Diagnosis not present

## 2016-12-08 DIAGNOSIS — R2689 Other abnormalities of gait and mobility: Secondary | ICD-10-CM | POA: Diagnosis not present

## 2016-12-09 DIAGNOSIS — R2689 Other abnormalities of gait and mobility: Secondary | ICD-10-CM | POA: Diagnosis not present

## 2016-12-09 DIAGNOSIS — G933 Postviral fatigue syndrome: Secondary | ICD-10-CM | POA: Diagnosis not present

## 2016-12-09 DIAGNOSIS — G309 Alzheimer's disease, unspecified: Secondary | ICD-10-CM | POA: Diagnosis not present

## 2016-12-09 DIAGNOSIS — F0150 Vascular dementia without behavioral disturbance: Secondary | ICD-10-CM | POA: Diagnosis not present

## 2016-12-09 DIAGNOSIS — F0280 Dementia in other diseases classified elsewhere without behavioral disturbance: Secondary | ICD-10-CM | POA: Diagnosis not present

## 2016-12-09 DIAGNOSIS — R296 Repeated falls: Secondary | ICD-10-CM | POA: Diagnosis not present

## 2016-12-09 DIAGNOSIS — H3530 Unspecified macular degeneration: Secondary | ICD-10-CM | POA: Diagnosis not present

## 2016-12-09 DIAGNOSIS — I679 Cerebrovascular disease, unspecified: Secondary | ICD-10-CM | POA: Diagnosis not present

## 2016-12-09 DIAGNOSIS — R1310 Dysphagia, unspecified: Secondary | ICD-10-CM | POA: Diagnosis not present

## 2016-12-09 DIAGNOSIS — I10 Essential (primary) hypertension: Secondary | ICD-10-CM | POA: Diagnosis not present

## 2016-12-10 DIAGNOSIS — G933 Postviral fatigue syndrome: Secondary | ICD-10-CM | POA: Diagnosis not present

## 2016-12-11 DIAGNOSIS — F0390 Unspecified dementia without behavioral disturbance: Secondary | ICD-10-CM | POA: Diagnosis not present

## 2016-12-11 DIAGNOSIS — R32 Unspecified urinary incontinence: Secondary | ICD-10-CM | POA: Diagnosis not present

## 2016-12-11 DIAGNOSIS — G933 Postviral fatigue syndrome: Secondary | ICD-10-CM | POA: Diagnosis not present

## 2016-12-12 DIAGNOSIS — R296 Repeated falls: Secondary | ICD-10-CM | POA: Diagnosis not present

## 2016-12-12 DIAGNOSIS — G309 Alzheimer's disease, unspecified: Secondary | ICD-10-CM | POA: Diagnosis not present

## 2016-12-12 DIAGNOSIS — R2689 Other abnormalities of gait and mobility: Secondary | ICD-10-CM | POA: Diagnosis not present

## 2016-12-12 DIAGNOSIS — I679 Cerebrovascular disease, unspecified: Secondary | ICD-10-CM | POA: Diagnosis not present

## 2016-12-12 DIAGNOSIS — I10 Essential (primary) hypertension: Secondary | ICD-10-CM | POA: Diagnosis not present

## 2016-12-12 DIAGNOSIS — F0150 Vascular dementia without behavioral disturbance: Secondary | ICD-10-CM | POA: Diagnosis not present

## 2016-12-12 DIAGNOSIS — H3530 Unspecified macular degeneration: Secondary | ICD-10-CM | POA: Diagnosis not present

## 2016-12-12 DIAGNOSIS — F0280 Dementia in other diseases classified elsewhere without behavioral disturbance: Secondary | ICD-10-CM | POA: Diagnosis not present

## 2016-12-12 DIAGNOSIS — R1310 Dysphagia, unspecified: Secondary | ICD-10-CM | POA: Diagnosis not present

## 2016-12-12 DIAGNOSIS — G933 Postviral fatigue syndrome: Secondary | ICD-10-CM | POA: Diagnosis not present

## 2016-12-13 DIAGNOSIS — G933 Postviral fatigue syndrome: Secondary | ICD-10-CM | POA: Diagnosis not present

## 2016-12-14 DIAGNOSIS — G933 Postviral fatigue syndrome: Secondary | ICD-10-CM | POA: Diagnosis not present

## 2016-12-15 DIAGNOSIS — R2689 Other abnormalities of gait and mobility: Secondary | ICD-10-CM | POA: Diagnosis not present

## 2016-12-15 DIAGNOSIS — H3530 Unspecified macular degeneration: Secondary | ICD-10-CM | POA: Diagnosis not present

## 2016-12-15 DIAGNOSIS — F0280 Dementia in other diseases classified elsewhere without behavioral disturbance: Secondary | ICD-10-CM | POA: Diagnosis not present

## 2016-12-15 DIAGNOSIS — I10 Essential (primary) hypertension: Secondary | ICD-10-CM | POA: Diagnosis not present

## 2016-12-15 DIAGNOSIS — G309 Alzheimer's disease, unspecified: Secondary | ICD-10-CM | POA: Diagnosis not present

## 2016-12-15 DIAGNOSIS — F0151 Vascular dementia with behavioral disturbance: Secondary | ICD-10-CM | POA: Diagnosis not present

## 2016-12-15 DIAGNOSIS — G933 Postviral fatigue syndrome: Secondary | ICD-10-CM | POA: Diagnosis not present

## 2016-12-15 DIAGNOSIS — F0150 Vascular dementia without behavioral disturbance: Secondary | ICD-10-CM | POA: Diagnosis not present

## 2016-12-15 DIAGNOSIS — R296 Repeated falls: Secondary | ICD-10-CM | POA: Diagnosis not present

## 2016-12-15 DIAGNOSIS — R1310 Dysphagia, unspecified: Secondary | ICD-10-CM | POA: Diagnosis not present

## 2016-12-15 DIAGNOSIS — I679 Cerebrovascular disease, unspecified: Secondary | ICD-10-CM | POA: Diagnosis not present

## 2016-12-16 DIAGNOSIS — R296 Repeated falls: Secondary | ICD-10-CM | POA: Diagnosis not present

## 2016-12-16 DIAGNOSIS — I10 Essential (primary) hypertension: Secondary | ICD-10-CM | POA: Diagnosis not present

## 2016-12-16 DIAGNOSIS — R1310 Dysphagia, unspecified: Secondary | ICD-10-CM | POA: Diagnosis not present

## 2016-12-16 DIAGNOSIS — F0280 Dementia in other diseases classified elsewhere without behavioral disturbance: Secondary | ICD-10-CM | POA: Diagnosis not present

## 2016-12-16 DIAGNOSIS — F0150 Vascular dementia without behavioral disturbance: Secondary | ICD-10-CM | POA: Diagnosis not present

## 2016-12-16 DIAGNOSIS — G309 Alzheimer's disease, unspecified: Secondary | ICD-10-CM | POA: Diagnosis not present

## 2016-12-16 DIAGNOSIS — I679 Cerebrovascular disease, unspecified: Secondary | ICD-10-CM | POA: Diagnosis not present

## 2016-12-16 DIAGNOSIS — H3530 Unspecified macular degeneration: Secondary | ICD-10-CM | POA: Diagnosis not present

## 2016-12-16 DIAGNOSIS — R2689 Other abnormalities of gait and mobility: Secondary | ICD-10-CM | POA: Diagnosis not present

## 2016-12-16 DIAGNOSIS — G933 Postviral fatigue syndrome: Secondary | ICD-10-CM | POA: Diagnosis not present

## 2016-12-17 DIAGNOSIS — G309 Alzheimer's disease, unspecified: Secondary | ICD-10-CM | POA: Diagnosis not present

## 2016-12-17 DIAGNOSIS — F0150 Vascular dementia without behavioral disturbance: Secondary | ICD-10-CM | POA: Diagnosis not present

## 2016-12-17 DIAGNOSIS — R296 Repeated falls: Secondary | ICD-10-CM | POA: Diagnosis not present

## 2016-12-17 DIAGNOSIS — H3530 Unspecified macular degeneration: Secondary | ICD-10-CM | POA: Diagnosis not present

## 2016-12-17 DIAGNOSIS — R1310 Dysphagia, unspecified: Secondary | ICD-10-CM | POA: Diagnosis not present

## 2016-12-17 DIAGNOSIS — I10 Essential (primary) hypertension: Secondary | ICD-10-CM | POA: Diagnosis not present

## 2016-12-17 DIAGNOSIS — F0280 Dementia in other diseases classified elsewhere without behavioral disturbance: Secondary | ICD-10-CM | POA: Diagnosis not present

## 2016-12-17 DIAGNOSIS — I679 Cerebrovascular disease, unspecified: Secondary | ICD-10-CM | POA: Diagnosis not present

## 2016-12-17 DIAGNOSIS — G933 Postviral fatigue syndrome: Secondary | ICD-10-CM | POA: Diagnosis not present

## 2016-12-17 DIAGNOSIS — R2689 Other abnormalities of gait and mobility: Secondary | ICD-10-CM | POA: Diagnosis not present

## 2016-12-18 DIAGNOSIS — R1310 Dysphagia, unspecified: Secondary | ICD-10-CM | POA: Diagnosis not present

## 2016-12-18 DIAGNOSIS — G933 Postviral fatigue syndrome: Secondary | ICD-10-CM | POA: Diagnosis not present

## 2016-12-18 DIAGNOSIS — R2689 Other abnormalities of gait and mobility: Secondary | ICD-10-CM | POA: Diagnosis not present

## 2016-12-18 DIAGNOSIS — F0280 Dementia in other diseases classified elsewhere without behavioral disturbance: Secondary | ICD-10-CM | POA: Diagnosis not present

## 2016-12-18 DIAGNOSIS — F0150 Vascular dementia without behavioral disturbance: Secondary | ICD-10-CM | POA: Diagnosis not present

## 2016-12-18 DIAGNOSIS — G309 Alzheimer's disease, unspecified: Secondary | ICD-10-CM | POA: Diagnosis not present

## 2016-12-18 DIAGNOSIS — R296 Repeated falls: Secondary | ICD-10-CM | POA: Diagnosis not present

## 2016-12-18 DIAGNOSIS — I679 Cerebrovascular disease, unspecified: Secondary | ICD-10-CM | POA: Diagnosis not present

## 2016-12-18 DIAGNOSIS — H3530 Unspecified macular degeneration: Secondary | ICD-10-CM | POA: Diagnosis not present

## 2016-12-18 DIAGNOSIS — I10 Essential (primary) hypertension: Secondary | ICD-10-CM | POA: Diagnosis not present

## 2016-12-19 DIAGNOSIS — G933 Postviral fatigue syndrome: Secondary | ICD-10-CM | POA: Diagnosis not present

## 2016-12-20 DIAGNOSIS — G933 Postviral fatigue syndrome: Secondary | ICD-10-CM | POA: Diagnosis not present

## 2016-12-21 DIAGNOSIS — G933 Postviral fatigue syndrome: Secondary | ICD-10-CM | POA: Diagnosis not present

## 2016-12-22 DIAGNOSIS — H3530 Unspecified macular degeneration: Secondary | ICD-10-CM | POA: Diagnosis not present

## 2016-12-22 DIAGNOSIS — I679 Cerebrovascular disease, unspecified: Secondary | ICD-10-CM | POA: Diagnosis not present

## 2016-12-22 DIAGNOSIS — I10 Essential (primary) hypertension: Secondary | ICD-10-CM | POA: Diagnosis not present

## 2016-12-22 DIAGNOSIS — R296 Repeated falls: Secondary | ICD-10-CM | POA: Diagnosis not present

## 2016-12-22 DIAGNOSIS — G933 Postviral fatigue syndrome: Secondary | ICD-10-CM | POA: Diagnosis not present

## 2016-12-22 DIAGNOSIS — R1310 Dysphagia, unspecified: Secondary | ICD-10-CM | POA: Diagnosis not present

## 2016-12-22 DIAGNOSIS — F0150 Vascular dementia without behavioral disturbance: Secondary | ICD-10-CM | POA: Diagnosis not present

## 2016-12-22 DIAGNOSIS — G309 Alzheimer's disease, unspecified: Secondary | ICD-10-CM | POA: Diagnosis not present

## 2016-12-22 DIAGNOSIS — R2689 Other abnormalities of gait and mobility: Secondary | ICD-10-CM | POA: Diagnosis not present

## 2016-12-22 DIAGNOSIS — F0280 Dementia in other diseases classified elsewhere without behavioral disturbance: Secondary | ICD-10-CM | POA: Diagnosis not present

## 2016-12-23 DIAGNOSIS — E785 Hyperlipidemia, unspecified: Secondary | ICD-10-CM | POA: Diagnosis not present

## 2016-12-23 DIAGNOSIS — I10 Essential (primary) hypertension: Secondary | ICD-10-CM | POA: Diagnosis not present

## 2016-12-23 DIAGNOSIS — F0151 Vascular dementia with behavioral disturbance: Secondary | ICD-10-CM | POA: Diagnosis not present

## 2016-12-23 DIAGNOSIS — G933 Postviral fatigue syndrome: Secondary | ICD-10-CM | POA: Diagnosis not present

## 2016-12-23 DIAGNOSIS — G309 Alzheimer's disease, unspecified: Secondary | ICD-10-CM | POA: Diagnosis not present

## 2016-12-24 DIAGNOSIS — F0391 Unspecified dementia with behavioral disturbance: Secondary | ICD-10-CM | POA: Diagnosis not present

## 2016-12-24 DIAGNOSIS — G933 Postviral fatigue syndrome: Secondary | ICD-10-CM | POA: Diagnosis not present

## 2016-12-24 DIAGNOSIS — G309 Alzheimer's disease, unspecified: Secondary | ICD-10-CM | POA: Diagnosis not present

## 2016-12-25 DIAGNOSIS — I10 Essential (primary) hypertension: Secondary | ICD-10-CM | POA: Diagnosis not present

## 2016-12-25 DIAGNOSIS — R2689 Other abnormalities of gait and mobility: Secondary | ICD-10-CM | POA: Diagnosis not present

## 2016-12-25 DIAGNOSIS — G309 Alzheimer's disease, unspecified: Secondary | ICD-10-CM | POA: Diagnosis not present

## 2016-12-25 DIAGNOSIS — I679 Cerebrovascular disease, unspecified: Secondary | ICD-10-CM | POA: Diagnosis not present

## 2016-12-25 DIAGNOSIS — F0280 Dementia in other diseases classified elsewhere without behavioral disturbance: Secondary | ICD-10-CM | POA: Diagnosis not present

## 2016-12-25 DIAGNOSIS — G933 Postviral fatigue syndrome: Secondary | ICD-10-CM | POA: Diagnosis not present

## 2016-12-25 DIAGNOSIS — F0150 Vascular dementia without behavioral disturbance: Secondary | ICD-10-CM | POA: Diagnosis not present

## 2016-12-25 DIAGNOSIS — R296 Repeated falls: Secondary | ICD-10-CM | POA: Diagnosis not present

## 2016-12-25 DIAGNOSIS — H3530 Unspecified macular degeneration: Secondary | ICD-10-CM | POA: Diagnosis not present

## 2016-12-25 DIAGNOSIS — R1310 Dysphagia, unspecified: Secondary | ICD-10-CM | POA: Diagnosis not present

## 2016-12-26 DIAGNOSIS — G933 Postviral fatigue syndrome: Secondary | ICD-10-CM | POA: Diagnosis not present

## 2016-12-27 DIAGNOSIS — G933 Postviral fatigue syndrome: Secondary | ICD-10-CM | POA: Diagnosis not present

## 2016-12-28 DIAGNOSIS — G933 Postviral fatigue syndrome: Secondary | ICD-10-CM | POA: Diagnosis not present

## 2016-12-29 DIAGNOSIS — F0280 Dementia in other diseases classified elsewhere without behavioral disturbance: Secondary | ICD-10-CM | POA: Diagnosis not present

## 2016-12-29 DIAGNOSIS — I679 Cerebrovascular disease, unspecified: Secondary | ICD-10-CM | POA: Diagnosis not present

## 2016-12-29 DIAGNOSIS — R1310 Dysphagia, unspecified: Secondary | ICD-10-CM | POA: Diagnosis not present

## 2016-12-29 DIAGNOSIS — F0150 Vascular dementia without behavioral disturbance: Secondary | ICD-10-CM | POA: Diagnosis not present

## 2016-12-29 DIAGNOSIS — R2689 Other abnormalities of gait and mobility: Secondary | ICD-10-CM | POA: Diagnosis not present

## 2016-12-29 DIAGNOSIS — G309 Alzheimer's disease, unspecified: Secondary | ICD-10-CM | POA: Diagnosis not present

## 2016-12-29 DIAGNOSIS — R296 Repeated falls: Secondary | ICD-10-CM | POA: Diagnosis not present

## 2016-12-29 DIAGNOSIS — G933 Postviral fatigue syndrome: Secondary | ICD-10-CM | POA: Diagnosis not present

## 2016-12-29 DIAGNOSIS — I10 Essential (primary) hypertension: Secondary | ICD-10-CM | POA: Diagnosis not present

## 2016-12-29 DIAGNOSIS — H3530 Unspecified macular degeneration: Secondary | ICD-10-CM | POA: Diagnosis not present

## 2016-12-30 DIAGNOSIS — G933 Postviral fatigue syndrome: Secondary | ICD-10-CM | POA: Diagnosis not present

## 2016-12-31 DIAGNOSIS — G933 Postviral fatigue syndrome: Secondary | ICD-10-CM | POA: Diagnosis not present

## 2017-01-01 DIAGNOSIS — G309 Alzheimer's disease, unspecified: Secondary | ICD-10-CM | POA: Diagnosis not present

## 2017-01-01 DIAGNOSIS — R2689 Other abnormalities of gait and mobility: Secondary | ICD-10-CM | POA: Diagnosis not present

## 2017-01-01 DIAGNOSIS — I679 Cerebrovascular disease, unspecified: Secondary | ICD-10-CM | POA: Diagnosis not present

## 2017-01-01 DIAGNOSIS — G933 Postviral fatigue syndrome: Secondary | ICD-10-CM | POA: Diagnosis not present

## 2017-01-01 DIAGNOSIS — F0280 Dementia in other diseases classified elsewhere without behavioral disturbance: Secondary | ICD-10-CM | POA: Diagnosis not present

## 2017-01-01 DIAGNOSIS — R296 Repeated falls: Secondary | ICD-10-CM | POA: Diagnosis not present

## 2017-01-01 DIAGNOSIS — F0150 Vascular dementia without behavioral disturbance: Secondary | ICD-10-CM | POA: Diagnosis not present

## 2017-01-01 DIAGNOSIS — I10 Essential (primary) hypertension: Secondary | ICD-10-CM | POA: Diagnosis not present

## 2017-01-01 DIAGNOSIS — R1310 Dysphagia, unspecified: Secondary | ICD-10-CM | POA: Diagnosis not present

## 2017-01-01 DIAGNOSIS — H3530 Unspecified macular degeneration: Secondary | ICD-10-CM | POA: Diagnosis not present

## 2017-01-02 DIAGNOSIS — G933 Postviral fatigue syndrome: Secondary | ICD-10-CM | POA: Diagnosis not present

## 2017-01-03 DIAGNOSIS — G933 Postviral fatigue syndrome: Secondary | ICD-10-CM | POA: Diagnosis not present

## 2017-01-04 DIAGNOSIS — G933 Postviral fatigue syndrome: Secondary | ICD-10-CM | POA: Diagnosis not present

## 2017-01-05 DIAGNOSIS — G933 Postviral fatigue syndrome: Secondary | ICD-10-CM | POA: Diagnosis not present

## 2017-01-06 DIAGNOSIS — G933 Postviral fatigue syndrome: Secondary | ICD-10-CM | POA: Diagnosis not present

## 2017-01-07 DIAGNOSIS — F0151 Vascular dementia with behavioral disturbance: Secondary | ICD-10-CM | POA: Diagnosis not present

## 2017-01-07 DIAGNOSIS — F0391 Unspecified dementia with behavioral disturbance: Secondary | ICD-10-CM | POA: Diagnosis not present

## 2017-01-07 DIAGNOSIS — G933 Postviral fatigue syndrome: Secondary | ICD-10-CM | POA: Diagnosis not present

## 2017-01-07 DIAGNOSIS — G309 Alzheimer's disease, unspecified: Secondary | ICD-10-CM | POA: Diagnosis not present

## 2017-01-08 DIAGNOSIS — G933 Postviral fatigue syndrome: Secondary | ICD-10-CM | POA: Diagnosis not present

## 2017-01-09 DIAGNOSIS — G933 Postviral fatigue syndrome: Secondary | ICD-10-CM | POA: Diagnosis not present

## 2017-01-10 DIAGNOSIS — G933 Postviral fatigue syndrome: Secondary | ICD-10-CM | POA: Diagnosis not present

## 2017-01-11 DIAGNOSIS — G933 Postviral fatigue syndrome: Secondary | ICD-10-CM | POA: Diagnosis not present

## 2017-01-12 DIAGNOSIS — G933 Postviral fatigue syndrome: Secondary | ICD-10-CM | POA: Diagnosis not present

## 2017-01-13 DIAGNOSIS — G933 Postviral fatigue syndrome: Secondary | ICD-10-CM | POA: Diagnosis not present

## 2017-01-14 DIAGNOSIS — F0390 Unspecified dementia without behavioral disturbance: Secondary | ICD-10-CM | POA: Diagnosis not present

## 2017-01-14 DIAGNOSIS — R32 Unspecified urinary incontinence: Secondary | ICD-10-CM | POA: Diagnosis not present

## 2017-01-14 DIAGNOSIS — G933 Postviral fatigue syndrome: Secondary | ICD-10-CM | POA: Diagnosis not present

## 2017-01-15 DIAGNOSIS — G933 Postviral fatigue syndrome: Secondary | ICD-10-CM | POA: Diagnosis not present

## 2017-01-16 DIAGNOSIS — G933 Postviral fatigue syndrome: Secondary | ICD-10-CM | POA: Diagnosis not present

## 2017-01-18 ENCOUNTER — Emergency Department: Payer: Medicare HMO

## 2017-01-18 ENCOUNTER — Inpatient Hospital Stay
Admission: EM | Admit: 2017-01-18 | Discharge: 2017-01-21 | DRG: 189 | Disposition: A | Discharge status: Hospice | Payer: Medicare HMO | Attending: Internal Medicine | Admitting: Internal Medicine

## 2017-01-18 DIAGNOSIS — R05 Cough: Secondary | ICD-10-CM

## 2017-01-18 DIAGNOSIS — F0390 Unspecified dementia without behavioral disturbance: Secondary | ICD-10-CM | POA: Diagnosis not present

## 2017-01-18 DIAGNOSIS — Z8673 Personal history of transient ischemic attack (TIA), and cerebral infarction without residual deficits: Secondary | ICD-10-CM

## 2017-01-18 DIAGNOSIS — J9601 Acute respiratory failure with hypoxia: Principal | ICD-10-CM | POA: Diagnosis present

## 2017-01-18 DIAGNOSIS — H548 Legal blindness, as defined in USA: Secondary | ICD-10-CM | POA: Diagnosis not present

## 2017-01-18 DIAGNOSIS — Z7189 Other specified counseling: Secondary | ICD-10-CM | POA: Diagnosis not present

## 2017-01-18 DIAGNOSIS — L89610 Pressure ulcer of right heel, unstageable: Secondary | ICD-10-CM | POA: Diagnosis present

## 2017-01-18 DIAGNOSIS — R918 Other nonspecific abnormal finding of lung field: Secondary | ICD-10-CM | POA: Diagnosis not present

## 2017-01-18 DIAGNOSIS — H3530 Unspecified macular degeneration: Secondary | ICD-10-CM | POA: Diagnosis not present

## 2017-01-18 DIAGNOSIS — C3491 Malignant neoplasm of unspecified part of right bronchus or lung: Secondary | ICD-10-CM | POA: Diagnosis not present

## 2017-01-18 DIAGNOSIS — I509 Heart failure, unspecified: Secondary | ICD-10-CM | POA: Diagnosis not present

## 2017-01-18 DIAGNOSIS — Z87891 Personal history of nicotine dependence: Secondary | ICD-10-CM | POA: Diagnosis not present

## 2017-01-18 DIAGNOSIS — R7881 Bacteremia: Secondary | ICD-10-CM | POA: Diagnosis not present

## 2017-01-18 DIAGNOSIS — A498 Other bacterial infections of unspecified site: Secondary | ICD-10-CM | POA: Diagnosis not present

## 2017-01-18 DIAGNOSIS — L8990 Pressure ulcer of unspecified site, unspecified stage: Secondary | ICD-10-CM

## 2017-01-18 DIAGNOSIS — J209 Acute bronchitis, unspecified: Secondary | ICD-10-CM | POA: Diagnosis not present

## 2017-01-18 DIAGNOSIS — F0280 Dementia in other diseases classified elsewhere without behavioral disturbance: Secondary | ICD-10-CM | POA: Diagnosis present

## 2017-01-18 DIAGNOSIS — G309 Alzheimer's disease, unspecified: Secondary | ICD-10-CM | POA: Diagnosis present

## 2017-01-18 DIAGNOSIS — J189 Pneumonia, unspecified organism: Secondary | ICD-10-CM | POA: Diagnosis not present

## 2017-01-18 DIAGNOSIS — G92 Toxic encephalopathy: Secondary | ICD-10-CM | POA: Diagnosis present

## 2017-01-18 DIAGNOSIS — Z515 Encounter for palliative care: Secondary | ICD-10-CM | POA: Diagnosis not present

## 2017-01-18 DIAGNOSIS — L89620 Pressure ulcer of left heel, unstageable: Secondary | ICD-10-CM | POA: Diagnosis present

## 2017-01-18 DIAGNOSIS — R0600 Dyspnea, unspecified: Secondary | ICD-10-CM | POA: Diagnosis not present

## 2017-01-18 DIAGNOSIS — R059 Cough, unspecified: Secondary | ICD-10-CM

## 2017-01-18 DIAGNOSIS — Z66 Do not resuscitate: Secondary | ICD-10-CM | POA: Diagnosis present

## 2017-01-18 DIAGNOSIS — I10 Essential (primary) hypertension: Secondary | ICD-10-CM | POA: Diagnosis present

## 2017-01-18 DIAGNOSIS — R1310 Dysphagia, unspecified: Secondary | ICD-10-CM | POA: Diagnosis present

## 2017-01-18 DIAGNOSIS — R0602 Shortness of breath: Secondary | ICD-10-CM | POA: Diagnosis not present

## 2017-01-18 DIAGNOSIS — Z7982 Long term (current) use of aspirin: Secondary | ICD-10-CM | POA: Diagnosis not present

## 2017-01-18 LAB — CBC WITH DIFFERENTIAL/PLATELET
BASOS PCT: 1 %
Basophils Absolute: 0.1 10*3/uL (ref 0–0.1)
EOS ABS: 0.2 10*3/uL (ref 0–0.7)
Eosinophils Relative: 2 %
HCT: 32 % — ABNORMAL LOW (ref 35.0–47.0)
HEMOGLOBIN: 10.5 g/dL — AB (ref 12.0–16.0)
Lymphocytes Relative: 8 %
Lymphs Abs: 0.9 10*3/uL — ABNORMAL LOW (ref 1.0–3.6)
MCH: 31.5 pg (ref 26.0–34.0)
MCHC: 32.7 g/dL (ref 32.0–36.0)
MCV: 96.4 fL (ref 80.0–100.0)
Monocytes Absolute: 0.8 10*3/uL (ref 0.2–0.9)
Monocytes Relative: 7 %
NEUTROS PCT: 82 %
Neutro Abs: 9.2 10*3/uL — ABNORMAL HIGH (ref 1.4–6.5)
PLATELETS: 271 10*3/uL (ref 150–440)
RBC: 3.32 MIL/uL — AB (ref 3.80–5.20)
RDW: 14.9 % — ABNORMAL HIGH (ref 11.5–14.5)
WBC: 11.2 10*3/uL — ABNORMAL HIGH (ref 3.6–11.0)

## 2017-01-18 LAB — URINALYSIS, COMPLETE (UACMP) WITH MICROSCOPIC
BILIRUBIN URINE: NEGATIVE
Bacteria, UA: NONE SEEN
Glucose, UA: NEGATIVE mg/dL
Hgb urine dipstick: NEGATIVE
Ketones, ur: NEGATIVE mg/dL
Nitrite: NEGATIVE
PH: 7 (ref 5.0–8.0)
Protein, ur: 30 mg/dL — AB
SPECIFIC GRAVITY, URINE: 1.017 (ref 1.005–1.030)

## 2017-01-18 LAB — COMPREHENSIVE METABOLIC PANEL
ALBUMIN: 3 g/dL — AB (ref 3.5–5.0)
ALT: 17 U/L (ref 14–54)
AST: 33 U/L (ref 15–41)
Alkaline Phosphatase: 100 U/L (ref 38–126)
Anion gap: 9 (ref 5–15)
BUN: 27 mg/dL — AB (ref 6–20)
CHLORIDE: 108 mmol/L (ref 101–111)
CO2: 28 mmol/L (ref 22–32)
CREATININE: 0.95 mg/dL (ref 0.44–1.00)
Calcium: 8.3 mg/dL — ABNORMAL LOW (ref 8.9–10.3)
GFR calc Af Amer: 59 mL/min — ABNORMAL LOW (ref >60)
GFR, EST NON AFRICAN AMERICAN: 51 mL/min — AB (ref >60)
GLUCOSE: 122 mg/dL — AB (ref 65–99)
Potassium: 4.3 mmol/L (ref 3.5–5.1)
Sodium: 145 mmol/L (ref 135–145)
Total Bilirubin: 0.5 mg/dL (ref 0.3–1.2)
Total Protein: 6.8 g/dL (ref 6.5–8.1)

## 2017-01-18 LAB — INFLUENZA PANEL BY PCR (TYPE A & B)
INFLAPCR: NEGATIVE
INFLBPCR: NEGATIVE

## 2017-01-18 LAB — LACTIC ACID, PLASMA
LACTIC ACID, VENOUS: 1.1 mmol/L (ref 0.5–1.9)
Lactic Acid, Venous: 1.2 mmol/L (ref 0.5–1.9)

## 2017-01-18 LAB — MRSA PCR SCREENING: MRSA by PCR: NEGATIVE

## 2017-01-18 LAB — TROPONIN I: Troponin I: 0.04 ng/mL (ref <0.03)

## 2017-01-18 LAB — PROTIME-INR
INR: 1.9
PROTHROMBIN TIME: 21.6 s — AB (ref 11.4–15.2)

## 2017-01-18 LAB — BRAIN NATRIURETIC PEPTIDE: B NATRIURETIC PEPTIDE 5: 59 pg/mL (ref 0.0–100.0)

## 2017-01-18 MED ORDER — CRANBERRY 450 MG PO CAPS: 450.0000 mg | ORAL_CAPSULE | Freq: Every day | ORAL | Status: DC

## 2017-01-18 MED ORDER — MEMANTINE HCL 5 MG PO TABS
10.0000 mg | ORAL_TABLET | Freq: Two times a day (BID) | ORAL | Status: DC
  Administered 2017-01-18 – 2017-01-20 (×6): 10 mg via ORAL
  Filled 2017-01-18 (×7): qty 2

## 2017-01-18 MED ORDER — AZITHROMYCIN 500 MG IV SOLR
500.0000 mg | INTRAVENOUS | Status: DC
Start: 2017-01-19 — End: 2017-01-21
  Administered 2017-01-19 – 2017-01-20 (×2): 500 mg via INTRAVENOUS
  Filled 2017-01-18 (×3): qty 500

## 2017-01-18 MED ORDER — ESCITALOPRAM OXALATE 10 MG PO TABS
10.0000 mg | ORAL_TABLET | Freq: Every day | ORAL | Status: DC
  Administered 2017-01-18 – 2017-01-20 (×3): 10 mg via ORAL
  Filled 2017-01-18 (×4): qty 1

## 2017-01-18 MED ORDER — LOPERAMIDE HCL 2 MG PO CAPS
2.0000 mg | ORAL_CAPSULE | Freq: Four times a day (QID) | ORAL | Status: DC | PRN
Start: 2017-01-18 — End: 2017-01-21

## 2017-01-18 MED ORDER — ONDANSETRON 4 MG PO TBDP: 4.0000 mg | ORAL_TABLET | Freq: Three times a day (TID) | ORAL | Status: DC | PRN

## 2017-01-18 MED ORDER — AMLODIPINE BESYLATE 10 MG PO TABS
10.0000 mg | ORAL_TABLET | Freq: Every day | ORAL | Status: DC
  Administered 2017-01-18 – 2017-01-20 (×3): 10 mg via ORAL
  Filled 2017-01-18 (×3): qty 1

## 2017-01-18 MED ORDER — AZITHROMYCIN 500 MG IV SOLR
500.0000 mg | Freq: Once | INTRAVENOUS | Status: AC
  Administered 2017-01-18: 500 mg via INTRAVENOUS
  Filled 2017-01-18: qty 500

## 2017-01-18 MED ORDER — TRAZODONE HCL 50 MG PO TABS
25.0000 mg | ORAL_TABLET | ORAL | Status: DC | PRN
  Administered 2017-01-19: 25 mg via ORAL
  Filled 2017-01-18: qty 1

## 2017-01-18 MED ORDER — MIRTAZAPINE 15 MG PO TABS
7.5000 mg | ORAL_TABLET | Freq: Every day | ORAL | Status: DC
  Administered 2017-01-18 – 2017-01-20 (×3): 7.5 mg via ORAL
  Filled 2017-01-18 (×3): qty 1

## 2017-01-18 MED ORDER — DEXTROSE 5 % IV SOLN
1.0000 g | INTRAVENOUS | Status: DC
  Administered 2017-01-19 – 2017-01-20 (×2): 1 g via INTRAVENOUS
  Filled 2017-01-18 (×2): qty 10

## 2017-01-18 MED ORDER — SODIUM CHLORIDE 0.9 % IV SOLN
INTRAVENOUS | Status: DC
  Administered 2017-01-18: 14:00:00 via INTRAVENOUS

## 2017-01-18 MED ORDER — BACITRACIN ZINC 500 UNIT/GM EX OINT: TOPICAL_OINTMENT | Freq: Two times a day (BID) | CUTANEOUS | Status: DC

## 2017-01-18 MED ORDER — HYDROCHLOROTHIAZIDE 25 MG PO TABS
25.0000 mg | ORAL_TABLET | Freq: Every day | ORAL | Status: DC
  Administered 2017-01-18 – 2017-01-20 (×3): 25 mg via ORAL
  Filled 2017-01-18 (×3): qty 1

## 2017-01-18 MED ORDER — RISPERIDONE 0.25 MG PO TABS
0.2500 mg | ORAL_TABLET | Freq: Two times a day (BID) | ORAL | Status: DC
  Administered 2017-01-18 – 2017-01-20 (×6): 0.25 mg via ORAL
  Filled 2017-01-18 (×8): qty 1

## 2017-01-18 MED ORDER — ASPIRIN EC 81 MG PO TBEC
81.0000 mg | DELAYED_RELEASE_TABLET | Freq: Every day | ORAL | Status: DC
  Administered 2017-01-18 – 2017-01-20 (×3): 81 mg via ORAL
  Filled 2017-01-18 (×3): qty 1

## 2017-01-18 MED ORDER — ENOXAPARIN SODIUM 40 MG/0.4ML ~~LOC~~ SOLN
40.0000 mg | SUBCUTANEOUS | Status: DC
  Administered 2017-01-18 – 2017-01-20 (×3): 40 mg via SUBCUTANEOUS
  Filled 2017-01-18 (×3): qty 0.4

## 2017-01-18 MED ORDER — OCUVITE-LUTEIN PO TABS
1.0000 | ORAL_TABLET | Freq: Two times a day (BID) | ORAL | Status: DC
  Filled 2017-01-18: qty 1

## 2017-01-18 MED ORDER — ALBUTEROL SULFATE (2.5 MG/3ML) 0.083% IN NEBU: 2.5000 mg | INHALATION_SOLUTION | RESPIRATORY_TRACT | Status: DC | PRN

## 2017-01-18 MED ORDER — POTASSIUM CHLORIDE CRYS ER 20 MEQ PO TBCR
20.0000 meq | EXTENDED_RELEASE_TABLET | Freq: Every day | ORAL | Status: DC
  Administered 2017-01-18 – 2017-01-20 (×3): 20 meq via ORAL
  Filled 2017-01-18 (×3): qty 1

## 2017-01-18 MED ORDER — ACETAMINOPHEN 325 MG PO TABS
650.0000 mg | ORAL_TABLET | ORAL | Status: DC | PRN
  Administered 2017-01-19 – 2017-01-20 (×2): 650 mg via ORAL
  Filled 2017-01-18 (×2): qty 2

## 2017-01-18 MED ORDER — VITAMIN D3 25 MCG (1000 UNIT) PO TABS
1000.0000 [IU] | ORAL_TABLET | Freq: Every day | ORAL | Status: DC
  Administered 2017-01-18 – 2017-01-20 (×3): 1000 [IU] via ORAL
  Filled 2017-01-18 (×7): qty 1

## 2017-01-18 MED ORDER — ONDANSETRON HCL 4 MG PO TABS: 4.0000 mg | ORAL_TABLET | Freq: Three times a day (TID) | ORAL | Status: DC | PRN

## 2017-01-18 MED ORDER — OCUVITE-LUTEIN PO CAPS
1.0000 | ORAL_CAPSULE | Freq: Every day | ORAL | Status: DC
  Administered 2017-01-19 – 2017-01-20 (×2): 1 via ORAL
  Filled 2017-01-18 (×4): qty 1

## 2017-01-18 MED ORDER — DONEPEZIL HCL 5 MG PO TABS
5.0000 mg | ORAL_TABLET | Freq: Every day | ORAL | Status: DC
  Administered 2017-01-18 – 2017-01-20 (×3): 5 mg via ORAL
  Filled 2017-01-18 (×4): qty 1

## 2017-01-18 MED ORDER — CEFTRIAXONE SODIUM IN DEXTROSE 20 MG/ML IV SOLN
1.0000 g | Freq: Once | INTRAVENOUS | Status: AC
  Administered 2017-01-18: 1 g via INTRAVENOUS
  Filled 2017-01-18: qty 50

## 2017-01-18 MED ORDER — METHIMAZOLE 10 MG PO TABS
10.0000 mg | ORAL_TABLET | Freq: Every day | ORAL | Status: DC
  Administered 2017-01-18 – 2017-01-20 (×3): 10 mg via ORAL
  Filled 2017-01-18 (×4): qty 1

## 2017-01-18 MED ORDER — POLYETHYLENE GLYCOL 3350 17 G PO PACK: 17.0000 g | PACK | Freq: Every day | ORAL | Status: DC | PRN

## 2017-01-18 NOTE — H&P (Signed)
[facility] NAME: Sarah [name]    MR#:  [id number]  DATE OF BIRTH:  08/24/1924  DATE OF ADMISSION:  01/18/2017  PRIMARY CARE PHYSICIAN: Raelyn Number, MD   REQUESTING/REFERRING PHYSICIAN: McShane  CHIEF COMPLAINT:  Shortness of breath  HISTORY OF PRESENT ILLNESS:  Sarah [name]  is a 84 y.o. female with a known history of chronic Alzheimer's dementia, hypertension, living in a facility is brought into the ED for acute hypoxia. Patient's pulse ox was at 80% on room air. Chest x-ray did not reveal any infiltrate but has revealed right upper lobe mass. Patient's son who is the healthcare power of attorney  is aware of the mass and doesn't want to pursue any other investigations as patient is 83 year old patient is a poor historian PAST MEDICAL HISTORY:   Past Medical History:  Diagnosis Date  . Dementia   . Fall   . Hyperlipidemia   . Hypertension   . Macular degeneration   . Osteoporosis     PAST SURGICAL HISTOIRY:  No past surgical history on file.  SOCIAL HISTORY:   Social History   Tobacco Use  . Smoking status: Former Research scientist (life sciences)  . Smokeless tobacco: Never Used  Substance Use Topics  . Alcohol use: No    FAMILY HISTORY:   Family History  Problem Relation Age of Onset  . CVA Mother   . Dementia Father     DRUG ALLERGIES:  No Known Allergies  REVIEW OF SYSTEMS:  Review of system unobtainable as the patient is chronically demented   MEDICATIONS AT HOME:   Prior to Admission medications   Medication Sig Start Date End Date Taking? Authorizing Provider  acetaminophen (TYLENOL) 500 MG tablet Take 500 mg by mouth 2 (two) times daily.   Yes [provider]  amLODipine (NORVASC) 10 MG tablet Take 10 mg by mouth daily.    Yes [provider]  aspirin EC 81 MG EC tablet Take 1 tablet (81 mg total) by mouth daily. 07/05/16  Yes Mody, Ulice Bold, MD  Cholecalciferol (VITAMIN D-1000 MAX ST) 1000  units tablet Take 1 tablet by mouth daily.   Yes [provider]  Cranberry 450 MG CAPS Take 450 mg by mouth daily.    Yes [provider]  donepezil (ARICEPT) 5 MG tablet Take 5 mg by mouth at bedtime.   Yes [provider]  escitalopram (LEXAPRO) 10 MG tablet Take 10 mg by mouth at bedtime.   Yes [provider]  hydrochlorothiazide (HYDRODIURIL) 25 MG tablet Take 25 mg by mouth daily.   Yes [provider]  loperamide (IMODIUM) 2 MG capsule Take 2 mg by mouth every 6 (six) hours as needed for diarrhea or loose stools.   Yes [provider]  memantine (NAMENDA) 10 MG tablet Take 10 mg by mouth 2 (two) times daily.  08/06/16  Yes [provider]  methimazole (TAPAZOLE) 10 MG tablet Take 10 mg by mouth daily.   Yes [provider]  mirtazapine (REMERON) 15 MG tablet Take 7.5 mg by mouth at bedtime. 08/13/16  Yes [provider]  Multiple Vitamins-Minerals (PRESERVISION AREDS) TABS Take 1 capsule by mouth 2 (two) times daily.   Yes [provider]  potassium chloride SA (K-DUR,KLOR-CON) 20 MEQ tablet Take 20 mEq by mouth daily.    Yes [provider]  risperiDONE (RISPERDAL) 0.25 MG tablet Take 0.25 mg by mouth 2 (two) times daily.   Yes  [provider]  traZODone (DESYREL) 50 MG tablet Take 12.5 mg by mouth every 4 (four) hours as needed for sleep.   Yes [provider]  acetaminophen (TYLENOL) 325 MG tablet Take 650 mg by mouth every 4 (four) hours as needed for mild pain, fever or headache.     [provider]  bacitracin ointment Apply to affected area daily Patient not taking: Reported on 10/29/2016 09/14/16 09/14/17  Loney Hering, MD  ondansetron (ZOFRAN ODT) 4 MG disintegrating tablet Take 1 tablet (4 mg total) by mouth every 8 (eight) hours as needed for nausea or vomiting. Patient not taking: Reported on 01/18/2017 06/30/16   Rudene Re, MD  ondansetron  [facility]) 4 MG tablet Take 1 tablet (4 mg total) by mouth every 8 (eight) hours as needed for nausea or vomiting. 10/29/16   Alfred Levins, [place], MD      VITAL SIGNS:  Blood pressure (!) 147/48, pulse 66, temperature 99.1 F (37.3 C), temperature source Oral, resp. rate 19, weight 60 kg (132 lb 4.4 oz), SpO2 93 %.  PHYSICAL EXAMINATION:  GENERAL:  83 y.o.-year-old patient lying in the bed with no acute distress.  EYES: Pupils equal, round, reactive to light and accommodation. No scleral icterus. Extraocular muscles intact.  HEENT: Head atraumatic, normocephalic. Oropharynx and nasopharynx clear.  NECK:  Supple, no jugular venous distention. No thyroid enlargement, no tenderness.  LUNGS: Mod coarse breath sounds bilaterally, no wheezing, rales,rhonchi , has rt sided crepitation. No use of accessory muscles of respiration.  CARDIOVASCULAR: S1, S2 normal. No murmurs, rubs, or gallops.  ABDOMEN: Soft, nontender, nondistended. Bowel sounds present. No organomegaly or mass.  EXTREMITIES: No pedal edema, cyanosis, or clubbing.  NEUROLOGIC: Patient is awake and alert and disoriented . Gait not checked.  PSYCHIATRIC: The patient is alert and chronically demented  SKIN: No obvious rash, lesion, or ulcer.   LABORATORY PANEL:   CBC Recent Labs  Lab 01/18/17 0704  WBC 11.2*  HGB 10.5*  HCT 32.0*  PLT 271   ------------------------------------------------------------------------------------------------------------------  Chemistries  No results for input(s): NA, K, CL, CO2, GLUCOSE, BUN, CREATININE, CALCIUM, MG, AST, ALT, ALKPHOS, BILITOT in the last 168 hours.  Invalid input(s): GFRCGP ------------------------------------------------------------------------------------------------------------------  Cardiac Enzymes Recent Labs  Lab 01/18/17 0800  TROPONINI 0.04*    ------------------------------------------------------------------------------------------------------------------  RADIOLOGY:  Dg Chest 1 View  Result Date: 01/18/2017 CLINICAL DATA:  Shortness of Breath EXAM: CHEST 1 VIEW COMPARISON:  06/30/2016 FINDINGS: Previously seen ground-glass masslike opacity in the right upper lobe on CT not as well visualized by plain film. No visible confluent airspace opacities or effusions. Heart is normal size. IMPRESSION: No acute cardiopulmonary disease. Previously seen ground-glass masslike density in the right upper lobe not as well visualized. Electronically Signed   By: Rolm Baptise M.D.   On: 01/18/2017 07:27    EKG:   Orders placed or performed during the hospital encounter of 10/29/16  . EKG 12-Lead  . EKG 12-Lead  . EKG 12-Lead  . EKG 12-Lead  . EKG    IMPRESSION AND PLAN:   Sarah [name]  is a 83 y.o. female with a known history of chronic Alzheimer's dementia, hypertension, living in a facility is brought into the ED for acute hypoxia. Patient's pulse ox was at 80% on room air. Chest x-ray did not reveal any infiltrate but has revealed right upper lobe mass.  #Acute hypoxic respiratory failure 2/2 acute bronchitis and ongoing pneumonia with underlying right upper lobe mass Admit patient to MedSurg unit IV  Rocephin and azithromycin Oxygen via nasal cannula Sputum culture and sensitivity Neb treatments  #Essential hypertension continue home medication hydrochlorothiazide and titrate as needed   #Chronic Alzheimer's dementia Continue home medication Aricept and Namenda  #Right upper lobe mass-likely cancer Patient's son is aware of that, doesn't want to pursue further diagnostic studies or investigations   DVT prophylaxis with Lovenox subcutaneous   All the records are reviewed and case discussed with ED provider. Management plans discussed with the patient, family and they are in agreement.  CODE STATUS: DO NOT RESUSCITATE, son  is the healthcare power of attorney  TOTAL TIME TAKING CARE OF THIS PATIENT: 43 minutes.   Note: This dictation was prepared with Dragon dictation along with smaller phrase technology. Any transcriptional errors that result from this process are unintentional.  Nicholes Mango M.D on 01/18/2017 at 10:52 AM  Between 7am to 6pm - Pager - [phone]  After 6pm go to [email or web address] - password [email or web address] [facility]  Office  [phone]  CC: Primary care physician; Raelyn Number, MD

## 2017-01-18 NOTE — Progress Notes (Signed)
PHARMACIST - PHYSICIAN ORDER COMMUNICATION  CONCERNING: P&T Medication Policy on Herbal Medications  DESCRIPTION:  This patient's order for:  cranberry  has been noted.  This product(s) is classified as an "herbal" or natural product. Due to a lack of definitive safety studies or FDA approval, nonstandard manufacturing practices, plus the potential risk of unknown drug-drug interactions while on inpatient medications, the Pharmacy and Therapeutics Committee does not permit the use of "herbal" or natural products of this type within [facility].   ACTION TAKEN: The pharmacy department is unable to verify this order at this time and your patient has been informed of this safety policy. Please reevaluate patient's clinical condition at discharge and address if the herbal or natural product(s) should be resumed at that time.

## 2017-01-18 NOTE — ED Provider Notes (Signed)
[facility] Emergency Department Provider Note  ____________________________________________   I have reviewed the triage vital signs and the nursing notes.   HISTORY  Chief Complaint Shortness of Breath    HPI ELDENE PLOCHER is a 83 y.o. female history of dementia, hypertension, CVA, at her total baseline per EMS presents with shortness of breath History is very limited Level 5 chart caveat; no further history available due to patient status. According to EMS patient was orthopneic and required oxygen which is unusual for her.  Patient is DNR  Past Medical History:  Diagnosis Date  . Dementia   . Fall   . Hyperlipidemia   . Hypertension   . Macular degeneration   . Osteoporosis     Patient Active Problem List   Diagnosis Date Noted  . Mass of upper lobe of right lung 07/11/2016  . CVA (cerebral vascular accident) ([facility]) 07/02/2016    No past surgical history on file.  Prior to Admission medications   Medication Sig Start Date End Date Taking? Authorizing Provider  acetaminophen (TYLENOL) 325 MG tablet Take 650 mg by mouth every 4 (four) hours as needed for mild pain, fever or headache.     [provider]  acetaminophen (TYLENOL) 500 MG tablet Take 500 mg by mouth 2 (two) times daily.    [provider]  amLODipine (NORVASC) 10 MG tablet Take 10 mg by mouth daily.     [provider]  aspirin EC 81 MG EC tablet Take 1 tablet (81 mg total) by mouth daily. 07/05/16   Bettey Costa, MD  bacitracin ointment Apply to affected area daily Patient not taking: Reported on 10/29/2016 09/14/16 09/14/17  Loney Hering, MD  Cholecalciferol (VITAMIN D-1000 MAX ST) 1000 units tablet Take 1 tablet by mouth daily.    [provider]  Cranberry 450 MG CAPS Take 450 mg by mouth daily.     [provider]  donepezil (ARICEPT) 5 MG tablet Take 5 mg by mouth at bedtime.    [provider]  escitalopram (LEXAPRO) 10  MG tablet Take 10 mg by mouth at bedtime.    [provider]  hydrochlorothiazide (HYDRODIURIL) 25 MG tablet Take 25 mg by mouth daily.    [provider]  loperamide (IMODIUM) 2 MG capsule Take 2 mg by mouth every 6 (six) hours as needed for diarrhea or loose stools.    [provider]  Melatonin 5 MG TABS Take 1 tablet by mouth at bedtime.    [provider]  memantine (NAMENDA) 10 MG tablet Take 10 mg by mouth 2 (two) times daily.  08/06/16   [provider]  methimazole (TAPAZOLE) 10 MG tablet Take 10 mg by mouth daily.    [provider]  mirtazapine (REMERON) 15 MG tablet Take 7.5 mg by mouth at bedtime. 08/13/16   [provider]  Multiple Vitamins-Minerals (PRESERVISION AREDS) TABS Take 1 capsule by mouth 2 (two) times daily.    [provider]  ondansetron (ZOFRAN ODT) 4 MG disintegrating tablet Take 1 tablet (4 mg total) by mouth every 8 (eight) hours as needed for nausea or vomiting. Patient taking differently: Take 4 mg by mouth every 6 (six) hours as needed for nausea or vomiting.  06/30/16   Alfred Levins, [place], MD  ondansetron (ZOFRAN) 4 MG tablet Take 1 tablet (4 mg total) by mouth every 8 (eight) hours as needed for nausea or vomiting. 10/29/16   Rudene Re, MD  potassium chloride  SA (K-DUR,KLOR-CON) 20 MEQ tablet Take 20 mEq by mouth daily.     [provider]  risperiDONE (RISPERDAL) 0.25 MG tablet Take 0.25 mg by mouth 2 (two) times daily.    [provider]    Allergies Patient has no known allergies.  Family History  Problem Relation Age of Onset  . CVA Mother   . Dementia Father     Social History Social History   Tobacco Use  . Smoking status: Former Research scientist (life sciences)  . Smokeless tobacco: Never Used  Substance Use Topics  . Alcohol use: No  . Drug use: No    Review of Systems Level 5 chart caveat; no further history available due to patient  status.   ____________________________________________   PHYSICAL EXAM:  VITAL SIGNS: ED Triage Vitals  Enc Vitals Group     BP 01/18/17 0702 (!) 129/56     Pulse Rate 01/18/17 0702 84     Resp 01/18/17 0702 (!) 22     Temp 01/18/17 0702 99.1 F (37.3 C)     Temp Source 01/18/17 0702 Oral     SpO2 01/18/17 0702 96 %     Weight 01/18/17 0703 132 lb 4.4 oz (60 kg)     Height --      Head Circumference --      Peak Flow --      Pain Score --      Pain Loc --      Pain Edu? --      Excl. in [place]? --     Constitutional: Alert and oriented to name only Eyes: Conjunctivae are normal Head: Atraumatic HEENT: No congestion/rhinnorhea. Mucous membranes are moist.  Oropharynx non-erythematous Neck:   Nontender with no meningismus, no masses, no stridor Cardiovascular: Normal rate, regular rhythm. Grossly normal heart sounds.  Good peripheral circulation. Respiratory: Poor respiratory effort.  No retractions.  Diminished in  bases with occasional rales, no acute respiratory distress Abdominal: Soft and nontender. No distention. No guarding no rebound Back:  There is no focal tenderness or step off.  there is no midline tenderness there are no lesions noted. there is no CVA tenderness Musculoskeletal: Large scar on the left leg and some edema on the left versus the right Neurologic:  Normal speech and language. No gross focal neurologic deficits are appreciated.  Skin:  Skin is warm, dry and intact. No rash noted.   ____________________________________________   LABS (all labs ordered are listed, but only abnormal results are displayed)  Labs Reviewed  CULTURE, BLOOD (ROUTINE X 2)  CULTURE, BLOOD (ROUTINE X 2)  COMPREHENSIVE METABOLIC PANEL  LACTIC ACID, PLASMA  LACTIC ACID, PLASMA  CBC WITH DIFFERENTIAL/PLATELET  PROTIME-INR  URINALYSIS, COMPLETE (UACMP) WITH MICROSCOPIC  TROPONIN I  BRAIN NATRIURETIC PEPTIDE    Pertinent labs  results that were available during my care  of the patient were reviewed by me and considered in my medical decision making (see chart for details). ____________________________________________  EKG  I personally interpreted any EKGs ordered by me or triage Normal sinus rhythm rate 70 bpm no acute ST elevation or depression normal axis unremarkable EKG ____________________________________________  RADIOLOGY  Pertinent labs & imaging results that were available during my care of the patient were reviewed by me and considered in my medical decision making (see chart for details). If possible, patient and/or family made aware of any abnormal findings.  Dg Chest 1 View  Result Date: 01/18/2017 CLINICAL DATA:  Shortness of Breath EXAM: CHEST 1 VIEW COMPARISON:  06/30/2016 FINDINGS: Previously seen ground-glass masslike opacity in the right upper lobe on CT not as well visualized by plain film. No visible confluent airspace opacities or effusions. Heart is normal size. IMPRESSION: No acute cardiopulmonary disease. Previously seen ground-glass masslike density in the right upper lobe not as well visualized. Electronically Signed   By: Rolm Baptise M.D.   On: 01/18/2017 07:27   ____________________________________________    PROCEDURES  Procedure(s) performed: None  Procedures  Critical Care performed: None  ____________________________________________   INITIAL IMPRESSION / ASSESSMENT AND PLAN / ED COURSE  Pertinent labs & imaging results that were available during my care of the patient were reviewed by me and considered in my medical decision making (see chart for details).  Patient here with shortness of breath and suggested orthopnea from EMS.  Stable at this time on 2 L.  We will evaluate her for CHF COPD pneumonia etc. and reassess.    ____________________________________________   FINAL CLINICAL IMPRESSION(S) / ED DIAGNOSES  Final diagnoses:  None      This chart was dictated using voice recognition software.   Despite best efforts to proofread,  errors can occur which can change meaning.      Schuyler Amor, MD 01/18/17 [phone]

## 2017-01-18 NOTE — ED Notes (Signed)
Report to ashley, rn

## 2017-01-18 NOTE — ED Triage Notes (Signed)
Pt from [facility] with shob. Per ems pt with chf history and was lying flat on their arrival. Ems states pt with ra pox of 88% and placed pt on 2lpm [place] o2. Pt denies pain. Ems states pt recently diagnosed with uri and started on bactrim.

## 2017-01-18 NOTE — Progress Notes (Signed)
lovenox dose changed from 30mg  daily to 40mg  daily due to crcl >39ml/min and body wight >45kg  Zaylen Susman D Nikola Marone, Pharm.D, BCPS Clinical Pharmacist

## 2017-01-18 NOTE — Clinical Social Work Note (Signed)
Clinical Social Work Assessment  Patient Details  Name: Sarah [name] MRN: [id number] Date of Birth: [birth year]  Date of referral:  01/18/17               Reason for consult:  Other (Comment Required)(From [facility])                Permission sought to share information with:    Permission granted to share information::  Yes, Verbal Permission Granted  Name::     Sarah [name] [facility] [phone]  Agency::  [facility]  Relationship::     Contact Information:     Housing/Transportation Living arrangements for the past 2 months:  [place] of Information:  [place], Adult Children Patient Interpreter Needed:  None Criminal Activity/Legal Involvement Pertinent to Current Situation/Hospitalization:  No - Comment as needed Significant Relationships:  Adult Children Lives with:  Facility Resident Do you feel safe going back to the place where you live?  Yes Need for family participation in patient care:  Yes (Comment)  Care giving concerns: Son reports his mom might be declining and he wants patient to return to [place].   Social Worker assessment / plan: LCSW introduced myself to patient who was non responsive- Unable to complete assessment as asleep and not responding to external stimuli. Called Patients HCPOA son Sarah [name] and collected data to complete assessment. Patient is widowed and has only son who is HCPOA. She has dementia and alzheimer's and uses a wheel chair, recently she aspirates on food. She has resided at [facility] assisted Living for 2 years and this is where son wants her returned. He is aware she has a mass on her lung, no treatment as well. Patient needs full assistance with ADLs, she is blind and has macular degeneration and for the most part non verbal. Son reports he runs the farm and doesn't get a chance to visit as much as he like to but does try.   Employment status:  Retired Forensic scientist:  Information systems manager,  Other (Comment Required)([employer]) PT Recommendations:    Information / Referral to community resources:  [place]  Patient/Family's Response to care: She is to return to [facility]  Patient/Family's Understanding of and Emotional Response to Diagnosis, Current Treatment, and Prognosis:  Unable to assess, family has good understanding  Emotional Assessment Appearance:  Appears stated age Attitude/Demeanor/Rapport:  Unable to Assess Affect (typically observed):  Unable to Assess Orientation:  Fluctuating Orientation (Suspected and/or reported Sundowners) Alcohol / Substance use:  Not Applicable Psych involvement (Current and /or in the community):  No (Comment)  Discharge Needs  Concerns to be addressed:  No discharge needs identified Readmission within the last 30 days:  Yes Current discharge risk:  None Barriers to Discharge:  No Barriers Identified   Sarah Reamer, LCSW 01/18/2017, 4:29 PM

## 2017-01-19 DIAGNOSIS — L8990 Pressure ulcer of unspecified site, unspecified stage: Secondary | ICD-10-CM

## 2017-01-19 LAB — BLOOD CULTURE ID PANEL (REFLEXED)
Acinetobacter baumannii: NOT DETECTED
CANDIDA ALBICANS: NOT DETECTED
CANDIDA GLABRATA: NOT DETECTED
CANDIDA KRUSEI: NOT DETECTED
Candida parapsilosis: NOT DETECTED
Candida tropicalis: NOT DETECTED
ENTEROBACTER CLOACAE COMPLEX: NOT DETECTED
ENTEROBACTERIACEAE SPECIES: NOT DETECTED
ENTEROCOCCUS SPECIES: NOT DETECTED
ESCHERICHIA COLI: NOT DETECTED
Haemophilus influenzae: NOT DETECTED
Klebsiella oxytoca: NOT DETECTED
Klebsiella pneumoniae: NOT DETECTED
LISTERIA MONOCYTOGENES: NOT DETECTED
Methicillin resistance: DETECTED — AB
NEISSERIA MENINGITIDIS: NOT DETECTED
PSEUDOMONAS AERUGINOSA: NOT DETECTED
Proteus species: NOT DETECTED
STAPHYLOCOCCUS SPECIES: DETECTED — AB
STREPTOCOCCUS AGALACTIAE: NOT DETECTED
STREPTOCOCCUS PNEUMONIAE: NOT DETECTED
STREPTOCOCCUS PYOGENES: NOT DETECTED
STREPTOCOCCUS SPECIES: NOT DETECTED
Serratia marcescens: NOT DETECTED
Staphylococcus aureus (BCID): NOT DETECTED

## 2017-01-19 LAB — COMPREHENSIVE METABOLIC PANEL
ALBUMIN: 2.8 g/dL — AB (ref 3.5–5.0)
ALK PHOS: 92 U/L (ref 38–126)
ALT: 15 U/L (ref 14–54)
ANION GAP: 7 (ref 5–15)
AST: 24 U/L (ref 15–41)
BUN: 20 mg/dL (ref 6–20)
CALCIUM: 8.4 mg/dL — AB (ref 8.9–10.3)
CO2: 27 mmol/L (ref 22–32)
CREATININE: 0.82 mg/dL (ref 0.44–1.00)
Chloride: 110 mmol/L (ref 101–111)
GFR calc Af Amer: >60 mL/min (ref >60)
GFR calc non Af Amer: >60 mL/min (ref >60)
GLUCOSE: 108 mg/dL — AB (ref 65–99)
Potassium: 3.9 mmol/L (ref 3.5–5.1)
SODIUM: 144 mmol/L (ref 135–145)
Total Bilirubin: 0.3 mg/dL (ref 0.3–1.2)
Total Protein: 6.4 g/dL — ABNORMAL LOW (ref 6.5–8.1)

## 2017-01-19 LAB — CBC
HCT: 30.4 % — ABNORMAL LOW (ref 35.0–47.0)
HEMOGLOBIN: 10 g/dL — AB (ref 12.0–16.0)
MCH: 31.6 pg (ref 26.0–34.0)
MCHC: 33 g/dL (ref 32.0–36.0)
MCV: 95.9 fL (ref 80.0–100.0)
Platelets: 237 10*3/uL (ref 150–440)
RBC: 3.17 MIL/uL — AB (ref 3.80–5.20)
RDW: 14.4 % (ref 11.5–14.5)
WBC: 9.7 10*3/uL (ref 3.6–11.0)

## 2017-01-19 MED ORDER — VANCOMYCIN HCL IN DEXTROSE 750-5 MG/150ML-% IV SOLN
750.0000 mg | INTRAVENOUS | Status: DC
  Filled 2017-01-19: qty 150

## 2017-01-19 MED ORDER — VANCOMYCIN HCL IN DEXTROSE 750-5 MG/150ML-% IV SOLN
750.0000 mg | Freq: Once | INTRAVENOUS | Status: DC
  Administered 2017-01-19: 07:00:00 750 mg via INTRAVENOUS
  Filled 2017-01-19: qty 150

## 2017-01-19 MED ORDER — ORAL CARE MOUTH RINSE
15.0000 mL | Freq: Two times a day (BID) | OROMUCOSAL | Status: DC
  Administered 2017-01-20 (×2): 15 mL via OROMUCOSAL

## 2017-01-19 NOTE — Evaluation (Addendum)
Clinical/Bedside Swallow Evaluation Patient Details  Name: Sarah [name] MRN: [id number] Date of Birth: [birth year]  Today's Date: 01/19/2017 Time: SLP Start Time (ACUTE ONLY): 1045 SLP Stop Time (ACUTE ONLY): 1145 SLP Time Calculation (min) (ACUTE ONLY): 60 min  Past Medical History:  Past Medical History:  Diagnosis Date  . Dementia   . Fall   . Hyperlipidemia   . Hypertension   . Macular degeneration   . Osteoporosis    Past Surgical History: History reviewed. No pertinent surgical history. HPI:  Pt is a 83 y.o. female with a known history of chronic Alzheimer's dementia, hypertension, living in a facility is brought into the ED for acute hypoxia. Patient's pulse ox was at 80% on room air. Chest x-ray did not reveal any infiltrate but has revealed right upper lobe mass. Patient's son who is the healthcare power of attorney  is aware of the mass and doesn't want to pursue any other investigations as patient is 23 ys/o.     Assessment / Plan / Recommendation Clinical Impression  Pt appears to present w/ min pharyngeal phase dysphagia, moreso when drinking thin liquids Via Straw, and presents w/ min increased risk for aspiration when not following strict aspiration precautions. Pt consumed trials of ice chips, thin liquids, purees w/ broken down crushed foods. Pt exhibited min slower oral phase to fully masticate increased textures; adequate oral clearing given time. Pt consumed thin liquids w/ audible swallows, min discoordination but overall w/ no overt coughing or throat clearing and no decline in respiratory status during/post trials. Pt required moderate+ assistance w/ po trials; concerned re: declined cognitive status and engagement during po intake at times. Verbal cues required to direct to task intermittently. Due to pt's current presentation, recommend a modified diet for conservation of energy; strict aspiration precautions w/ all po's. ST services will f/u w/ toleration of diet,  trials to upgrade if appropriate. NSG updated.  SLP Visit Diagnosis: Dysphagia, oropharyngeal phase (R13.12)    Aspiration Risk  Mild aspiration risk(moreso w/ thin liquids)    Diet Recommendation  Dysphagia level 2(minced foods, moistened) w/ Thin liquids - NO STRAWS.  Aspiration precautions and monitoring during meals; reduce distractions at meals, talking. Support at meals.   Medication Administration: Whole meds with puree(Crushed in puree as needed/able to)    Other  Recommendations Recommended Consults: (Dietician f/u) Oral Care Recommendations: Oral care BID;Staff/trained caregiver to provide oral care;Patient independent with oral care   Follow up Recommendations None      Frequency and Duration min 3x week  2 weeks       Prognosis Prognosis for Safe Diet Advancement: Fair Barriers to Reach Goals: Cognitive deficits;Severity of deficits      Swallow Study   General Date of Onset: 01/18/17 HPI: Pt is a 83 y.o. female with a known history of chronic Alzheimer's dementia, hypertension, living in a facility is brought into the ED for acute hypoxia. Patient's pulse ox was at 80% on room air. Chest x-ray did not reveal any infiltrate but has revealed right upper lobe mass. Patient's son who is the healthcare power of attorney  is aware of the mass and doesn't want to pursue any other investigations as patient is 36 ys/o.   Type of Study: Bedside Swallow Evaluation Previous Swallow Assessment: none indicated Diet Prior to this Study: (soft diet(?)) Temperature Spikes Noted: No(WBC declining since admission to 9.7) Respiratory Status: Nasal cannula(3-4 liters) History of Recent Intubation: No Behavior/Cognition: Alert;Cooperative;Pleasant mood;Confused;Distractible;Requires cueing Oral Cavity Assessment: Dry Oral  Care Completed by SLP: Recent completion by staff Oral Cavity - Dentition: Adequate natural dentition Vision: Functional for self-feeding(appeared) Self-Feeding  Abilities: Able to feed self;Needs assist;Needs set up;Total assist Patient Positioning: Upright in bed Baseline Vocal Quality: Low vocal intensity(mumbled speech intermittently) Volitional Cough: Cognitively unable to elicit Volitional Swallow: Unable to elicit    Oral/Motor/Sensory Function Overall Oral Motor/Sensory Function: Within functional limits(and w/ bolus management)   Ice Chips Ice chips: Within functional limits Presentation: Spoon(fed; 5 trials ) Other Comments: enjoyed masticating the ice chips!   Thin Liquid Thin Liquid: Impaired Presentation: Cup;Self Fed;Straw(2-3 trials via straw; 6 trials via Cup) Oral Phase Impairments: (none) Oral Phase Functional Implications: (none) Pharyngeal  Phase Impairments: Cough - Immediate;Cough - Delayed(when drinking VIA STRAW only; appropriate via Cup)    Nectar Thick Nectar Thick Liquid: Not tested   Honey Thick Honey Thick Liquid: Not tested   Puree Puree: Within functional limits Presentation: Spoon(fed; 8 trials)   Solid   GO   Solid: Impaired(mastication of increased texture; attention to task/effort) Presentation: Spoon(fed; 4 trials) Oral Phase Impairments: Impaired mastication Oral Phase Functional Implications: Impaired mastication(increased time) Pharyngeal Phase Impairments: (none)    Functional Assessment Tool Used: clinical judgement Functional Limitations: Swallowing Swallow Current Status (O1157): At least 20 percent but less than 40 percent impaired, limited or restricted Swallow Goal Status [phone]): At least 20 percent but less than 40 percent impaired, limited or restricted Swallow Discharge Status [phone]): At least 1 percent but less than 20 percent impaired, limited or restricted     Orinda Kenner, MS, CCC-SLP Yohanna Tow 01/19/2017,4:00 PM

## 2017-01-19 NOTE — Progress Notes (Signed)
Pharmacy Antibiotic Note  Sarah [name] is a 83 y.o. female admitted on 01/18/2017 with BCID (+) for Staph spp mec A(+).  Pharmacy has been consulted for vancomycin dosing.  Plan: DW 58kg  Vd 41L kei 0.034 hr-1  T1/2 20 hours Vancomycin 750 mg q 24 hours ordered with stacked dosing. Level before 5th dose. Goal trough 15-20.  Height: 5\' 2"  (157.5 cm) Weight: 127 lb 3.2 oz (57.7 kg) IBW/kg (Calculated) : 50.1  Temp (24hrs), Avg:98.7 F (37.1 C), Min:98 F (36.7 C), Max:99.1 F (37.3 C)  Recent Labs  Lab 01/18/17 0704 01/18/17 1105 01/19/17 0423  WBC 11.2*  --  9.7  CREATININE  --  0.95 0.82  LATICACIDVEN 1.1 1.2  --     Estimated Creatinine Clearance: 36.1 mL/min (by C-G formula based on SCr of 0.82 mg/dL).    No Known Allergies  Antimicrobials this admission: Ceftriaxone, azithromycin, vancomycin 12/3   >>    >>   Dose adjustments this admission:   Microbiology results: 12/2 BCx: pending 12/2 UCx: pending  12/3 BCID GPC x2 anaerobic. Staph spp. mec A(+) 12/2 MRSA PCR: (-)  Thank you for allowing pharmacy to be a part of this patient's care.  Burlon Centrella S 01/19/2017 5:29 AM

## 2017-01-19 NOTE — Progress Notes (Signed)
[facility] NAME: Sarah [name]    MR#:  [id number]  DATE OF BIRTH:  [birth year]  SUBJECTIVE:  CHIEF COMPLAINT:   Chief Complaint  Patient presents with  . Shortness of Breath   Patient has advanced dementia.  Poor historian.  On 4 L oxygen.  Afebrile today.  REVIEW OF SYSTEMS:    Review of Systems  Unable to perform ROS: Dementia   DRUG ALLERGIES:  No Known Allergies  VITALS:  Blood pressure (!) 126/46, pulse 66, temperature 98.3 F (36.8 C), resp. rate 18, height 5\' 2"  (1.575 m), weight 57.7 kg (127 lb 3.2 oz), SpO2 99 %.  PHYSICAL EXAMINATION:   Physical Exam  GENERAL:  83 y.o.-year-old patient lying in the bed with no acute distress.  EYES: Pupils equal, round, reactive to light and accommodation. No scleral icterus. Extraocular muscles intact.  HEENT: Head atraumatic, normocephalic. Oropharynx and nasopharynx clear.  NECK:  Supple, no jugular venous distention. No thyroid enlargement, no tenderness.  LUNGS: Normal breath sounds bilaterally, no wheezing, rales, rhonchi. No use of accessory muscles of respiration.  CARDIOVASCULAR: S1, S2 normal. No murmurs, rubs, or gallops.  ABDOMEN: Soft, nontender, nondistended. Bowel sounds present. No organomegaly or mass.  EXTREMITIES: No cyanosis, clubbing or edema b/l.    NEUROLOGIC: Cranial nerves II through XII are intact. No focal Motor or sensory deficits b/l.   PSYCHIATRIC: The patient is alert and awake. Pleasantly confused SKIN: No obvious rash, lesion, or ulcer.   LABORATORY PANEL:   CBC Recent Labs  Lab 01/19/17 0423  WBC 9.7  HGB 10.0*  HCT 30.4*  PLT 237   ------------------------------------------------------------------------------------------------------------------ Chemistries  Recent Labs  Lab 01/19/17 0423  NA 144  K 3.9  CL 110  CO2 27  GLUCOSE 108*  BUN 20  CREATININE 0.82  CALCIUM 8.4*  AST 24  ALT 15  ALKPHOS 92  BILITOT 0.3    ------------------------------------------------------------------------------------------------------------------  Cardiac Enzymes Recent Labs  Lab 01/18/17 0800  TROPONINI 0.04*   ------------------------------------------------------------------------------------------------------------------  RADIOLOGY:  Dg Chest 1 View  Result Date: 01/18/2017 CLINICAL DATA:  Shortness of Breath EXAM: CHEST 1 VIEW COMPARISON:  06/30/2016 FINDINGS: Previously seen ground-glass masslike opacity in the right upper lobe on CT not as well visualized by plain film. No visible confluent airspace opacities or effusions. Heart is normal size. IMPRESSION: No acute cardiopulmonary disease. Previously seen ground-glass masslike density in the right upper lobe not as well visualized. Electronically Signed   By: Rolm Baptise M.D.   On: 01/18/2017 07:27     ASSESSMENT AND PLAN:   Sarah [name]  is a 83 y.o. female with a known history of chronic Alzheimer's dementia, hypertension, living in a facility is brought into the ED for acute hypoxia. Patient's pulse ox was at 80% on room air  # Acute hypoxic respiratory failure  due to right upper lobe pneumonia/mass. On IV antibiotics.  Waiting for cultures.  Wean oxygen as tolerated.  Nebulizers.  #Essential hypertension continue home medication hydrochlorothiazide  #Chronic Alzheimer's dementia Continue home medication Aricept and Namenda  #Right upper lobe mass-likely cancer Patient's son is aware of that, did not want to pursue further diagnostic studies or investigations  All the records are reviewed and case discussed with Care Management/Social Worker Management plans discussed with the patient, family and they are in agreement.  CODE STATUS: DNR  DVT Prophylaxis: SCDs  TOTAL TIME TAKING CARE OF THIS PATIENT: 35 minutes.   POSSIBLE D/C IN 1-2  DAYS, DEPENDING ON CLINICAL CONDITION.  Leia Alf Sarah [name] M.D on 01/19/2017 at 2:08 PM  Between 7am to  6pm - Pager - [phone]  After 6pm go to [email or web address] - password [email or web address] [facility]  Office  [phone]  CC: Primary care physician; Raelyn Number, MD  Note: This dictation was prepared with Dragon dictation along with smaller phrase technology. Any transcriptional errors that result from this process are unintentional.

## 2017-01-19 NOTE — Plan of Care (Signed)
  Progressing Education: Knowledge of General Education information will improve 01/19/2017 1111 - Progressing by Rowe Robert, RN Health Behavior/Discharge Planning: Ability to manage health-related needs will improve 01/19/2017 1111 - Progressing by Rowe Robert, RN Clinical Measurements: Ability to maintain clinical measurements within normal limits will improve 01/19/2017 1111 - Progressing by Rowe Robert, RN Will remain free from infection 01/19/2017 1111 - Progressing by Rowe Robert, RN Diagnostic test results will improve 01/19/2017 1111 - Progressing by Rowe Robert, RN Respiratory complications will improve 01/19/2017 1111 - Progressing by Rowe Robert, RN Cardiovascular complication will be avoided 01/19/2017 1111 - Progressing by Rowe Robert, RN Nutrition: Adequate nutrition will be maintained 01/19/2017 1111 - Progressing by Rowe Robert, RN Pain Managment: General experience of comfort will improve 01/19/2017 1111 - Progressing by Rowe Robert, RN Safety: Ability to remain free from injury will improve 01/19/2017 1111 - Progressing by Rowe Robert, RN Skin Integrity: Risk for impaired skin integrity will decrease 01/19/2017 1111 - Progressing by Rowe Robert, RN

## 2017-01-19 NOTE — Consult Note (Signed)
[facility] Nurse wound consult note Reason for Consult:Bilateral unstageable pressure injury to heels, present on admission Wound type:unstageable pressure injury Pressure Injury POA: Yes Measurement:LEft medial heel:  1 cm x 1 cm intact dry eschar Right heel:  2 cm x 2 cm x 0.2 cm deep tissue injury with nonintact center.  Will cover with silicone border foam dressing Wound JQB:HALPF heel:  Maroon discoloration with ruptured blister to center Drainage (amount, consistency, odor) right heel:  Minimal serosanguinous Left heel is dry and intact Periwound: maroon discoloration right heel Dressing procedure/placement/frequency: Left heel open to air Right heel silicone foam Offload bilateral heels with Prevalon boot Will not follow at this time.  Please re-consult if needed.  Domenic Moras RN BSN [place] Pager [phone]

## 2017-01-20 ENCOUNTER — Inpatient Hospital Stay (HOSPITAL_COMMUNITY)
Admit: 2017-01-20 | Discharge: 2017-01-20 | Disposition: A | Discharge status: Home / Self Care | Payer: Medicare HMO | Attending: Internal Medicine | Admitting: Internal Medicine

## 2017-01-20 DIAGNOSIS — I509 Heart failure, unspecified: Secondary | ICD-10-CM

## 2017-01-20 LAB — URINE CULTURE: SPECIAL REQUESTS: NORMAL

## 2017-01-20 MED ORDER — TRAZODONE HCL 50 MG PO TABS: 25.0000 mg | ORAL_TABLET | Freq: Every evening | ORAL | Status: DC | PRN

## 2017-01-20 MED ORDER — SODIUM CHLORIDE 0.9 % IV SOLN
3.0000 g | Freq: Four times a day (QID) | INTRAVENOUS | Status: DC
  Administered 2017-01-20 – 2017-01-21 (×4): 3 g via INTRAVENOUS
  Filled 2017-01-20 (×7): qty 3

## 2017-01-20 MED ORDER — VANCOMYCIN HCL IN DEXTROSE 750-5 MG/150ML-% IV SOLN
750.0000 mg | INTRAVENOUS | Status: DC
  Administered 2017-01-20: 750 mg via INTRAVENOUS
  Filled 2017-01-20: qty 150

## 2017-01-20 NOTE — Consult Note (Addendum)
[facility] Infectious Disease     Reason for Consult:Staph bacteremia   Referring Physician: Boykin Reaper Date of Admission:  01/18/2017   Active Problems:   Acute respiratory failure with hypoxia ([place])   Pressure injury of skin   HPI: PARNIKA TWETEN is a 83 y.o. female admitted with SOB. She has advanced dementia, lives in a facility. On admit sats 805.  CXR with upper lobe mass - previously known.  On admit no fever, wbc 11. Started on ctx and azithro. BCX + CNS ([place]) in 2/2 sets. Developed fever, 12/4 and started on vanco.  Past Medical History:  Diagnosis Date  . Dementia   . Fall   . Hyperlipidemia   . Hypertension   . Macular degeneration   . Osteoporosis    History reviewed. No pertinent surgical history. Social History   Tobacco Use  . Smoking status: Former Research scientist (life sciences)  . Smokeless tobacco: Never Used  Substance Use Topics  . Alcohol use: No  . Drug use: No   Family History  Problem Relation Age of Onset  . CVA Mother   . Dementia Father     Allergies: No Known Allergies  Current antibiotics: Antibiotics Given (last 72 hours)    Date/Time Action Medication Dose Rate   01/18/17 1057 New Bag/Given   cefTRIAXone (ROCEPHIN) 1 g in dextrose 5 % 50 mL IVPB - Premix 1 g 100 mL/hr   01/18/17 1120 New Bag/Given   azithromycin (ZITHROMAX) 500 mg in dextrose 5 % 250 mL IVPB 500 mg 250 mL/hr   01/19/17 0636 New Bag/Given   vancomycin (VANCOCIN) IVPB 750 mg/150 ml premix 750 mg 150 mL/hr   01/19/17 1035 New Bag/Given   cefTRIAXone (ROCEPHIN) 1 g in dextrose 5 % 50 mL IVPB 1 g 100 mL/hr   01/19/17 1037 New Bag/Given   azithromycin (ZITHROMAX) 500 mg in dextrose 5 % 250 mL IVPB 500 mg 250 mL/hr   01/20/17 0955 New Bag/Given   cefTRIAXone (ROCEPHIN) 1 g in dextrose 5 % 50 mL IVPB 1 g 100 mL/hr   01/20/17 1028 New Bag/Given   azithromycin (ZITHROMAX) 500 mg in dextrose 5 % 250 mL IVPB 500 mg 250 mL/hr   01/20/17 1348 New Bag/Given   vancomycin (VANCOCIN) IVPB 750  mg/150 ml premix 750 mg 150 mL/hr      MEDICATIONS: . aspirin EC  81 mg Oral Daily  . cholecalciferol  1,000 Units Oral Daily  . donepezil  5 mg Oral QHS  . enoxaparin (LOVENOX) injection  40 mg Subcutaneous Q24H  . escitalopram  10 mg Oral QHS  . hydrochlorothiazide  25 mg Oral Daily  . mouth rinse  15 mL Mouth Rinse BID  . memantine  10 mg Oral BID  . methimazole  10 mg Oral Daily  . mirtazapine  7.5 mg Oral QHS  . multivitamin-lutein  1 capsule Oral QHS  . potassium chloride SA  20 mEq Oral Daily  . risperiDONE  0.25 mg Oral BID    Review of Systems - unable to obtain  OBJECTIVE: Temp:  [97.5 F (36.4 C)-101.4 F (38.6 C)] 98.5 F (36.9 C) (12/04 1307) Pulse Rate:  [62-139] 62 (12/04 1307) Resp:  [16-18] 18 (12/04 1307) BP: (117-146)/(34-57) 117/34 (12/04 1319) SpO2:  [94 %-100 %] 100 % (12/04 1307) Physical Exam  Constitutional:  Frail, lying in bed, opens eyes and follows simple commands HENT: [place]/AT, PERRLA, no scleral icterus Mouth/Throat: Oropharynx is clear and dry.  Cardiovascular: Normal rate, regular rhythm  and normal heart sounds.  Pulmonary/Chest: bil rhonchi Neck = supple, no nuchal rigidity Abdominal: Soft. Bowel sounds are normal.  exhibits no distension. There is no tenderness.  Lymphadenopathy: no cervical adenopathy. No axillary adenopathy Neurological: opens eyes and follows simple commands Ext no edema Skin: Bil heels with pressure changes, no drainage,  Psychiatric: a normal mood and affect.  behavior is normal.    LABS: Results for orders placed or performed during the hospital encounter of 01/18/17 (from the past 48 hour(s))  MRSA PCR Screening     Status: None   Collection Time: 01/18/17  4:17 PM  Result Value Ref Range   MRSA by PCR NEGATIVE NEGATIVE    Comment:        The GeneXpert MRSA Assay (FDA approved for NASAL specimens only), is one component of a comprehensive MRSA colonization surveillance program. It is not intended to  diagnose MRSA infection nor to guide or monitor treatment for MRSA infections.   CBC     Status: Abnormal   Collection Time: 01/19/17  4:23 AM  Result Value Ref Range   WBC 9.7 3.6 - 11.0 K/uL   RBC 3.17 (L) 3.80 - 5.20 MIL/uL   Hemoglobin 10.0 (L) 12.0 - 16.0 g/dL   HCT 30.4 (L) 35.0 - 47.0 %   MCV 95.9 80.0 - 100.0 fL   MCH 31.6 26.0 - 34.0 pg   MCHC 33.0 32.0 - 36.0 g/dL   RDW 14.4 11.5 - 14.5 %   Platelets 237 150 - 440 K/uL  Comprehensive metabolic panel     Status: Abnormal   Collection Time: 01/19/17  4:23 AM  Result Value Ref Range   Sodium 144 135 - 145 mmol/L   Potassium 3.9 3.5 - 5.1 mmol/L   Chloride 110 101 - 111 mmol/L   CO2 27 22 - 32 mmol/L   Glucose, Bld 108 (H) 65 - 99 mg/dL   BUN 20 6 - 20 mg/dL   Creatinine, Ser 0.82 0.44 - 1.00 mg/dL   Calcium 8.4 (L) 8.9 - 10.3 mg/dL   Total Protein 6.4 (L) 6.5 - 8.1 g/dL   Albumin 2.8 (L) 3.5 - 5.0 g/dL   AST 24 15 - 41 U/L   ALT 15 14 - 54 U/L   Alkaline Phosphatase 92 38 - 126 U/L   Total Bilirubin 0.3 0.3 - 1.2 mg/dL   GFR calc non Af Amer >60 >60 mL/min   GFR calc Af Amer >60 >60 mL/min    Comment: (NOTE) The eGFR has been calculated using the CKD EPI equation. This calculation has not been validated in all clinical situations. eGFR's persistently <60 mL/min signify possible Chronic Kidney Disease.    Anion gap 7 5 - 15   No components found for: ESR, C REACTIVE PROTEIN MICRO: Recent Results (from the past 720 hour(s))  Culture, blood (Routine x 2)     Status: Abnormal (Preliminary result)   Collection Time: 01/18/17  7:04 AM  Result Value Ref Range Status   Specimen Description BLOOD BLOOD RIGHT HAND  Final   Special Requests   Final    BOTTLES DRAWN AEROBIC AND ANAEROBIC Blood Culture adequate volume   Culture  Setup Time   Final    GRAM POSITIVE COCCI IN BOTH AEROBIC AND ANAEROBIC BOTTLES CRITICAL RESULT CALLED TO, READ BACK BY AND VERIFIED WITH: MATT MCBANE AT 3016 ON 01/19/17 BY QSD.Marland KitchenMarland KitchenWellstar Douglas Hospital     Culture (A)  Final    STAPHYLOCOCCUS HOMINIS SUSCEPTIBILITIES TO FOLLOW Performed at  [facility], [place] [address], [place], [place] [address]    Report Status PENDING  Incomplete  Blood Culture ID Panel (Reflexed)     Status: Abnormal   Collection Time: 01/18/17  7:04 AM  Result Value Ref Range Status   Enterococcus species NOT DETECTED NOT DETECTED Final   Listeria monocytogenes NOT DETECTED NOT DETECTED Final   Staphylococcus species DETECTED (A) NOT DETECTED Final    Comment: Methicillin (oxacillin) resistant coagulase negative staphylococcus. Possible blood culture contaminant (unless isolated from more than one blood culture draw or clinical case suggests pathogenicity). No antibiotic treatment is indicated for blood  culture contaminants. CRITICAL RESULT CALLED TO, READ BACK BY AND VERIFIED WITH: MATT MCBANE ON 01/19/17 AT 0523 QSD    Staphylococcus aureus NOT DETECTED NOT DETECTED Final   Methicillin resistance DETECTED (A) NOT DETECTED Final    Comment: CRITICAL RESULT CALLED TO, READ BACK BY AND VERIFIED WITH: MATT MCBANE ON 01/19/17 AT 0523 QSD    Streptococcus species NOT DETECTED NOT DETECTED Final   Streptococcus agalactiae NOT DETECTED NOT DETECTED Final   Streptococcus pneumoniae NOT DETECTED NOT DETECTED Final   Streptococcus pyogenes NOT DETECTED NOT DETECTED Final   Acinetobacter baumannii NOT DETECTED NOT DETECTED Final   Enterobacteriaceae species NOT DETECTED NOT DETECTED Final   Enterobacter cloacae complex NOT DETECTED NOT DETECTED Final   Escherichia coli NOT DETECTED NOT DETECTED Final   Klebsiella oxytoca NOT DETECTED NOT DETECTED Final   Klebsiella pneumoniae NOT DETECTED NOT DETECTED Final   Proteus species NOT DETECTED NOT DETECTED Final   Serratia marcescens NOT DETECTED NOT DETECTED Final   Haemophilus influenzae NOT DETECTED NOT DETECTED Final   Neisseria meningitidis NOT DETECTED NOT DETECTED Final   Pseudomonas aeruginosa NOT DETECTED NOT  DETECTED Final   Candida albicans NOT DETECTED NOT DETECTED Final   Candida glabrata NOT DETECTED NOT DETECTED Final   Candida krusei NOT DETECTED NOT DETECTED Final   Candida parapsilosis NOT DETECTED NOT DETECTED Final   Candida tropicalis NOT DETECTED NOT DETECTED Final  Culture, blood (Routine x 2)     Status: Abnormal (Preliminary result)   Collection Time: 01/18/17  7:10 AM  Result Value Ref Range Status   Specimen Description BLOOD BLOOD LEFT FOREARM  Final   Special Requests   Final    BOTTLES DRAWN AEROBIC AND ANAEROBIC Blood Culture adequate volume   Culture  Setup Time   Final    GRAM POSITIVE COCCI IN BOTH AEROBIC AND ANAEROBIC BOTTLES CRITICAL VALUE NOTED.  VALUE IS CONSISTENT WITH PREVIOUSLY REPORTED AND CALLED VALUE.    Culture STAPHYLOCOCCUS HOMINIS (A)  Final   Report Status PENDING  Incomplete  Urine Culture     Status: None (Preliminary result)   Collection Time: 01/18/17  7:17 AM  Result Value Ref Range Status   Specimen Description URINE, CLEAN CATCH  Final   Special Requests Normal  Final   Culture   Final    CULTURE REINCUBATED FOR BETTER GROWTH Performed at [facility], [place] [address], [place], [place] [address]    Report Status PENDING  Incomplete  MRSA PCR Screening     Status: None   Collection Time: 01/18/17  4:17 PM  Result Value Ref Range Status   MRSA by PCR NEGATIVE NEGATIVE Final    Comment:        The GeneXpert MRSA Assay (FDA approved for NASAL specimens only), is one component of a comprehensive MRSA colonization surveillance program. It is not intended  to diagnose MRSA infection nor to guide or monitor treatment for MRSA infections.     IMAGING: Dg Chest 1 View  Result Date: 01/18/2017 CLINICAL DATA:  Shortness of Breath EXAM: CHEST 1 VIEW COMPARISON:  06/30/2016 FINDINGS: Previously seen ground-glass masslike opacity in the right upper lobe on CT not as well visualized by plain film. No visible confluent airspace opacities  or effusions. Heart is normal size. IMPRESSION: No acute cardiopulmonary disease. Previously seen ground-glass masslike density in the right upper lobe not as well visualized. Electronically Signed   By: Rolm Baptise M.D.   On: 01/18/2017 07:27    Assessment:   ALYSSE RATHE is a 83 y.o. female with advanced dementia, know upper lobe mass admitted with sob and found to have Staph hominis bacteremia. She remains on O2, had one fever since admission. Does not have a pacemaker or other intravascular device. She has non infected appearing bil heel early pressure changes.  I suspect she mainly has repeated aspiration events as cause of fever.  Would likely consider the CNS bacteremia contaminant at this point.   Recommendations Can dc vanco. I have repeated bcx Change ceftriaxone to unasyn for possible aspiration.  Cont azithro x 3 days.  Thank you very much for allowing me to participate in the care of this patient. Please call with questions.   Cheral Marker. Ola Spurr, MD

## 2017-01-20 NOTE — Consult Note (Signed)
Consultation Note Date: 01/20/2017   Patient Name: Sarah [name]  DOB: 1924-12-29  MRN: [id number]  Age / Sex: 83 y.o., female  PCP: Raelyn Number, MD Referring Physician: Hillary Bow, MD  Reason for Consultation: Establishing goals of care  HPI/Patient Profile: Sarah [name]  is a 83 y.o. female with a known history of chronic Alzheimer's dementia, hypertension, living in a facility is brought into the ED for acute hypoxia. Patient is legally blind.   Clinical Assessment and Goals of Care: Sarah [name] is currently resting in bed at this time with eyes closed. Went to see her earlier and she had eaten half of 2 different cups of ice cream, had not eaten her eggs. She denied pain. She stated her name is Sarah [name], she states she is 83 years old and is not able to tell me where she is.   Her sister is at bedside. She states Sarah [name] has been widowed since the early 90's, and would travel with friends. She states 2 years ago, she would pick her up and take her to eat, and noticed she coughed with eating a frosty and some foods. She states 5 months ago, it was a difference in night and day. She states she had been able to walk with a walker, but changed to a wheel chair. She states she was able to converse with her prior to 5 months ago. She states she was advised that the treatment team has just changed her antibiotics, and will see how she does overnight. She states Sarah [name] has lived a good life and is ready to let her go, that she knows her sister will be in a better place. She states son should be arriving shortly.   4:00: Met with son and patient's sister at bedside. Patient is sleeping.  He states he was advised today that if the antibiotics do not work, she would be transitioned to comfort care. He states he understands this, and has been preparing for this for years. Her lung mass was diagnosed within the last  year.  He states his mother's quality of life has not been good. She has been in a wheel chair, but she made the decision to place herself in it instead of using a walker. Per nursing, patient has been eating bites today.    Son is Media planner.     SUMMARY OF RECOMMENDATIONS   Monitor patient overnight and reevaluate tomorrow for improvement.  If no improvement, plans to transition to comfort care tomorrow.   Code Status/Advance Care Planning:  DNR    Symptom Management:   No complaints  Palliative Prophylaxis:   Aspiration  Prognosis:   < 6 months Dementia, lung mass, PNA, albumin 2.8.   Discharge Planning: To Be Determined      Primary Diagnoses: Present on Admission: . Acute respiratory failure with hypoxia ([facility])   I have reviewed the medical record, interviewed the patient and family, and examined the patient. The following aspects are pertinent.  Past Medical History:  Diagnosis  Date  . Dementia   . Fall   . Hyperlipidemia   . Hypertension   . Macular degeneration   . Osteoporosis    Social History   Socioeconomic History  . Marital status: Widowed    Spouse name: None  . Number of children: None  . Years of education: None  . Highest education level: None  Social Needs  . Financial resource strain: None  . Food insecurity - worry: None  . Food insecurity - inability: None  . Transportation needs - medical: None  . Transportation needs - non-medical: None  Occupational History  . None  Tobacco Use  . Smoking status: Former Research scientist (life sciences)  . Smokeless tobacco: Never Used  Substance and Sexual Activity  . Alcohol use: No  . Drug use: No  . Sexual activity: No  Other Topics Concern  . None  Social History Narrative  . None   Family History  Problem Relation Age of Onset  . CVA Mother   . Dementia Father    Scheduled Meds: . aspirin EC  81 mg Oral Daily  . cholecalciferol  1,000 Units Oral Daily  . donepezil  5 mg Oral QHS  .  enoxaparin (LOVENOX) injection  40 mg Subcutaneous Q24H  . escitalopram  10 mg Oral QHS  . hydrochlorothiazide  25 mg Oral Daily  . mouth rinse  15 mL Mouth Rinse BID  . memantine  10 mg Oral BID  . methimazole  10 mg Oral Daily  . mirtazapine  7.5 mg Oral QHS  . multivitamin-lutein  1 capsule Oral QHS  . potassium chloride SA  20 mEq Oral Daily  . risperiDONE  0.25 mg Oral BID   Continuous Infusions: . azithromycin Stopped (01/20/17 1203)  . cefTRIAXone (ROCEPHIN)  IV Stopped (01/20/17 1028)  . Vancomycin 750 mg (01/20/17 1348)   PRN Meds:.acetaminophen, albuterol, loperamide, ondansetron, polyethylene glycol, traZODone Medications Prior to Admission:  Prior to Admission medications   Medication Sig Start Date End Date Taking? Authorizing Provider  acetaminophen (TYLENOL) 500 MG tablet Take 500 mg by mouth 2 (two) times daily.   Yes [provider]  amLODipine (NORVASC) 10 MG tablet Take 10 mg by mouth daily.    Yes [provider]  aspirin EC 81 MG EC tablet Take 1 tablet (81 mg total) by mouth daily. 07/05/16  Yes Mody, Ulice Bold, MD  Cholecalciferol (VITAMIN D-1000 MAX ST) 1000 units tablet Take 1 tablet by mouth daily.   Yes [provider]  Cranberry 450 MG CAPS Take 450 mg by mouth daily.    Yes [provider]  donepezil (ARICEPT) 5 MG tablet Take 5 mg by mouth at bedtime.   Yes [provider]  escitalopram (LEXAPRO) 10 MG tablet Take 10 mg by mouth at bedtime.   Yes [provider]  hydrochlorothiazide (HYDRODIURIL) 25 MG tablet Take 25 mg by mouth daily.   Yes [provider]  loperamide (IMODIUM) 2 MG capsule Take 2 mg by mouth every 6 (six) hours as needed for diarrhea or loose stools.   Yes [provider]  memantine (NAMENDA) 10 MG tablet Take 10 mg by mouth 2 (two) times daily.  08/06/16  Yes [provider]  methimazole (TAPAZOLE) 10 MG tablet Take 10 mg by mouth daily.   Yes [provider]  mirtazapine (REMERON) 15 MG tablet Take 7.5 mg by mouth at bedtime. 08/13/16  Yes [provider]  Multiple Vitamins-Minerals (PRESERVISION AREDS) TABS Take 1 capsule by  mouth 2 (two) times daily.   Yes [provider]  potassium chloride SA (K-DUR,KLOR-CON) 20 MEQ tablet Take 20 mEq by mouth daily.    Yes [provider]  risperiDONE (RISPERDAL) 0.25 MG tablet Take 0.25 mg by mouth 2 (two) times daily.   Yes [provider]  traZODone (DESYREL) 50 MG tablet Take 12.5 mg by mouth every 4 (four) hours as needed for sleep.   Yes [provider]  acetaminophen (TYLENOL) 325 MG tablet Take 650 mg by mouth every 4 (four) hours as needed for mild pain, fever or headache.     [provider]  bacitracin ointment Apply to affected area daily Patient not taking: Reported on 10/29/2016 09/14/16 09/14/17  Loney Hering, MD  ondansetron (ZOFRAN ODT) 4 MG disintegrating tablet Take 1 tablet (4 mg total) by mouth every 8 (eight) hours as needed for nausea or vomiting. Patient not taking: Reported on 01/18/2017 06/30/16   Rudene Re, MD  ondansetron [facility]) 4 MG tablet Take 1 tablet (4 mg total) by mouth every 8 (eight) hours as needed for nausea or vomiting. 10/29/16   Rudene Re, MD   No Known Allergies Review of Systems  All other systems reviewed and are negative.   Physical Exam  Constitutional: No distress.  HENT:  Head: Normocephalic.  Pulmonary/Chest: Effort normal.  Skin: Skin is warm and dry.    Vital Signs: BP (!) 117/34 (BP Location: Right Arm)   Pulse 62   Temp 98.5 F (36.9 C) (Oral)   Resp 18   Ht 5' 2"  (1.575 m)   Wt 57.7 kg (127 lb 3.2 oz)   SpO2 100%   BMI 23.27 kg/m  Pain Assessment: PAINAD   Pain Score: Asleep   SpO2: SpO2: 100 % O2 Device:SpO2: 100 % O2 Flow Rate: .O2 Flow Rate (L/min): 2 L/min  IO: Intake/output summary:   Intake/Output Summary (Last 24 hours) at 01/20/2017  1453 Last data filed at 01/20/2017 0600 Gross per 24 hour  Intake 300 ml  Output -  Net 300 ml    LBM: Last BM Date: 01/19/17 Baseline Weight: Weight: 60 kg (132 lb 4.4 oz) Most recent weight: Weight: 57.7 kg (127 lb 3.2 oz)     Palliative Assessment/Data: 20%     Time In: 3:20 Time Out: 4:30 Time Total: 70 min Greater than 50%  of this time was spent counseling and coordinating care related to the above assessment and plan.  Signed by: Asencion Gowda, NP 01/20/2017 4:34 PM Office: [phone] 7am-7pm  Please see Amion for pager number and availability  Call primary team after hours   Please contact Palliative Medicine Team phone at [phone] for questions and concerns.  For individual provider: See [name] Evans

## 2017-01-20 NOTE — Consult Note (Addendum)
Pharmacy Antibiotic Note  Sarah [name] is a 83 y.o. female admitted on 01/18/2017 with SOB, aspiration PNA.  Pharmacy has been consulted for unasyn dosing. Pt currently on vancomycin due to [facility] + CNS ([place]) in 2/2 sets, but per ID considering a contaminate at this point.  Plan: d/c vancomycin. Unasyn 3g q 6 hrs to cover for aspiration PNA  Follow up repeat BCx  Height: 5\' 2"  (157.5 cm) Weight: 127 lb 3.2 oz (57.7 kg) IBW/kg (Calculated) : 50.1  Temp (24hrs), Avg:98.5 F (36.9 C), Min:97.5 F (36.4 C), Max:101.4 F (38.6 C)  Recent Labs  Lab 01/18/17 0704 01/18/17 1105 01/19/17 0423  WBC 11.2*  --  9.7  CREATININE  --  0.95 0.82  LATICACIDVEN 1.1 1.2  --     Estimated Creatinine Clearance: 36.1 mL/min (by C-G formula based on SCr of 0.82 mg/dL).    No Known Allergies  Antimicrobials this admission: ceftriaxone 12/2 >> 12/4 Azithromycin 12/2  >>  Vancomycin 12/3>>12/4 unasyn 12/4>>  Dose adjustments this admission:   Microbiology results: 12/2 BCx: 3 of 4 w/ Staph species, MEC-A+ 12/2 UCx: pending  12/3 BCID GPC x2 anaerobic. Staph spp. mec A(+) 12/2 MRSA PCR: (-) 12/4 Bcx:   Thank you for allowing pharmacy to be a part of this patient's care.  Ramond Dial, Pharm.D, BCPS Clinical Pharmacist 01/20/2017 3:20 PM

## 2017-01-20 NOTE — Progress Notes (Signed)
[facility] [facility] [place] NAME: Sarah [name]    MR#:  [id number]  DATE OF BIRTH:  Apr 28, 1924  SUBJECTIVE:  CHIEF COMPLAINT:   Chief Complaint  Patient presents with  . Shortness of Breath   Patient has advanced dementia.  Poor historian.  On 2 L oxygen. Temp - 101.4  Poor PO intake.  REVIEW OF SYSTEMS:    Review of Systems  Unable to perform ROS: Dementia   DRUG ALLERGIES:  No Known Allergies  VITALS:  Blood pressure (!) 139/57, pulse 71, temperature (!) 101.4 F (38.6 C), temperature source Oral, resp. rate 16, height 5\' 2"  (1.575 m), weight 57.7 kg (127 lb 3.2 oz), SpO2 99 %.  PHYSICAL EXAMINATION:   Physical Exam  GENERAL:  83 y.o.-year-old patient lying in the bed with no acute distress.  EYES: Pupils equal, round, reactive to light and accommodation. No scleral icterus. Extraocular muscles intact.  HEENT: Head atraumatic, normocephalic. Oropharynx and nasopharynx clear.  NECK:  Supple, no jugular venous distention. No thyroid enlargement, no tenderness.  LUNGS: Normal breath sounds bilaterally, no wheezing, rales, rhonchi. No use of accessory muscles of respiration.  CARDIOVASCULAR: S1, S2 normal. No murmurs, rubs, or gallops.  ABDOMEN: Soft, nontender, nondistended. Bowel sounds present. No organomegaly or mass.  EXTREMITIES: No cyanosis, clubbing or edema b/l.    NEUROLOGIC: Cranial nerves II through XII are intact. No focal Motor or sensory deficits b/l.   PSYCHIATRIC: The patient is alert and awake. Pleasantly confused SKIN: No obvious rash, lesion, or ulcer.   LABORATORY PANEL:   CBC Recent Labs  Lab 01/19/17 0423  WBC 9.7  HGB 10.0*  HCT 30.4*  PLT 237   ------------------------------------------------------------------------------------------------------------------ Chemistries  Recent Labs  Lab 01/19/17 0423  NA 144  K 3.9  CL 110  CO2 27  GLUCOSE 108*  BUN 20  CREATININE 0.82  CALCIUM 8.4*  AST 24   ALT 15  ALKPHOS 92  BILITOT 0.3   ------------------------------------------------------------------------------------------------------------------  Cardiac Enzymes Recent Labs  Lab 01/18/17 0800  TROPONINI 0.04*   ------------------------------------------------------------------------------------------------------------------  RADIOLOGY:  No results found.   ASSESSMENT AND PLAN:   Sarah [name]  is a 83 y.o. female with a known history of chronic Alzheimer's dementia, hypertension, living in a facility is brought into the ED for acute hypoxia. Patient's pulse ox was at 80% on room air  #Coagulase-negative staph bacteremia Initially thought to be contaminant.  But multiple bottles are growing the bacteria.  Will start vancomycin.  Consult infectious disease.  Check echocardiogram.  # Acute hypoxic respiratory failure  due to right upper lobe pneumonia/mass. On IV antibiotics.  Waiting for cultures.  Wean oxygen as tolerated.  Nebulizers. Still febrile today.  #Essential hypertension continue home medication hydrochlorothiazide  #Chronic Alzheimer's dementia Continue home medication Aricept and Namenda  #Right upper lobe mass-likely cancer Patient's son is aware of that, did not want to pursue further diagnostic studies or investigations  All the records are reviewed and case discussed with Care Management/Social Worker   I have tried calling the son yesterday and today and left voicemail's.  CODE STATUS: DNR  DVT Prophylaxis: SCDs  TOTAL TIME TAKING CARE OF THIS PATIENT: 35 minutes.   POSSIBLE D/C IN 1-2 DAYS, DEPENDING ON CLINICAL CONDITION.  Leia Alf Harish Bram M.D on 01/20/2017 at 10:39 AM  Between 7am to 6pm - Pager - [phone]  After 6pm go to [email or web address] - password [email or web address] [facility]  Office  [phone]  CC: Primary care physician; Raelyn Number, MD  Note: This dictation was prepared with Dragon dictation along with  smaller phrase technology. Any transcriptional errors that result from this process are unintentional.

## 2017-01-20 NOTE — Progress Notes (Signed)
  Speech Language Pathology Treatment: Dysphagia  Patient Details Name: Sarah [name] MRN: [id number] DOB: 09/06/1924 Today's Date: 01/20/2017 Time: 7616-0737 SLP Time Calculation (min) (ACUTE ONLY): 45 min  Assessment / Plan / Recommendation Clinical Impression  Pt seen for ongoing toleration of diet and safety w/ oral intake secondary to increased drowsiness reported by NSG. Pt appeared to present w/ min more shut-down behavior(eyes closed during tx session) but she responded readily to tactile stim of spoon to lips. Noted min increased oral phase time w/ overall decreased alertness to the task of oral intake and swallowing. Suspect presentation is impacted by Cognitive decline - baseline Dementia dx and her current illness. This can increase dysphagia and risk for aspiration. Pt exhibited both oral and pharyngeal signs of dysphagia and min-mod s/s of pharyngeal phase deficits. Full feeding support and tactile/verbal cues required at each bolus presentation. Recommend diet consistency downgrade to a Dysphagia level 1(puree) w/ Nectar consistency liquids - NO Straw; strict aspiration precautions and feeding support. Recommend not giving po's if pt is not able to fully awaken/alert w/ tactile/verbal stim to safely participate in swallowing. NSG updated. ST services will continue to follow pt's status and give trials to upgrade as safely indicated. Noted Palliative Care following.     HPI HPI: Pt is a 83 y.o. female with a known history of chronic Alzheimer's dementia, hypertension, living in a facility is brought into the ED for acute hypoxia. Patient's pulse ox was at 80% on room air. Chest x-ray did not reveal any infiltrate but has revealed right upper lobe mass. Patient's son who is the healthcare power of attorney  is aware of the mass and doesn't want to pursue any other investigations as patient is 52 ys/o.  Pt has been more drowsy today w/ eyes closed; not taking much po's w/ NSG except w/  medications.       SLP Plan  Continue with current plan of care       Recommendations  Diet recommendations: Dysphagia 1 (puree);Nectar-thick liquid(downgrade of diet consistency) Liquids provided via: Teaspoon;No straw Medication Administration: Crushed with puree(as able) Supervision: Full supervision/cueing for compensatory strategies;Staff to assist with self feeding Compensations: Minimize environmental distractions;Slow rate;Small sips/bites;Lingual sweep for clearance of pocketing;Multiple dry swallows after each bite/sip;Follow solids with liquid Postural Changes and/or Swallow Maneuvers: Seated upright 90 degrees;Upright 30-60 min after meal(monitor for alertness for eating/drinking)                General recommendations: (Dietician f/u) Oral Care Recommendations: Oral care BID;Staff/trained caregiver to provide oral care Follow up Recommendations: None(TBD) SLP Visit Diagnosis: Dysphagia, oropharyngeal phase (R13.12)(cognitive decline) Plan: Continue with current plan of care       GO                Sarah [name], [place], CCC-SLP Sarah [name] 01/20/2017, 5:08 PM

## 2017-01-20 NOTE — Progress Notes (Signed)
Pharmacy Antibiotic Note  Sarah [name] is a 83 y.o. female admitted on 01/18/2017 with BCID (+) for Staph spp mec A(+).  Pharmacy has been consulted for vancomycin dosing.  Plan: Patient received Vancomycin 750mg  IV x 1 on 01/19/17 at 0636. Order was discontinued after dose. Now Restarting Vancomycin.  DW 58kg  Vd 41L kei 0.034 hr-1  T1/2 20 hours Will order Vancomycin 750 mg q 24 hours. Level before 5th dose. Goal trough 15-20.    Height: 5\' 2"  (157.5 cm) Weight: 127 lb 3.2 oz (57.7 kg) IBW/kg (Calculated) : 50.1  Temp (24hrs), Avg:98.8 F (37.1 C), Min:97.5 F (36.4 C), Max:101.4 F (38.6 C)  Recent Labs  Lab 01/18/17 0704 01/18/17 1105 01/19/17 0423  WBC 11.2*  --  9.7  CREATININE  --  0.95 0.82  LATICACIDVEN 1.1 1.2  --     Estimated Creatinine Clearance: 36.1 mL/min (by C-G formula based on SCr of 0.82 mg/dL).    No Known Allergies  Antimicrobials this admission: Ceftriaxone, azithromycin, vancomycin 12/3   >>    >>   Dose adjustments this admission:   Microbiology results: 12/2 BCx: 3 of 4 w/ Staph species, MEC-A+ 12/2 UCx: pending  12/3 BCID GPC x2 anaerobic. Staph spp. mec A(+) 12/2 MRSA PCR: (-)  Thank you for allowing pharmacy to be a part of this patient's care.  Antonia Culbertson A 01/20/2017 11:11 AM

## 2017-01-20 NOTE — Progress Notes (Signed)
Contacts  Son [name] Perdue - [phone]  Sister - Patton Salles [phone]

## 2017-01-20 NOTE — Plan of Care (Signed)
Palliative medicine consult placed for Ms. Owens Shark. Ms. Ivie is resting in bed. She has eaten half of 2 different cups of ice cream, has not eaten her eggs. She denies pain. She states her name is Sarah [name], she states she is 83 years old and is not able to tell me where she is. Attempted to call son, message left. Attempted to call patient's sister to inquire about additional contact information for patient's son unsuccessfully, no answer.

## 2017-01-21 DIAGNOSIS — Z515 Encounter for palliative care: Secondary | ICD-10-CM

## 2017-01-21 DIAGNOSIS — R0600 Dyspnea, unspecified: Secondary | ICD-10-CM

## 2017-01-21 DIAGNOSIS — R05 Cough: Secondary | ICD-10-CM

## 2017-01-21 DIAGNOSIS — J9601 Acute respiratory failure with hypoxia: Principal | ICD-10-CM

## 2017-01-21 DIAGNOSIS — Z7189 Other specified counseling: Secondary | ICD-10-CM

## 2017-01-21 LAB — CULTURE, BLOOD (ROUTINE X 2)
SPECIAL REQUESTS: ADEQUATE
Special Requests: ADEQUATE

## 2017-01-21 LAB — ECHOCARDIOGRAM COMPLETE
Height: 62 in
Weight: 2035.2 oz

## 2017-01-21 LAB — CREATININE, SERUM
CREATININE: 0.84 mg/dL (ref 0.44–1.00)
GFR calc Af Amer: >60 mL/min (ref >60)
GFR calc non Af Amer: 59 mL/min — ABNORMAL LOW (ref >60)

## 2017-01-21 MED ORDER — GLYCOPYRROLATE 0.2 MG/ML IJ SOLN
0.1000 mg | Freq: Three times a day (TID) | INTRAMUSCULAR | Status: DC
  Administered 2017-01-21: 0.1 mg via INTRAVENOUS
  Filled 2017-01-21 (×3): qty 0.5

## 2017-01-21 MED ORDER — GLYCOPYRROLATE 0.2 MG/ML IJ SOLN: 0.1000 mg | Freq: Three times a day (TID) | INTRAMUSCULAR | Status: AC

## 2017-01-21 MED ORDER — MORPHINE SULFATE (CONCENTRATE) 10 MG /0.5 ML PO SOLN: 10.0000 mg | ORAL | 0 refills | Status: AC | PRN

## 2017-01-21 NOTE — Progress Notes (Signed)
Clinical Education officer, museum ([place]) contacted Surveyor, quantity for [employer]. Per Tammy patient is in the memory care [facility] building located at [employer]. [employer] (fax: [phone]). Per Tammy patient has been at [facility] for 4-5 years and primarily uses the wheel chair. Per Tammy patient is on room air at baseline. Per Tammy patient's PCP signed an order to start hospice services at [facility] however it never started because patient went to the hospital. Per Tammy patient can return to [facility] memory care when stable.   [employer], LCSW [phone]

## 2017-01-21 NOTE — Care Management Important Message (Addendum)
Important Message  Patient Details  Name: Sarah [name] MRN: [id number] Date of Birth: 20-Oct-1924   Medicare Important Message Given:  Yes  Son signed    Shelbie Ammons, RN 01/21/2017, 10:36 AM

## 2017-01-21 NOTE — Progress Notes (Addendum)
Daily Progress Note   Patient Name: Sarah [name]       Date: 01/21/2017 DOB: 10-20-1924  Age: 83 y.o. MRN#: [id number] Attending Physician: Hillary Bow, MD Primary Care Physician: Raelyn Number, MD Admit Date: 01/18/2017  Reason for Consultation/Follow-up: Establishing goals of care  Subjective: [name] is resting in bed, she is sleeping. She says "no" when asked if she has pain. She falls back to sleep. She did not eat dinner and has not eaten breakfast. When asked if she was hungry, she said "no". Per nursing, she has not eaten since last visit yesterday. Per SLP, she showed regression in evaluation yesterday. Spoke with [name], discussed hospice vs aggressive care, he states he is okay with inpatient hospice care. Dr. Darvin Neighbours updated. Message left for [facility].   Length of Stay: 3  Current Medications: Scheduled Meds:  . aspirin EC  81 mg Oral Daily  . cholecalciferol  1,000 Units Oral Daily  . donepezil  5 mg Oral QHS  . enoxaparin (LOVENOX) injection  40 mg Subcutaneous Q24H  . escitalopram  10 mg Oral QHS  . mouth rinse  15 mL Mouth Rinse BID  . memantine  10 mg Oral BID  . methimazole  10 mg Oral Daily  . mirtazapine  7.5 mg Oral QHS  . multivitamin-lutein  1 capsule Oral QHS  . potassium chloride SA  20 mEq Oral Daily  . risperiDONE  0.25 mg Oral BID    Continuous Infusions: . ampicillin-sulbactam (UNASYN) IV Stopped (01/21/17 0529)  . azithromycin Stopped (01/20/17 1203)    PRN Meds: acetaminophen, albuterol, loperamide, ondansetron, polyethylene glycol, traZODone  Physical Exam  Constitutional: No distress.  Eyes opened briefly to answer "no" to several questions.   Pulmonary/Chest: Effort normal.  Skin: Skin is warm and dry.            Vital Signs: BP (!)  128/45   Pulse 68   Temp 99.5 F (37.5 C) (Oral)   Resp 19   Ht 5\' 2"  (1.575 m)   Wt 57.7 kg (127 lb 3.2 oz)   SpO2 99%   BMI 23.27 kg/m  SpO2: SpO2: 99 % O2 Device: O2 Device: Nasal Cannula O2 Flow Rate: O2 Flow Rate (L/min): 2 L/min  Intake/output summary:   Intake/Output Summary (Last 24 hours) at 01/21/2017 6160  Last data filed at 01/20/2017 1700 Gross per 24 hour  Intake 550 ml  Output -  Net 550 ml   LBM: Last BM Date: 01/19/17 Baseline Weight: Weight: 60 kg (132 lb 4.4 oz) Most recent weight: Weight: 57.7 kg (127 lb 3.2 oz)       Palliative Assessment/Data: 10%      Patient Active Problem List   Diagnosis Date Noted  . Pressure injury of skin 01/19/2017  . Acute respiratory failure with hypoxia ([place]) 01/18/2017  . Mass of upper lobe of right lung 07/11/2016  . CVA (cerebral vascular accident) ([place]) 07/02/2016    Palliative Care Assessment & Plan   Patient Profile: HelenBrownis a89 y.o.femalewith a known history of chronic Alzheimer's dementia, hypertension, living in a facility is brought into the ED for acute hypoxia. Patient is legally blind.   Assessment: [name] is sleepy and lethargic this morning.   Recommendations/Plan:  Inpatient hospice facility. Family does not want patient to return to current facility.   Goals of Care and Additional Recommendations:  Limitations on Scope of Treatment: Full Comfort Care  Code Status:    Code Status Orders  (From admission, onward)        Start     Ordered   01/18/17 1245  Do not attempt resuscitation (DNR)  Continuous    Question Answer Comment  In the event of cardiac or respiratory ARREST Do not call a "code blue"   In the event of cardiac or respiratory ARREST Do not perform Intubation, CPR, defibrillation or ACLS   In the event of cardiac or respiratory ARREST Use medication by any route, position, wound care, and other measures to relive pain and suffering. May use oxygen, suction and  manual treatment of airway obstruction as needed for comfort.   Comments RN may pronounce      01/18/17 1244    Code Status History    Date Active Date Inactive Code Status Order ID Comments User Context   07/02/2016 21:01 07/04/2016 16:52 DNR [id number]  Henreitta Leber, MD Inpatient       Prognosis:   < 2 weeks Swallowing difficulty,difficulty with swallowing oral secretions, lack of hunger or thirst. Albumin 2.8. PNA. Dementia. Skin breakdown. Sleepiness and lethargy.   Discharge Planning:  Hospice facility  Care plan was discussed with Dr. Darvin Neighbours and Mel Almond  Thank you for allowing the Palliative Medicine Team to assist in the care of this patient.   Total Time 35 min Prolonged Time Billed No      Greater than 50%  of this time was spent counseling and coordinating care related to the above assessment and plan.  Asencion Gowda, NP 01/21/2017 10:08 AM Office: [phone] 7am-7pm  Please see Amion for pager number and availability  Call primary team after hours  Please contact Palliative Medicine Team phone at [phone] for questions and concerns.

## 2017-01-21 NOTE — Plan of Care (Signed)
Pt being d/ced to Hospice Home.  She is very lethargic but still responds to voice sometimes.  She has stated that she isn't experiencing pain and shows no s/sx of discomfort.  Medications not given orally for fear of aspiration. She has had very poor PO intake for last few days.  She has a weak cough and occasionally needs suctioning.  When son and granddaughters came in, I spoke of the benefits of hospice home both to the patient and family.  EMS has been called for transport and report called to hospice home by Flo Shanks.

## 2017-01-21 NOTE — Progress Notes (Signed)
Nutrition Brief Note  Chart reviewed. Pt now transitioning to comfort care.  No further nutrition interventions warranted at this time.  Please re-consult as needed.   Sarah [name]. Kyle Stansell, MS, RD LDN Inpatient Clinical Dietitian Pager [phone]

## 2017-01-21 NOTE — Consult Note (Signed)
   [facility] Inpatient Consult   01/21/2017  Sarah [name] 1924-06-15 [id number]  Patient screened for potential [facility] Management services. Patient is on the [place] registry as a benefit of their [employer] . Electronic medical record reveals patient has transitioned to Hospice care. [facility] Care Management services not appropriate at this time. If patient's post hospital needs change please place a [facility] Care Management consult. For questions please contact:   Carlisle Enke RN, [facility] Liaison  [phone]) Business Mobile [phone]) Toll free office

## 2017-01-21 NOTE — Progress Notes (Signed)
Clinical Education officer, museum (CSW) received consult from Palliative NP for hospice facility placement. CSW contacted patient's son Sarah [name] and provided hospice choice. Son chose [facility] in [place]. Per [facility] liaison they can accept patient today. CSW prepared D/C packet. Please reconsult if future social work needs arise. CSW signing off.   [employer], LCSW [phone]

## 2017-01-21 NOTE — Progress Notes (Signed)
New Hospice home referral received from [place] following a Palliative Medicine consult. Patient is a 83 year old woman with a  known history of Alzheimer's disease dementia, HTN and macular degeneration admitted to [facility] on 12/2 for treatment of acute hypoxic respiratory failure secondary to acute bronchitis and pneumonia. Chest xray also revealed a right upper lobe mass which has been seen in the past. She has continued to decline despite medical interventions and Palliative Medicine was consulted for goals of care and met with her son [name], he has chosen to focus on her comfort with transfer to the hospice home. Patient has required robinul for secretions and per chart note review appears to be aspirating. Writer met in the room with [name] and his daughter Ander Purpura to initiate education regarding hospice services, philosophy and team approach to care with understanding voiced. Questions answered, consents signed. Patient information faxed to referral. Report called to the hospice home. Signed DNR in place in discharge packet. Hospital team updated. EMS notified for transport. Thank you. Flo Shanks RN, BSN, [place] [facility], hospital Liaison [phone]

## 2017-01-21 NOTE — Discharge Summary (Signed)
[place] [facility] [place] NAME: Sarah Sarah [name]    MR#:  [id number]  DATE OF BIRTH:  March 11, 1924  DATE OF ADMISSION:  01/18/2017 ADMITTING PHYSICIAN: Nicholes Mango, MD  DATE OF DISCHARGE: 01/21/2017  PRIMARY CARE PHYSICIAN: Raelyn Number, MD   ADMISSION DIAGNOSIS:  Cough [R05] Dyspnea, unspecified type [R06.00] Acute respiratory failure with Sarah [name] ([name]) [J96.01]  DISCHARGE DIAGNOSIS:  Active Problems:   Acute respiratory failure with Sarah [name] (HCC)   Pressure injury of skin   SECONDARY DIAGNOSIS:   Past Medical History:  Diagnosis Date  . Sarah [name]   . Fall   . Hyperlipidemia   . Sarah Sarah [name]   . Macular degeneration   . Osteoporosis      ADMITTING HISTORY  HISTORY OF PRESENT ILLNESS:  Sarah Sarah [name]  is a 83 y.o. female with a known history of chronic Alzheimer's Sarah [name], Sarah Sarah [name], Sarah Sarah [name]. Patient's pulse ox was at 80% on room air. Chest x-ray did not reveal any infiltrate but has revealed right upper lobe mass. Patient's son who is the healthcare power of attorney  is aware of the mass and doesn't want to pursue any other investigations as patient is 83 year old patient is a poor historian  HOSPITAL COURSE:   Sarah Sarah [name], Sarah Sarah [name], Sarah Sarah [name]. Patient's pulse ox was at 80% on room air  #Coagulase-negative staph in blood cultures likely contaminant # Acute hypoxic respiratory failure due to aspiration pneumonia #Essential hypertensione #Chronic Alzheimer's Sarah [name] #Right upper lobe mass-likely cancer  # Acute toxic and metabolic encephalopathy over Sarah [name]  Patient was admitted to medical floor.  Started on IV antibiotics with azithromycin and ceftriaxone initially.  Later changed to vancomycin due to blood cultures showing coag negative staph  which was later thought to be contaminant.  Unasyn added due to concern for aspiration.  Patient was seen by speech therapy and made n.p.o. as patient was too drowsy to swallow and with her worsening Sarah [name] and dysphagia was unable to meet her nutritional needs.  Palliative care was consulted.  After discussing with the family patient was transitioned to comfort care and discharged to hospice home for end-of-life care.   CONSULTS OBTAINED:  Treatment Team:  Sarah Ramsay, MD  DRUG ALLERGIES:  No Known Allergies  DISCHARGE MEDICATIONS:   Allergies as of 01/21/2017   No Known Allergies     Medication List    STOP taking these medications   Cranberry 450 MG Caps   donepezil 5 MG tablet Commonly known as:  ARICEPT   escitalopram 10 MG tablet Commonly known as:  LEXAPRO   hydrochlorothiazide 25 MG tablet Commonly known as:  HYDRODIURIL   loperamide 2 MG capsule Commonly known as:  IMODIUM   memantine 10 MG tablet Commonly known as:  NAMENDA   methimazole 10 MG tablet Commonly known as:  TAPAZOLE   mirtazapine 15 MG tablet Commonly known as:  REMERON   ondansetron 4 MG disintegrating tablet Commonly known as:  ZOFRAN ODT   ondansetron 4 MG tablet Commonly known as:  ZOFRAN   potassium chloride SA 20 MEQ tablet Commonly known as:  K-DUR,KLOR-CON   PRESERVISION AREDS Tabs   risperiDONE 0.25 MG tablet Commonly known as:  RISPERDAL   traZODone 50 MG tablet Commonly known as:  DESYREL   VITAMIN D-1000 MAX ST 1000 units tablet Generic drug:  Cholecalciferol     TAKE these medications   acetaminophen 500 MG tablet Commonly known as:  TYLENOL Take 500 mg by mouth 2 (two) times daily.   acetaminophen 325 MG tablet Commonly known as:  TYLENOL Take 650 mg by mouth every 4 (four) hours as needed for mild pain, fever or headache.   amLODipine 10 MG tablet Commonly known as:  NORVASC Take 10 mg by mouth daily.   aspirin 81 MG EC tablet Take 1 tablet (81  mg total) by mouth daily.   bacitracin ointment Apply to affected area daily   glycopyrrolate 0.2 MG/ML injection Commonly known as:  ROBINUL Inject 0.5 mLs (0.1 mg total) into the vein 3 (three) times daily.   morphine CONCENTRATE 10 mg / 0.5 ml concentrated solution Take 0.5 mLs (10 mg total) by mouth every 3 (three) hours as needed for moderate pain, severe pain or shortness of breath.       Today   VITAL SIGNS:  Blood pressure (!) 128/45, pulse 68, temperature 99.5 F (37.5 C), temperature source Oral, resp. rate 19, height 5\' 2"  (1.575 m), weight 57.7 kg (127 lb 3.2 oz), SpO2 99 %.  I/O:    Intake/Output Summary (Last 24 hours) at 01/21/2017 1127 Last data filed at 01/20/2017 1700 Gross per 24 hour  Intake 550 ml  Output -  Net 550 ml    PHYSICAL EXAMINATION:  Physical Exam  GENERAL:  83 y.o.-year-old patient lying in the bed LUNGS: coarse breath sounds CARDIOVASCULAR: S1, S2 ABDOMEN: Soft, non-tender NEUROLOGIC: doesn't follow commands PSYCHIATRIC: The patient is drowzy  DATA REVIEW:   CBC Recent Labs  Lab 01/19/17 0423  WBC 9.7  HGB 10.0*  HCT 30.4*  PLT 237    Chemistries  Recent Labs  Lab 01/19/17 0423 01/21/17 0415  NA 144  --   K 3.9  --   CL 110  --   CO2 27  --   GLUCOSE 108*  --   BUN 20  --   CREATININE 0.82 0.84  CALCIUM 8.4*  --   AST 24  --   ALT 15  --   ALKPHOS 92  --   BILITOT 0.3  --     Cardiac Enzymes Recent Labs  Lab 01/18/17 0800  TROPONINI 0.04*    Microbiology Results  Results for orders placed or performed during the hospital encounter of 01/18/17  Culture, blood (Routine x 2)     Status: Abnormal   Collection Time: 01/18/17  7:04 AM  Result Value Ref Range Status   Specimen Description BLOOD BLOOD RIGHT HAND  Final   Special Requests   Final    BOTTLES DRAWN AEROBIC AND ANAEROBIC Blood Culture adequate volume   Culture  Setup Time   Final    GRAM POSITIVE COCCI IN BOTH AEROBIC AND ANAEROBIC  BOTTLES CRITICAL RESULT CALLED TO, READ BACK BY AND VERIFIED WITH: MATT MCBANE AT 1638 ON 01/19/17 BY QSD.Marland KitchenMarland KitchenAdventhealth Waterman    Culture STAPHYLOCOCCUS SPECIES (COAGULASE NEGATIVE) (A)  Final   Report Status 01/21/2017 FINAL  Final   Organism ID, Bacteria STAPHYLOCOCCUS SPECIES (COAGULASE NEGATIVE)  Final      Susceptibility   Staphylococcus species (coagulase negative) - MIC*    CIPROFLOXACIN <=0.5 SENSITIVE Sensitive     ERYTHROMYCIN >=8 RESISTANT Resistant     GENTAMICIN 1 SENSITIVE Sensitive     OXACILLIN >=4 RESISTANT Resistant     TETRACYCLINE <=1 SENSITIVE Sensitive     VANCOMYCIN 1 SENSITIVE Sensitive  TRIMETH/SULFA 80 RESISTANT Resistant     CLINDAMYCIN >=8 RESISTANT Resistant     RIFAMPIN <=0.5 SENSITIVE Sensitive     Inducible Clindamycin NEGATIVE Sensitive     * STAPHYLOCOCCUS SPECIES (COAGULASE NEGATIVE)  Blood Culture ID Panel (Reflexed)     Status: Abnormal   Collection Time: 01/18/17  7:04 AM  Result Value Ref Range Status   Enterococcus species NOT DETECTED NOT DETECTED Final   Listeria monocytogenes NOT DETECTED NOT DETECTED Final   Staphylococcus species DETECTED (A) NOT DETECTED Final    Comment: Methicillin (oxacillin) resistant coagulase negative staphylococcus. Possible blood culture contaminant (unless isolated from more than one blood culture draw or clinical case suggests pathogenicity). No antibiotic treatment is indicated for blood  culture contaminants. CRITICAL RESULT CALLED TO, READ BACK BY AND VERIFIED WITH: MATT MCBANE ON 01/19/17 AT 0523 QSD    Staphylococcus aureus NOT DETECTED NOT DETECTED Final   Methicillin resistance DETECTED (A) NOT DETECTED Final    Comment: CRITICAL RESULT CALLED TO, READ BACK BY AND VERIFIED WITH: MATT MCBANE ON 01/19/17 AT 0523 QSD    Streptococcus species NOT DETECTED NOT DETECTED Final   Streptococcus agalactiae NOT DETECTED NOT DETECTED Final   Streptococcus pneumoniae NOT DETECTED NOT DETECTED Final   Streptococcus pyogenes NOT  DETECTED NOT DETECTED Final   Acinetobacter baumannii NOT DETECTED NOT DETECTED Final   Enterobacteriaceae species NOT DETECTED NOT DETECTED Final   Enterobacter cloacae complex NOT DETECTED NOT DETECTED Final   Escherichia coli NOT DETECTED NOT DETECTED Final   Klebsiella oxytoca NOT DETECTED NOT DETECTED Final   Klebsiella pneumoniae NOT DETECTED NOT DETECTED Final   Proteus species NOT DETECTED NOT DETECTED Final   Serratia marcescens NOT DETECTED NOT DETECTED Final   Haemophilus influenzae NOT DETECTED NOT DETECTED Final   Neisseria meningitidis NOT DETECTED NOT DETECTED Final   Pseudomonas aeruginosa NOT DETECTED NOT DETECTED Final   Candida albicans NOT DETECTED NOT DETECTED Final   Candida glabrata NOT DETECTED NOT DETECTED Final   Candida krusei NOT DETECTED NOT DETECTED Final   Candida parapsilosis NOT DETECTED NOT DETECTED Final   Candida tropicalis NOT DETECTED NOT DETECTED Final  Culture, blood (Routine x 2)     Status: Abnormal   Collection Time: 01/18/17  7:10 AM  Result Value Ref Range Status   Specimen Description BLOOD BLOOD LEFT FOREARM  Final   Special Requests   Final    BOTTLES DRAWN AEROBIC AND ANAEROBIC Blood Culture adequate volume   Culture  Setup Time   Final    GRAM POSITIVE COCCI IN BOTH AEROBIC AND ANAEROBIC BOTTLES CRITICAL VALUE NOTED.  VALUE IS CONSISTENT WITH PREVIOUSLY REPORTED AND CALLED VALUE.    Culture (A)  Final    STAPHYLOCOCCUS SPECIES (COAGULASE NEGATIVE) SUSCEPTIBILITIES PERFORMED ON PREVIOUS CULTURE WITHIN THE LAST 5 DAYS. Performed at [facility], [place] [address] [place], [place] [address]    Report Status 01/21/2017 FINAL  Final  Urine Culture     Status: Abnormal   Collection Time: 01/18/17  7:17 AM  Result Value Ref Range Status   Specimen Description URINE, CLEAN CATCH  Final   Special Requests Normal  Final   Culture MULTIPLE SPECIES PRESENT, SUGGEST RECOLLECTION (A)  Final   Report Status 01/20/2017 FINAL  Final  MRSA  PCR Screening     Status: None   Collection Time: 01/18/17  4:17 PM  Result Value Ref Range Status   MRSA by PCR NEGATIVE NEGATIVE Final    Comment:  The GeneXpert MRSA Assay (FDA approved for NASAL specimens only), is one component of a comprehensive MRSA colonization surveillance program. It is not intended to diagnose MRSA infection nor to guide or monitor treatment for MRSA infections.   Culture, blood (single) w Reflex to ID Panel     Status: None (Preliminary result)   Collection Time: 01/20/17  3:31 PM  Result Value Ref Range Status   Specimen Description BLOOD RIGHT ANTECUBITAL  Final   Special Requests   Final    BOTTLES DRAWN AEROBIC AND ANAEROBIC Blood Culture adequate volume   Culture NO GROWTH < 24 HOURS  Final   Report Status PENDING  Incomplete    RADIOLOGY:  No results found.  Follow up with PCP in 1 week.  Management plans discussed with the patient, family and they are in agreement.  CODE STATUS:     Code Status Orders  (From admission, onward)        Start     Ordered   01/18/17 1245  Do not attempt resuscitation (DNR)  Continuous    Question Answer Comment  In the event of cardiac or respiratory ARREST Do not call a "code blue"   In the event of cardiac or respiratory ARREST Do not perform Intubation, CPR, defibrillation or ACLS   In the event of cardiac or respiratory ARREST Use medication by any route, position, wound care, and other measures to relive pain and suffering. May use oxygen, suction and manual treatment of airway obstruction as needed for comfort.   Comments RN may pronounce      01/18/17 1244    Code Status History    Date Active Date Inactive Code Status Order ID Comments User Context   07/02/2016 21:01 07/04/2016 16:52 DNR [id number]  Henreitta Leber, MD Inpatient      TOTAL TIME TAKING CARE OF THIS PATIENT ON DAY OF DISCHARGE: more than 30 minutes.   Leia Alf Taylore Hinde M.D on 01/21/2017 at 11:27 AM  Between 7am to 6pm  - Pager - [phone]  After 6pm go to [email or web address] - password [email or web address] [facility]  Office  [phone]  CC: Primary care physician; Raelyn Number, MD  Note: This dictation was prepared with Dragon dictation along with smaller phrase technology. Any transcriptional errors that result from this process are unintentional.

## 2017-01-25 LAB — CULTURE, BLOOD (SINGLE)
CULTURE: NO GROWTH
SPECIAL REQUESTS: ADEQUATE

## 2017-02-11 DIAGNOSIS — R32 Unspecified urinary incontinence: Secondary | ICD-10-CM | POA: Diagnosis not present

## 2017-02-11 DIAGNOSIS — F0390 Unspecified dementia without behavioral disturbance: Secondary | ICD-10-CM | POA: Diagnosis not present

## 2017-02-17 DEATH — deceased

## 2019-03-26 IMAGING — CT CT HEAD W/O CM
3 series · 15 of 47 positions shown, 18 images · non-contrast
Comparison: MRI 07/03/2016, 07/02/2016

CLINICAL DATA: Nausea emesis and hypertension

EXAM:
CT HEAD WITHOUT CONTRAST
TECHNIQUE: Contiguous axial images were obtained from the base of the skull
through the vertex without intravenous contrast.

[Series 2: head wo · axial · 0.39mm/px · z∈[-156,-31]mm · 9 of 30 slices shown, 12 images]
[im 3/30  brain]
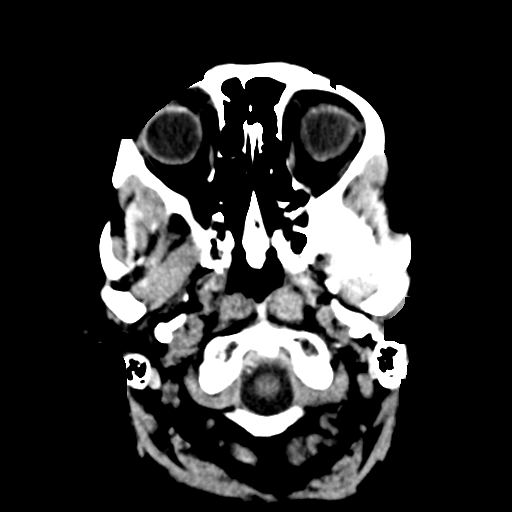
[im 3/30  bone]
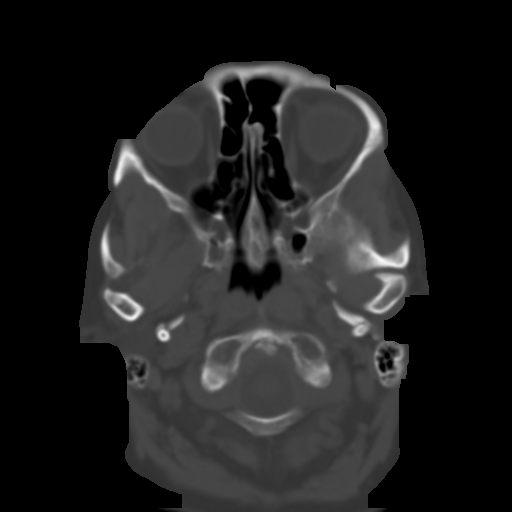
[im 6/30  brain]
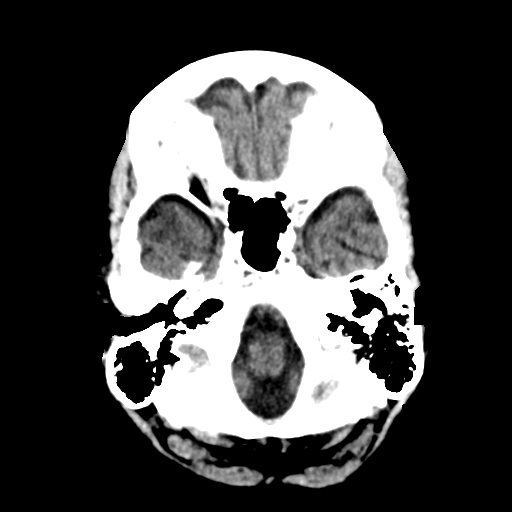
[im 9/30  brain]
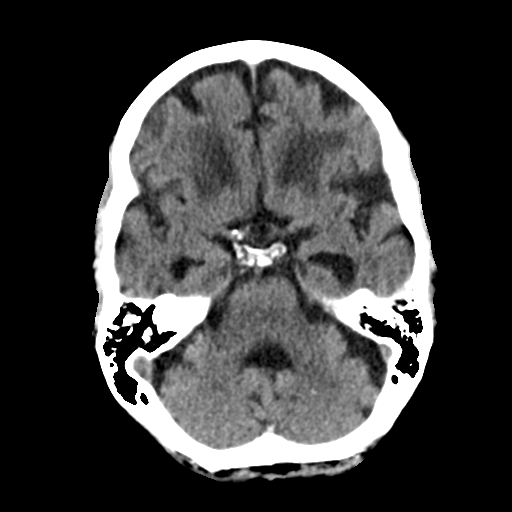
[im 12/30  brain]
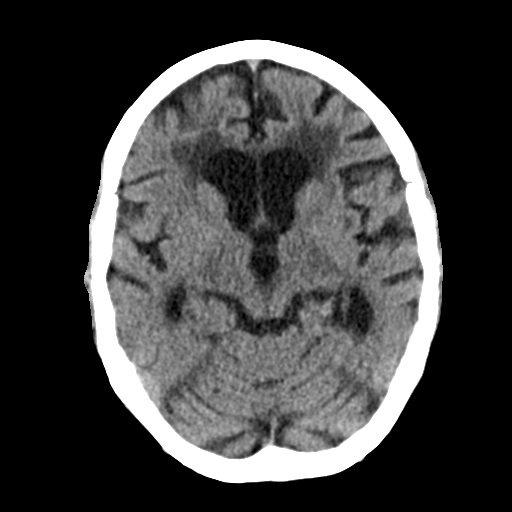
[im 16/30  brain]
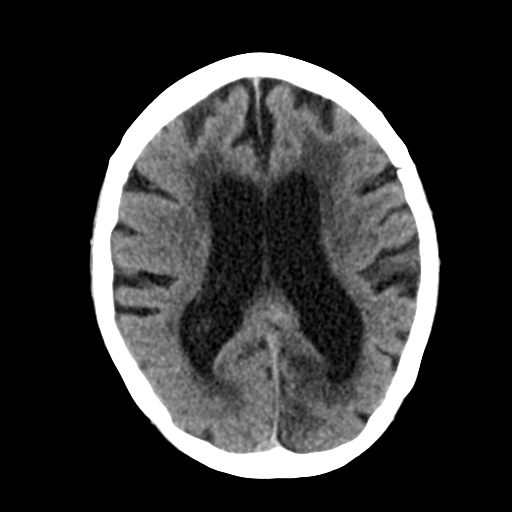
[im 16/30  bone]
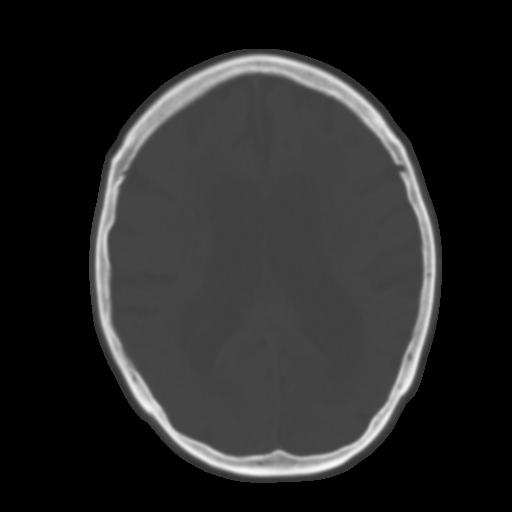
[im 19/30  brain]
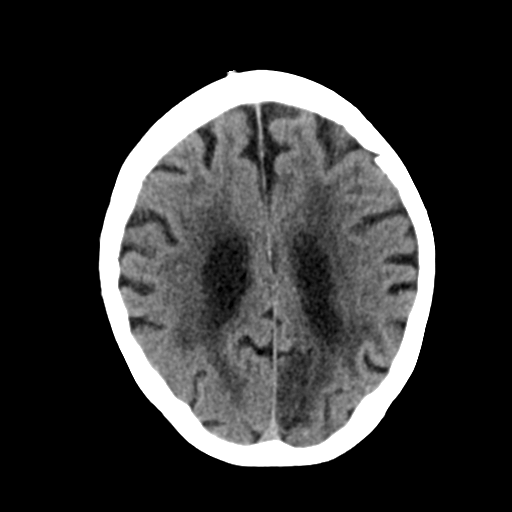
[im 22/30  brain]
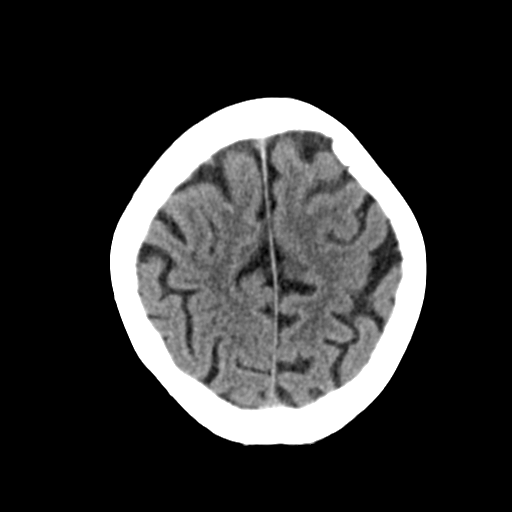
[im 25/30  brain]
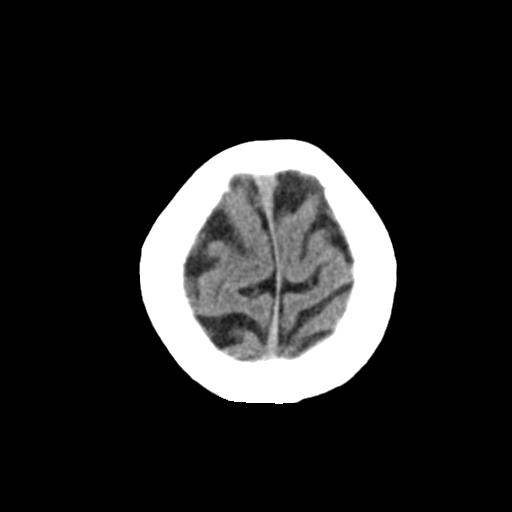
[im 28/30  brain]
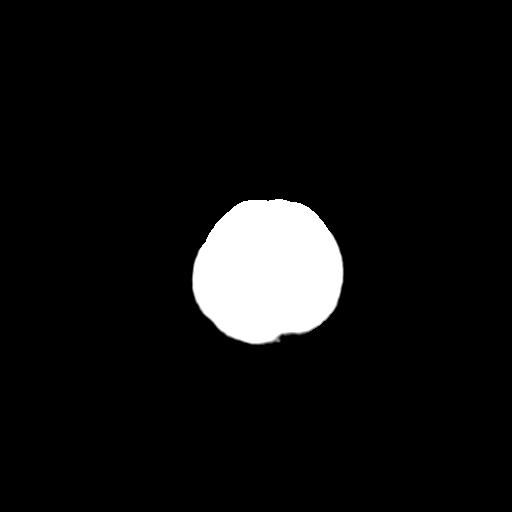
[im 28/30  bone]
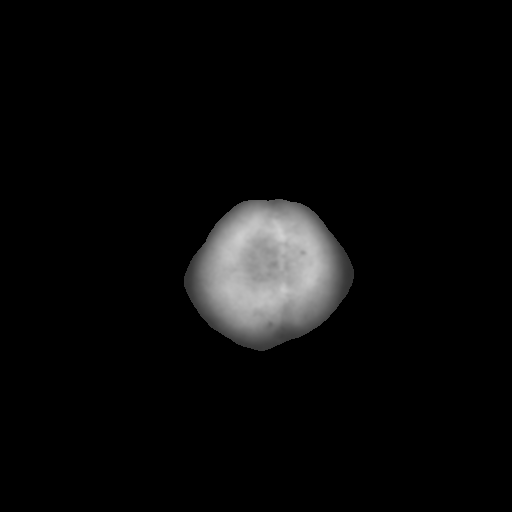

[Series 4: coronal soft tissue · coronal · 0.30mm/px · 3 of 67 slices shown]
[im 23/67  brain]
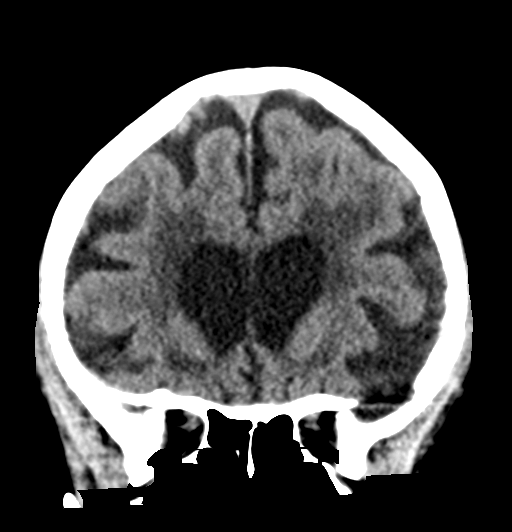
[im 30/67  brain]
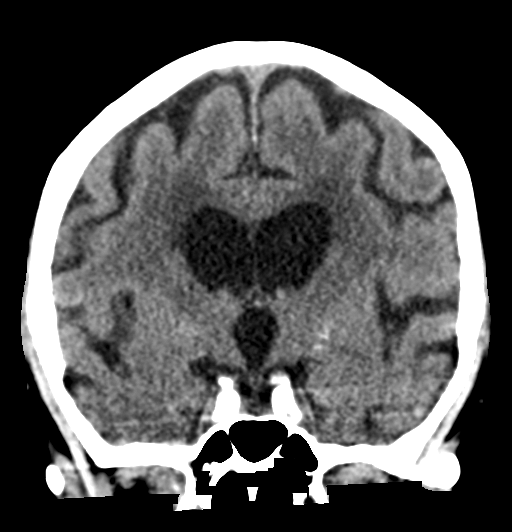
[im 37/67  brain]
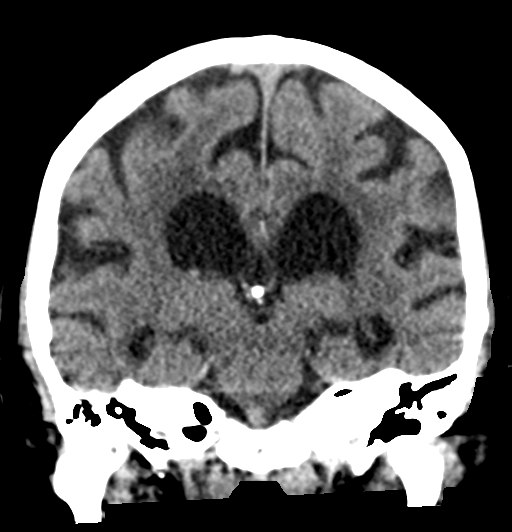

[Series 5: sagittal soft tissue · sagittal · 0.28mm/px · 3 of 50 slices shown]
[im 17/50  brain]
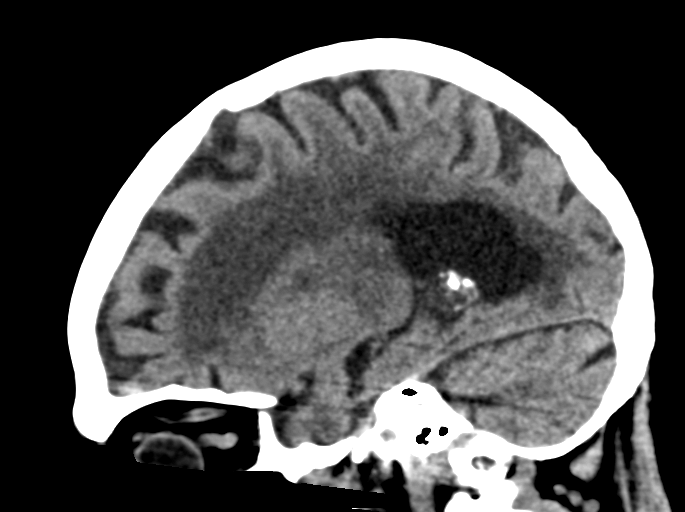
[im 25/50  brain]
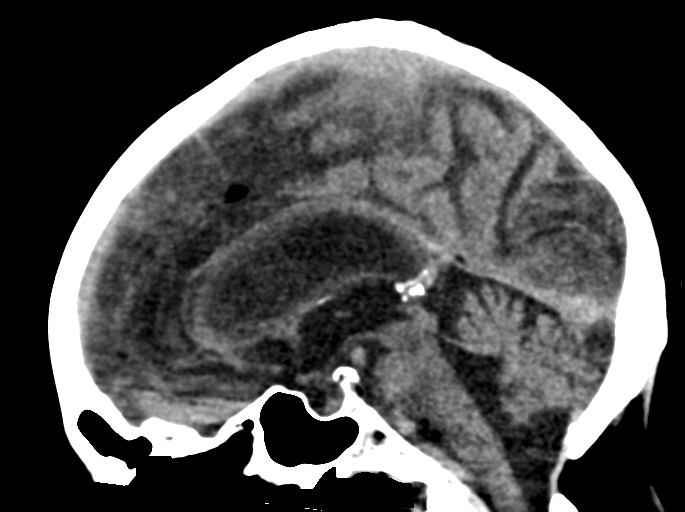
[im 33/50  brain]
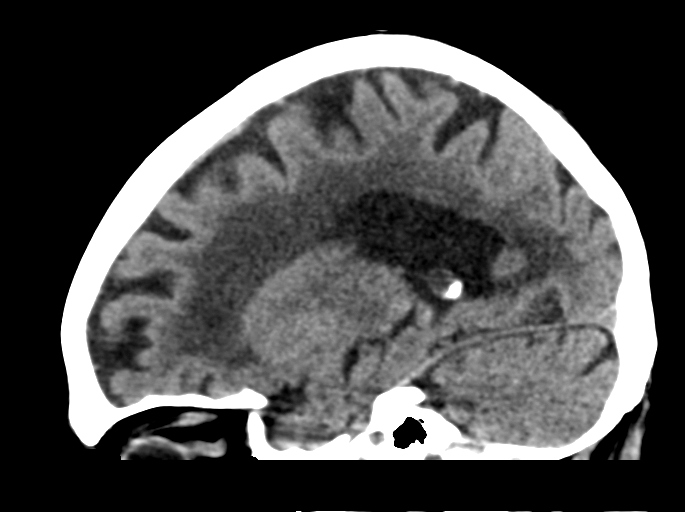

[15 of 47 positions shown; findings below may reference images not displayed]

FINDINGS: Brain: No acute territorial infarction, hemorrhage, or intracranial
mass is seen. Old left occipital infarct. Extensive periventricular
and subcortical white matter small vessel ischemic changes. Moderate
atrophy. Stable enlarged ventricles likely related to atrophy.

Vascular: No hyperdense vessels. Carotid artery calcifications.
Vertebral artery calcifications.

Skull: No fracture or suspicious bone lesion.

Sinuses/Orbits: No acute abnormality.

Other: None
IMPRESSION: 1. No CT evidence for acute intracranial abnormality
2. Old left occipital infarct. Extensive periventricular and
subcortical white matter small vessel ischemic changes along with
atrophy.
